# Patient Record
Sex: Female | Born: 1995 | Race: White | Hispanic: No | Marital: Married | Smoking: Never smoker
Health system: Southern US, Community
[De-identification: ages and names within clinical notes are randomized; demographics above are authoritative.]

## PROBLEM LIST (undated history)

## (undated) ENCOUNTER — Inpatient Hospital Stay (HOSPITAL_COMMUNITY): Payer: Self-pay

## (undated) DIAGNOSIS — D649 Anemia, unspecified: Secondary | ICD-10-CM

## (undated) DIAGNOSIS — G43909 Migraine, unspecified, not intractable, without status migrainosus: Secondary | ICD-10-CM

## (undated) DIAGNOSIS — Z789 Other specified health status: Secondary | ICD-10-CM

## (undated) DIAGNOSIS — K219 Gastro-esophageal reflux disease without esophagitis: Secondary | ICD-10-CM

## (undated) HISTORY — PX: NO PAST SURGERIES: SHX2092

---
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
## 2014-06-06 ENCOUNTER — Encounter (HOSPITAL_COMMUNITY): Payer: Self-pay | Admitting: Emergency Medicine

## 2014-06-06 ENCOUNTER — Emergency Department (INDEPENDENT_AMBULATORY_CARE_PROVIDER_SITE_OTHER)
Admission: EM | Admit: 2014-06-06 | Discharge: 2014-06-06 | Disposition: A | Discharge status: Home / Self Care | Payer: Self-pay | Source: Home / Self Care | Attending: Emergency Medicine | Admitting: Emergency Medicine

## 2014-06-06 DIAGNOSIS — J111 Influenza due to unidentified influenza virus with other respiratory manifestations: Secondary | ICD-10-CM

## 2014-06-06 MED ORDER — OSELTAMIVIR PHOSPHATE 75 MG PO CAPS: 75.0000 mg | ORAL_CAPSULE | Freq: Two times a day (BID) | ORAL | Status: DC

## 2014-06-06 NOTE — ED Notes (Signed)
C/o  Acute on set of flu symptoms.  C/o  Cough. Headache. Congestion.  Fever of 102.  And body aches.  Denies diarrhea.    Mild relief with tylenol.

## 2014-06-06 NOTE — Discharge Instructions (Signed)
You have the flu. Take Tamiflu twice a day for the next 5 days. Drink plenty of fluids and get plenty of rest. Alternate Tylenol or ibuprofen every 4 hours for fever and body aches. If you become short of breath, or unable to keep down fluids, or are just getting worse please go to the emergency room.

## 2014-06-06 NOTE — ED Provider Notes (Signed)
CSN: [id number]     Arrival date & time 06/06/14  1622 History   First MD Initiated Contact with Patient 06/06/14 1814     Chief Complaint  Patient presents with  . Influenza   (Consider location/radiation/quality/duration/timing/severity/associated sxs/prior Treatment) HPI  She is a 19 year old woman here for evaluation of flulike symptoms. Her symptoms started yesterday and include cough, nasal congestion, rhinorrhea, sore throat, headache, body aches. She has had fever up to 102.7 at home. She has had some nausea.  No vomiting or diarrhea.  History reviewed. No pertinent past medical history. History reviewed. No pertinent past surgical history. History reviewed. No pertinent family history. History  Substance Use Topics  . Smoking status: Never Smoker   . Smokeless tobacco: Not on file  . Alcohol Use: No   OB History    No data available     Review of Systems  Constitutional: Positive for fever and appetite change.  HENT: Positive for congestion, rhinorrhea and sore throat.   Respiratory: Positive for cough. Negative for shortness of breath.   Gastrointestinal: Positive for nausea. Negative for vomiting and diarrhea.  Musculoskeletal: Positive for myalgias.  Neurological: Positive for headaches.    Allergies  Penicillins  Home Medications   Prior to Admission medications   Medication Sig Start Date End Date Taking? Authorizing Provider  oseltamivir (TAMIFLU) 75 MG capsule Take 1 capsule (75 mg total) by mouth every 12 (twelve) hours. 06/06/14   Charm RingsErin J Honig, MD   BP 195/61 mmHg  Pulse 136  Temp(Src) 100.6 F (38.1 C) (Oral)  Resp 12  SpO2 97%  LMP 05/11/2014 Physical Exam  Constitutional: She is oriented to person, place, and time. She appears well-developed and well-nourished. She appears distressed (looks ill).  HENT:  Head: Normocephalic and atraumatic.  Right Ear: External ear normal.  Left Ear: External ear normal.  Nose: Nose normal.  Mouth/Throat:  Oropharynx is clear and moist. No oropharyngeal exudate.  Neck: Neck supple.  Cardiovascular: Regular rhythm and normal heart sounds.  Tachycardia present.   No murmur heard. Pulmonary/Chest: Effort normal and breath sounds normal. No respiratory distress. She has no wheezes. She has no rales.  Lymphadenopathy:    She has no cervical adenopathy.  Neurological: She is alert and oriented to person, place, and time.    ED Course  Procedures (including critical care time) Labs Review Labs Reviewed - No data to display  Imaging Review No results found.   MDM   1. Influenza    Symptoms are consistent with flu. Will treat with Tamiflu. Tylenol and ibuprofen for fever and body aches. Warning signs reviewed as in after visit summary.    Charm RingsErin J Honig, MD 06/06/14 [phone]

## 2017-04-10 NOTE — L&D Delivery Note (Signed)
Delivery Note Pt became complete at 0451; after intermittent laboring down/pushing, at 9:16 AM a viable female was delivered via Vaginal, Spontaneous (Presentation: ROA).  APGAR: 7, 9; weight: pending. Infant dried and placed on pt's abd; cord clamped and cut by FOB; hospital cord blood sample collected. Placenta status: spont , intact .  Cord: 3 vessel  Anesthesia:  Epidural Episiotomy: None Lacerations: Labial- bilateral Suture Repair: 3.0 vicryl, repaired with 2-3 interrupted sutures each side Est. Blood Loss (mL): 300  Mom to postpartum.  Baby to Couplet care / Skin to Skin.  Cam HaiSHAW, KIMBERLY CNM 10/08/2017, 10:07 AM  Please schedule this patient for Postpartum visit in: 4 weeks with the following provider: Any provider For C/S patients schedule nurse incision check in weeks 2 weeks: no Low risk pregnancy complicated by: none Delivery mode:  SVD Anticipated Birth Control:  POPs vs Depo PP Procedures needed: none  Schedule Integrated BH visit: no

## 2017-04-25 ENCOUNTER — Inpatient Hospital Stay (HOSPITAL_COMMUNITY)
Admission: AD | Admit: 2017-04-25 | Discharge: 2017-04-25 | Disposition: A | Discharge status: Home / Self Care | Payer: Medicaid Other | Source: Ambulatory Visit | Attending: Family Medicine | Admitting: Family Medicine

## 2017-04-25 ENCOUNTER — Encounter (HOSPITAL_COMMUNITY): Payer: Self-pay | Admitting: *Deleted

## 2017-04-25 DIAGNOSIS — O26892 Other specified pregnancy related conditions, second trimester: Secondary | ICD-10-CM | POA: Insufficient documentation

## 2017-04-25 DIAGNOSIS — N949 Unspecified condition associated with female genital organs and menstrual cycle: Secondary | ICD-10-CM

## 2017-04-25 DIAGNOSIS — R109 Unspecified abdominal pain: Secondary | ICD-10-CM | POA: Diagnosis not present

## 2017-04-25 DIAGNOSIS — R102 Pelvic and perineal pain: Secondary | ICD-10-CM | POA: Insufficient documentation

## 2017-04-25 DIAGNOSIS — Z3A17 17 weeks gestation of pregnancy: Secondary | ICD-10-CM | POA: Insufficient documentation

## 2017-04-25 DIAGNOSIS — Z880 Allergy status to penicillin: Secondary | ICD-10-CM | POA: Insufficient documentation

## 2017-04-25 HISTORY — DX: Other specified health status: Z78.9

## 2017-04-25 LAB — URINALYSIS, ROUTINE W REFLEX MICROSCOPIC
Bilirubin Urine: NEGATIVE
Glucose, UA: NEGATIVE mg/dL
Hgb urine dipstick: NEGATIVE
Ketones, ur: NEGATIVE mg/dL
Leukocytes, UA: NEGATIVE
Nitrite: NEGATIVE
Protein, ur: NEGATIVE mg/dL
Specific Gravity, Urine: 1.01 (ref 1.005–1.030)
pH: 7 (ref 5.0–8.0)

## 2017-04-25 NOTE — MAU Provider Note (Signed)
History     CSN: [id number]  Arrival date and time: 04/25/17 1344   First Provider Initiated Contact with Patient 04/25/17 1541      Chief Complaint  Patient presents with  . Abdominal Pain  . Back Pain   HPI   Ms.Helen White is a 22 y.o.female G1P0 @ [redacted]w[redacted]d here in MAU with complaints of lower abdominal pain. The pain started 4 days ago. The pain does not occur every day. The pain worsens when she is up moving around or changing positions in the bed.  No bleeding. The pain is located on both sides of her lower abdomen.  No bleeding or leaking of fluid.   OB History    Gravida Para Term Preterm AB Living   1             SAB TAB Ectopic Multiple Live Births                  Past Medical History:  Diagnosis Date  . Medical history non-contributory     Past Surgical History:  Procedure Laterality Date  . NO PAST SURGERIES      History reviewed. No pertinent family history.  Social History   Tobacco Use  . Smoking status: Never Smoker  . Smokeless tobacco: Never Used  Substance Use Topics  . Alcohol use: No  . Drug use: No    Allergies:  Allergies  Allergen Reactions  . Penicillins     Has patient had a PCN reaction causing immediate rash, facial/tongue/throat swelling, SOB or lightheadedness with hypotension: No Has patient had a PCN reaction causing severe rash involving mucus membranes or skin necrosis: No Has patient had a PCN reaction that required hospitalization: No Has patient had a PCN reaction occurring within the last 10 years: No If all of the above answers are "NO", then may proceed with Cephalosporin use.     Medications Prior to Admission  Medication Sig Dispense Refill Last Dose  . Docosahexaenoic Acid (DHA COMPLETE PO) Take 1 tablet by mouth daily.   04/24/2017 at Unknown time  . Prenatal Vit-Fe Fumarate-FA (PRENATAL MULTIVITAMIN) TABS tablet Take 1 tablet by mouth daily at 12 noon.   04/24/2017 at Unknown time  . acetaminophen  (TYLENOL) 325 MG tablet Take 650 mg by mouth every 6 (six) hours as needed for headache.    04/11/2017   Results for orders placed or performed during the hospital encounter of 04/25/17 (from the past 48 hour(s))  Urinalysis, Routine w reflex microscopic     Status: Abnormal   Collection Time: 04/25/17  2:04 PM  Result Value Ref Range   Color, Urine YELLOW YELLOW   APPearance HAZY (A) CLEAR   Specific Gravity, Urine 1.010 1.005 - 1.030   pH 7.0 5.0 - 8.0   Glucose, UA NEGATIVE NEGATIVE mg/dL   Hgb urine dipstick NEGATIVE NEGATIVE   Bilirubin Urine NEGATIVE NEGATIVE   Ketones, ur NEGATIVE NEGATIVE mg/dL   Protein, ur NEGATIVE NEGATIVE mg/dL   Nitrite NEGATIVE NEGATIVE   Leukocytes, UA NEGATIVE NEGATIVE   Review of Systems  Constitutional: Negative for fever.  Gastrointestinal: Positive for abdominal pain. Negative for constipation, diarrhea, nausea and vomiting.  Genitourinary: Negative for dysuria.   Physical Exam   Blood pressure (!) 109/55, pulse 89, temperature 98.7 F (37.1 C), temperature source Oral, resp. rate 16, height 5\' 3"  (1.6 m), weight 141 lb (64 kg), SpO2 100 %.  Physical Exam  Constitutional: She is oriented to person, place, and  time. She appears well-developed and well-nourished. No distress.  HENT:  Head: Normocephalic.  Eyes: Pupils are equal, round, and reactive to light.  GI: Soft. She exhibits no distension. There is no tenderness. There is no rebound.  Genitourinary:  Genitourinary Comments: Dilation: Closed Effacement (%): Thick Cervical Position: Posterior Exam by:: Cruzita Lipa, NP  Neurological: She is alert and oriented to person, place, and time.  Skin: Skin is warm. She is not diaphoretic.  Psychiatric: Her behavior is normal.   MAU Course  Procedures  None  MDM  + fetal heart tones via doppler Cervix closed   Assessment and Plan   A:  1. Round ligament pain   2. Abdominal pain in pregnancy, second trimester   3. [redacted] weeks gestation of  pregnancy     P:  Discharge home with strict return precautions Pregnancy support belt recommended. Return to MAU if symptoms worsen Follow up with OB as scheduled  Helen White, Harolyn RutherfordJennifer I, NP 04/25/2017 7:59 PM

## 2017-04-25 NOTE — Discharge Instructions (Signed)
Round Ligament Pain During Pregnancy   Round ligament pain is a sharp pain or jabbing feeling often felt in the lower belly or groin area on one or both sides. It is one of the most common complaints during pregnancy and is considered a normal part of pregnancy. It is most often felt during the second trimester.   Here is what you need to know about round ligament pain, including some tips to help you feel better.   Causes of Round Ligament Pain:    Several thick ligaments surround and support your womb (uterus) as it grows during pregnancy. One of them is called the round ligament.   The round ligament connects the front part of the womb to your groin, the area where your legs attach to your pelvis. The round ligament normally tightens and relaxes slowly.   As your baby and womb grow, the round ligament stretches. That makes it more likely to become strained.   Sudden movements can cause the ligament to tighten quickly, like a rubber band snapping. This causes a sudden and quick jabbing feeling.   Symptoms of Round Ligament Pain   Round ligament pain can be concerning and uncomfortable. But it is considered normal as your body changes during pregnancy.   The symptoms of round ligament pain include a sharp, sudden spasm in the belly. It usually affects the right side, but it may happen on both sides. The pain only lasts a few seconds.   Exercise may cause the pain, as will rapid movements such as:   sneezing  coughing  laughing  rolling over in bed  standing up too quickly   Treatment of Round Ligament Pain   Here are some tips that may help reduce your discomfort:   Pain relief. Take over-the-counter acetaminophen for pain, if necessary. Ask your doctor if this is OK.   Exercise. Get plenty of exercise to keep your stomach (core) muscles strong. Doing stretching exercises or prenatal yoga can be helpful. Ask your doctor which exercises are safe for you and your baby.   A  helpful exercise involves putting your hands and knees on the floor, lowering your head, and pushing your backside into the air.   Avoid sudden movements. Change positions slowly (such as standing up or sitting down) to avoid sudden movements that may cause stretching and pain.   Flex your hips. Bend and flex your hips before you cough, sneeze, or laugh to avoid pulling on the ligaments.   Apply warmth. A heating pad or warm bath may be helpful. Ask your doctor if this is OK. Extreme heat can be dangerous to the baby.   You should try to modify your daily activity level and avoid positions that may worsen the condition.   When to Call the Doctor/Midwife   Always tell your doctor or midwife about any type of pain you have during pregnancy. Round ligament pain is quick and doesn't last long.   Call your health care provider immediately if you have:   severe pain  fever  chills  pain on urination  difficulty walking   Belly pain during pregnancy can be due to many different causes. It is important for your doctor to rule out more serious conditions, including pregnancy complications such as placenta abruption or non-pregnancy illnesses such as:   inguinal hernia  appendicitis  stomach, liver, and kidney problems  Preterm labor pains may sometimes be mistaken for round ligament pain.  l °

## 2017-04-25 NOTE — MAU Note (Signed)
Pt reports lower abd pain x 4 days ago, pain is now radiating to her lower back.

## 2017-05-02 LAB — OB RESULTS CONSOLE ANTIBODY SCREEN: ANTIBODY SCREEN: NEGATIVE

## 2017-05-02 LAB — OB RESULTS CONSOLE ABO/RH: RH TYPE: POSITIVE

## 2017-05-02 LAB — CYSTIC FIBROSIS DIAGNOSTIC STUDY: INTERPRETATION-CFDNA: NEGATIVE

## 2017-05-03 LAB — OB RESULTS CONSOLE GC/CHLAMYDIA
Chlamydia: NEGATIVE
Gonorrhea: NEGATIVE

## 2017-05-09 LAB — OB RESULTS CONSOLE HIV ANTIBODY (ROUTINE TESTING): HIV: NONREACTIVE

## 2017-05-09 LAB — OB RESULTS CONSOLE RPR: RPR: NONREACTIVE

## 2017-05-09 LAB — OB RESULTS CONSOLE HGB/HCT, BLOOD
HEMATOCRIT: 39
HEMOGLOBIN: 12.6

## 2017-05-09 LAB — OB RESULTS CONSOLE HEPATITIS B SURFACE ANTIGEN: HEP B S AG: NEGATIVE

## 2017-05-09 LAB — OB RESULTS CONSOLE PLATELET COUNT: PLATELETS: 261

## 2017-05-09 LAB — OB RESULTS CONSOLE RUBELLA ANTIBODY, IGM: RUBELLA: IMMUNE

## 2017-05-09 LAB — OB RESULTS CONSOLE VARICELLA ZOSTER ANTIBODY, IGG: Varicella: IMMUNE

## 2017-05-11 ENCOUNTER — Encounter: Payer: Self-pay | Admitting: Obstetrics

## 2017-05-11 ENCOUNTER — Ambulatory Visit (INDEPENDENT_AMBULATORY_CARE_PROVIDER_SITE_OTHER): Payer: Self-pay | Admitting: Obstetrics

## 2017-05-11 VITALS — BP 94/69 | HR 116 | Wt 142.0 lb

## 2017-05-11 DIAGNOSIS — Z3400 Encounter for supervision of normal first pregnancy, unspecified trimester: Secondary | ICD-10-CM | POA: Insufficient documentation

## 2017-05-11 DIAGNOSIS — Z3402 Encounter for supervision of normal first pregnancy, second trimester: Secondary | ICD-10-CM

## 2017-05-11 NOTE — Progress Notes (Signed)
Pt [facility] Pt had Blood Drawn as well per pt she had everything including Genetic testing Pt states [facility] a [facility]02/17 [facility]  Declined Genetic Testing today..  Per pt and spouse they were ok with getting pregnant. Pt husband mom has :polycthemia vera

## 2017-05-11 NOTE — Progress Notes (Signed)
Subjective:    Helen White [facility] is being seen today for her first obstetrical visit.  This is a planned pregnancy. She is at [redacted]w[redacted]d gestation. Her obstetrical history is significant for none. Relationship with FOB: spouse, living together. Patient does intend to breast feed. Pregnancy history fully reviewed.  The information documented in the HPI was reviewed and verified.  Menstrual History: OB History    Gravida Para Term Preterm AB Living   1             SAB TAB Ectopic Multiple Live Births                   No LMP recorded. Patient is pregnant.    Past Medical History:  Diagnosis Date  . Medical history non-contributory     Past Surgical History:  Procedure Laterality Date  . NO PAST SURGERIES       (Not in a hospital admission) Allergies  Allergen Reactions  . Penicillins     Has patient had a PCN reaction causing immediate rash, facial/tongue/throat swelling, SOB or lightheadedness with hypotension: No Has patient had a PCN reaction causing severe rash involving mucus membranes or skin necrosis: No Has patient had a PCN reaction that required hospitalization: No Has patient had a PCN reaction occurring within the last 10 years: No If all of the above answers are "NO", then may proceed with Cephalosporin use.     Social History   Tobacco Use  . Smoking status: Never Smoker  . Smokeless tobacco: Never Used  Substance Use Topics  . Alcohol use: No    History reviewed. No pertinent family history.   Review of Systems Constitutional: negative for weight loss Gastrointestinal: negative for vomiting Genitourinary:negative for genital lesions and vaginal discharge and dysuria Musculoskeletal:negative for back pain Behavioral/Psych: negative for abusive relationship, depression, illegal drug usage and tobacco use    Objective:    BP 94/69   Pulse (!) 116   Wt 142 lb (64.4 kg)   BMI 25.15 kg/m  General Appearance:    Alert, cooperative, no distress, appears  stated age  Head:    Normocephalic, without obvious abnormality, atraumatic  Eyes:    PERRL, conjunctiva/corneas clear, EOM's intact, fundi    benign, both eyes  Ears:    Normal TM's and external ear canals, both ears  Nose:   Nares normal, septum midline, mucosa normal, no drainage    or sinus tenderness  Throat:   Lips, mucosa, and tongue normal; teeth and gums normal  Neck:   Supple, symmetrical, trachea midline, no adenopathy;    thyroid:  no enlargement/tenderness/nodules; no carotid   bruit or JVD  Back:     Symmetric, no curvature, ROM normal, no CVA tenderness  Lungs:     Clear to auscultation bilaterally, respirations unlabored  Chest Wall:    No tenderness or deformity   Heart:    Regular rate and rhythm, S1 and S2 normal, no murmur, rub   or gallop  Breast Exam:    No tenderness, masses, or nipple abnormality  Abdomen:     Soft, non-tender, bowel sounds active all four quadrants,    no masses, no organomegaly     Lab Review Urine pregnancy test Labs reviewed yes Radiologic studies reviewed yes  Assessment:    Pregnancy at [redacted]w[redacted]d weeks    Plan:     1. Supervision of normal first pregnancy, antepartum Rx: - [place] MFM OB COMP + 14 WK; Future  Prenatal vitamins.  Counseling provided regarding continued use of seat belts, cessation of alcohol consumption, smoking or use of illicit drugs; infection precautions i.e., influenza/TDAP immunizations, toxoplasmosis,CMV, parvovirus, listeria and varicella; workplace safety, exercise during pregnancy; routine dental care, safe medications, sexual activity, hot tubs, saunas, pools, travel, caffeine use, fish and methlymercury, potential toxins, hair treatments, varicose veins Weight gain recommendations per IOM guidelines reviewed: underweight/BMI< 18.5--> gain 28 - 40 lbs; normal weight/BMI 18.5 - 24.9--> gain 25 - 35 lbs; overweight/BMI 25 - 29.9--> gain 15 - 25 lbs; obese/BMI >30->gain  11 - 20 lbs Problem list reviewed and  updated. FIRST/CF mutation testing/NIPT/QUAD SCREEN/fragile X/Ashkenazi Jewish population testing/Spinal muscular atrophy discussed: requested. Role of ultrasound in pregnancy discussed; fetal survey: requested. Amniocentesis discussed: not indicated.  No orders of the defined types were placed in this encounter.  Orders Placed This Encounter  Procedures  . US MFM OB COMP + 14 WK    Standing Status:   Future    Standing Expiration Date:   07/10/2018    Order Specific Question:   Reason for Exam (SYMPTOM  OR DIAGNOSIS REQUIRED)    Answer:   Anatomy    Order Specific Question:   Preferred Imaging Location?    Answer:   [facility]    Follow up in 4 weeks. 50% of 20 min visit spent on counseling and coordination of care.    Brock BadHARLES A. HARPER MD

## 2017-05-23 ENCOUNTER — Encounter (HOSPITAL_COMMUNITY): Payer: Self-pay | Admitting: Obstetrics

## 2017-05-29 ENCOUNTER — Other Ambulatory Visit: Payer: Self-pay | Admitting: Obstetrics

## 2017-05-29 ENCOUNTER — Ambulatory Visit (HOSPITAL_COMMUNITY)
Admission: RE | Admit: 2017-05-29 | Discharge: 2017-05-29 | Disposition: A | Discharge status: Home / Self Care | Payer: Self-pay | Source: Ambulatory Visit | Attending: Obstetrics | Admitting: Obstetrics

## 2017-05-29 DIAGNOSIS — Z3689 Encounter for other specified antenatal screening: Secondary | ICD-10-CM

## 2017-05-29 DIAGNOSIS — Z3A22 22 weeks gestation of pregnancy: Secondary | ICD-10-CM

## 2017-05-29 DIAGNOSIS — Z3400 Encounter for supervision of normal first pregnancy, unspecified trimester: Secondary | ICD-10-CM

## 2017-06-11 ENCOUNTER — Ambulatory Visit (INDEPENDENT_AMBULATORY_CARE_PROVIDER_SITE_OTHER): Payer: Self-pay | Admitting: Obstetrics

## 2017-06-11 ENCOUNTER — Encounter: Payer: Self-pay | Admitting: Obstetrics

## 2017-06-11 VITALS — BP 112/74 | HR 118 | Wt 154.9 lb

## 2017-06-11 DIAGNOSIS — Z3402 Encounter for supervision of normal first pregnancy, second trimester: Secondary | ICD-10-CM

## 2017-06-11 DIAGNOSIS — Z3400 Encounter for supervision of normal first pregnancy, unspecified trimester: Secondary | ICD-10-CM

## 2017-06-11 DIAGNOSIS — R12 Heartburn: Secondary | ICD-10-CM

## 2017-06-11 DIAGNOSIS — O26892 Other specified pregnancy related conditions, second trimester: Secondary | ICD-10-CM

## 2017-06-11 DIAGNOSIS — O26899 Other specified pregnancy related conditions, unspecified trimester: Secondary | ICD-10-CM

## 2017-06-11 MED ORDER — RANITIDINE HCL 150 MG PO TABS: 150.0000 mg | ORAL_TABLET | Freq: Two times a day (BID) | ORAL | 5 refills | Status: DC

## 2017-06-11 NOTE — Progress Notes (Signed)
Subjective:  [facility] 22 y.[facility] G1P0 at 3173w0d being seen today for ongoing prenatal care.  She is currently monitored for the following issues for this low-risk pregnancy and has Supervision of normal first pregnancy, antepartum on their problem list.  Patient reports heartburn.  Contractions: Not present. Vag. Bleeding: None.  Movement: Present. Denies leaking of fluid.   The following portions of the patient's history were reviewed and updated as appropriate: allergies, current medications, past family history, past medical history, past social history, past surgical history and problem list. Problem list updated.  Objective:   Vitals:   06/11/17 0922  BP: 112/74  Pulse: (!) 118  Weight: 154 lb 14.4 oz (70.3 kg)    Fetal Status: Fetal Heart Rate (bpm): 140   Movement: Present     General:  Alert, oriented and cooperative. Patient is in no acute distress.  Skin: Skin is warm and dry. No rash noted.   Cardiovascular: Normal heart rate noted  Respiratory: Normal respiratory effort, no problems with respiration noted  Abdomen: Soft, gravid, appropriate for gestational age. Pain/Pressure: Absent     Pelvic:  Cervical exam deferred        Extremities: Normal range of motion.  Edema: None  Mental Status: Normal mood and affect. Normal behavior. Normal judgment and thought content.   Urinalysis:      Assessment and Plan:  Pregnancy: G1P0 at 5873w0d  1. Supervision of normal first pregnancy, antepartum  2. Heartburn during pregnancy, antepartum Rx: - ranitidine (ZANTAC) 150 MG tablet; Take 1 tablet (150 mg total) by mouth 2 (two) times daily.  Dispense: 60 tablet; Refill: 5  Preterm labor symptoms and general obstetric precautions including but not limited to vaginal bleeding, contractions, leaking of fluid and fetal movement were reviewed in detail with the patient. Please refer to After Visit Summary for other counseling recommendations.  Return in about 4 weeks (around  07/09/2017) for ROB, 2 hour OGTT.   Brock BadHarper, Dineen Conradt A, MD

## 2017-06-14 ENCOUNTER — Ambulatory Visit (HOSPITAL_COMMUNITY)
Admission: EM | Admit: 2017-06-14 | Discharge: 2017-06-14 | Disposition: A | Discharge status: Home / Self Care | Payer: Self-pay | Attending: Family Medicine | Admitting: Family Medicine

## 2017-06-14 ENCOUNTER — Encounter (HOSPITAL_COMMUNITY): Payer: Self-pay | Admitting: Family Medicine

## 2017-06-14 DIAGNOSIS — J029 Acute pharyngitis, unspecified: Secondary | ICD-10-CM | POA: Insufficient documentation

## 2017-06-14 DIAGNOSIS — Z880 Allergy status to penicillin: Secondary | ICD-10-CM | POA: Insufficient documentation

## 2017-06-14 LAB — POCT RAPID STREP A: Streptococcus, Group A Screen (Direct): NEGATIVE

## 2017-06-14 NOTE — Discharge Instructions (Signed)
You may use over the counter acetaminophen as needed.  For a sore throat, over the counter products such as [employer] Peroxyl Mouth Sore Rinse or Chloraseptic Sore Throat Spray may provide some temporary relief. Your rapid strep test was negative today. We have sent your throat swab for culture and will let you know of any positive results.

## 2017-06-14 NOTE — ED Provider Notes (Signed)
  [facility]   [id number] 06/14/17 Arrival Time: 1234  ASSESSMENT & PLAN:  1. Sore throat     Results for orders placed or performed during the hospital encounter of 06/14/17  POCT rapid strep A [facility])  Result Value Ref Range   Streptococcus, Group A Screen (Direct) NEGATIVE NEGATIVE   Labs Reviewed  CULTURE, GROUP A STREP [facility])  POCT RAPID STREP A   OTC analgesics and throat care as needed Will follow up with PCP or here if worsening or failing to improve as anticipated.  Reviewed expectations re: course of current medical issues. Questions answered. Outlined signs and symptoms indicating need for more acute intervention. Patient verbalized understanding. After Visit Summary given.   SUBJECTIVE:  Helen PlantCheyenne Lynn Fiebelkorn is a 22 y.o. female who reports a sore throat. Describes as discomfort with swallowing. Onset gradual beginning 3 days ago. No respiratory symptoms. Normal PO intake but reports discomfort with swallowing. Fever reported: yes. No associated n/v/abdominal symptoms. Sick contacts: none.   OTC treatment: Tylenol with mild help.  ROS: As per HPI.   OBJECTIVE:  Vitals:   06/14/17 1251  BP: 111/70  Pulse: 100  Resp: 18  Temp: 98.7 F (37.1 C)  SpO2: 98%     General appearance: alert; no distress HEENT: throat with mild erythema and cobblestoning; uvula midline Neck: supple with FROM; no cervical LAD Lungs: clear to auscultation bilaterally Skin: reveals no rash; warm and dry Psychological: alert and cooperative; normal mood and affect  Allergies  Allergen Reactions  . Penicillins     Has patient had a PCN reaction causing immediate rash, facial/tongue/throat swelling, SOB or lightheadedness with hypotension: No Has patient had a PCN reaction causing severe rash involving mucus membranes or skin necrosis: No Has patient had a PCN reaction that required hospitalization: No Has patient had a PCN reaction occurring within the last  10 years: No If all of the above answers are "NO", then may proceed with Cephalosporin use.     Past Medical History:  Diagnosis Date  . Medical history non-contributory    Social History   Socioeconomic History  . Marital status: Married    Spouse name: Not on file  . Number of children: Not on file  . Years of education: Not on file  . Highest education level: Not on file  Social Needs  . Financial resource strain: Not on file  . Food insecurity - worry: Not on file  . Food insecurity - inability: Not on file  . Transportation needs - medical: Not on file  . Transportation needs - non-medical: Not on file  Occupational History  . Not on file  Tobacco Use  . Smoking status: Never Smoker  . Smokeless tobacco: Never Used  Substance and Sexual Activity  . Alcohol use: No  . Drug use: No  . Sexual activity: Yes  Other Topics Concern  . Not on file  Social History Narrative  . Not on file           Mardella LaymanHagler, [place] Kosch, MD 06/14/17 1328

## 2017-06-14 NOTE — ED Triage Notes (Signed)
Pt here for sore throat x 3 days. sts fever this am.

## 2017-06-17 LAB — CULTURE, GROUP A STREP (THRC)

## 2017-06-18 ENCOUNTER — Encounter: Payer: Self-pay | Admitting: Obstetrics

## 2017-07-09 ENCOUNTER — Encounter: Payer: Self-pay | Admitting: Obstetrics

## 2017-07-09 ENCOUNTER — Ambulatory Visit (INDEPENDENT_AMBULATORY_CARE_PROVIDER_SITE_OTHER): Payer: Self-pay | Admitting: Obstetrics

## 2017-07-09 ENCOUNTER — Other Ambulatory Visit: Payer: Self-pay

## 2017-07-09 VITALS — BP 102/66 | HR 103 | Wt 162.9 lb

## 2017-07-09 DIAGNOSIS — Z3400 Encounter for supervision of normal first pregnancy, unspecified trimester: Secondary | ICD-10-CM

## 2017-07-09 DIAGNOSIS — Z72820 Sleep deprivation: Secondary | ICD-10-CM

## 2017-07-09 DIAGNOSIS — Z23 Encounter for immunization: Secondary | ICD-10-CM

## 2017-07-09 DIAGNOSIS — Z3403 Encounter for supervision of normal first pregnancy, third trimester: Secondary | ICD-10-CM

## 2017-07-09 MED ORDER — ZOLPIDEM TARTRATE 5 MG PO TABS: 5.0000 mg | ORAL_TABLET | Freq: Every evening | ORAL | 0 refills | Status: DC | PRN

## 2017-07-09 NOTE — Progress Notes (Signed)
Subjective:  [facility]en [facility] is a 22 y.o. G1P0 at 2955w0d being seen today for ongoing prenatal care.  She is currently monitored for the following issues for this low-risk pregnancy and has Supervision of normal first pregnancy, antepartum on their problem list.  Patient reports not sleeping well.  Contractions: Not present. Vag. Bleeding: None.  Movement: Present. Denies leaking of fluid.   The following portions of the patient's history were reviewed and updated as appropriate: allergies, current medications, past family history, past medical history, past social history, past surgical history and problem list. Problem list updated.  Objective:   Vitals:   07/09/17 0826  BP: 102/66  Pulse: (!) 103  Weight: 162 lb 14.4 oz (73.9 kg)    Fetal Status: Fetal Heart Rate (bpm): 140   Movement: Present     General:  Alert, oriented and cooperative. Patient is in no acute distress.  Skin: Skin is warm and dry. No rash noted.   Cardiovascular: Normal heart rate noted  Respiratory: Normal respiratory effort, no problems with respiration noted  Abdomen: Soft, gravid, appropriate for gestational age. Pain/Pressure: Absent     Pelvic:  Cervical exam deferred        Extremities: Normal range of motion.  Edema: None  Mental Status: Normal mood and affect. Normal behavior. Normal judgment and thought content.   Urinalysis:      Assessment and Plan:  Pregnancy: G1P0 at 7655w0d  1. Supervision of normal first pregnancy, antepartum Rx: - Glucose Tolerance, 2 Hours w/1 Hour - CBC - HIV antibody (with reflex) - RPR - Tdap vaccine greater than or equal to 7yo IM  2. Sleep deprivation Rx: - zolpidem (AMBIEN) 5 MG tablet; Take 1 tablet (5 mg total) by mouth at bedtime as needed for sleep.  Dispense: 15 tablet; Refill: 0    Preterm labor symptoms and general obstetric precautions including but not limited to vaginal bleeding, contractions, leaking of fluid and fetal movement were reviewed in  detail with the patient. Please refer to After Visit Summary for other counseling recommendations.  Return in about 2 weeks (around 07/23/2017) for ROB.   Brock BadHarper, Danny Yackley A, MD

## 2017-07-09 NOTE — Progress Notes (Signed)
C/o not being able to sleep, wants Rx.  TDAP given in left deltoid, tolerated well. Patient [facility]

## 2017-07-10 ENCOUNTER — Other Ambulatory Visit: Payer: Self-pay

## 2017-07-10 LAB — CBC
HEMOGLOBIN: 12 g/dL (ref 11.1–15.9)
Hematocrit: 36.3 % (ref 34.0–46.6)
MCH: 29.9 pg (ref 26.6–33.0)
MCHC: 33.1 g/dL (ref 31.5–35.7)
MCV: 91 fL (ref 79–97)
Platelets: 236 10*3/uL (ref 150–379)
RBC: 4.01 x10E6/uL (ref 3.77–5.28)
RDW: 14.2 % (ref 12.3–15.4)
WBC: 9.7 10*3/uL (ref 3.4–10.8)

## 2017-07-10 LAB — HIV ANTIBODY (ROUTINE TESTING W REFLEX): HIV Screen 4th Generation wRfx: NONREACTIVE

## 2017-07-10 LAB — GLUCOSE TOLERANCE, 2 HOURS W/ 1HR
Glucose, 1 hour: 106 mg/dL (ref 65–179)
Glucose, 2 hour: 86 mg/dL (ref 65–152)
Glucose, Fasting: 80 mg/dL (ref 65–91)

## 2017-07-10 LAB — RPR: RPR Ser Ql: NONREACTIVE

## 2017-07-23 ENCOUNTER — Encounter: Payer: Self-pay | Admitting: Obstetrics

## 2017-07-23 ENCOUNTER — Ambulatory Visit (INDEPENDENT_AMBULATORY_CARE_PROVIDER_SITE_OTHER): Payer: Self-pay | Admitting: Obstetrics

## 2017-07-23 VITALS — BP 109/63 | HR 91 | Wt 166.6 lb

## 2017-07-23 DIAGNOSIS — Z3400 Encounter for supervision of normal first pregnancy, unspecified trimester: Secondary | ICD-10-CM

## 2017-07-23 DIAGNOSIS — Z3403 Encounter for supervision of normal first pregnancy, third trimester: Secondary | ICD-10-CM

## 2017-07-23 DIAGNOSIS — J301 Allergic rhinitis due to pollen: Secondary | ICD-10-CM

## 2017-07-23 MED ORDER — LORATADINE 10 MG PO TABS: 10.0000 mg | ORAL_TABLET | Freq: Every day | ORAL | 11 refills | Status: DC

## 2017-07-23 NOTE — Progress Notes (Signed)
Subjective:  Helen White is a 22 y.o. G1P0 at 8062w0d being seen today for ongoing prenatal care.  She is currently monitored for the following issues for this low-risk pregnancy and has Supervision of normal first pregnancy, antepartum on their problem list.  Patient reports seasonal allergic rhinitis.  Contractions: Not present. Vag. Bleeding: None.  Movement: Present. Denies leaking of fluid.   The following portions of the patient's history were reviewed and updated as appropriate: allergies, current medications, past family history, past medical history, past social history, past surgical history and problem list. Problem list updated.  Objective:   Vitals:   07/23/17 0856  BP: 109/63  Pulse: 91  Weight: 166 lb 9.6 oz (75.6 kg)    Fetal Status:     Movement: Present     General:  Alert, oriented and cooperative. Patient is in no acute distress.  Skin: Skin is warm and dry. No rash noted.   Cardiovascular: Normal heart rate noted  Respiratory: Normal respiratory effort, no problems with respiration noted  Abdomen: Soft, gravid, appropriate for gestational age. Pain/Pressure: Absent     Pelvic:  Cervical exam deferred        Extremities: Normal range of motion.  Edema: None  Mental Status: Normal mood and affect. Normal behavior. Normal judgment and thought content.   Urinalysis:      Assessment and Plan:  Pregnancy: G1P0 at 3762w0d  1. Supervision of normal first pregnancy, antepartum   2. Seasonal allergic rhinitis due to pollen Rx: - loratadine (CLARITIN) 10 MG tablet; Take 1 tablet (10 mg total) by mouth daily.  Dispense: 30 tablet; Refill: 11  Preterm labor symptoms and general obstetric precautions including but not limited to vaginal bleeding, contractions, leaking of fluid and fetal movement were reviewed in detail with the patient. Please refer to After Visit Summary for other counseling recommendations.  Return in about 2 weeks (around 08/06/2017) for  ROB.   Brock BadHarper, Helen White A, MD

## 2017-08-06 ENCOUNTER — Ambulatory Visit (INDEPENDENT_AMBULATORY_CARE_PROVIDER_SITE_OTHER): Payer: Self-pay | Admitting: Obstetrics

## 2017-08-06 ENCOUNTER — Encounter: Payer: Self-pay | Admitting: Obstetrics

## 2017-08-06 DIAGNOSIS — Z3403 Encounter for supervision of normal first pregnancy, third trimester: Secondary | ICD-10-CM

## 2017-08-06 DIAGNOSIS — Z3400 Encounter for supervision of normal first pregnancy, unspecified trimester: Secondary | ICD-10-CM

## 2017-08-06 NOTE — Progress Notes (Signed)
Subjective:  Helen White is a 22 y.o. G1P0 at [redacted]w[redacted]d being seen today for ongoing prenatal care.  She is currently monitored for the following issues for this low-risk pregnancy and has Supervision of normal first pregnancy, antepartum on their problem list.  Patient reports no complaints.  Contractions: Irregular. Vag. Bleeding: None.  Movement: Present. Denies leaking of fluid.   The following portions of the patient's history were reviewed and updated as appropriate: allergies, current medications, past family history, past medical history, past social history, past surgical history and problem list. Problem list updated.  Objective:   Vitals:   08/06/17 0928  BP: 102/70  Pulse: 96  Weight: 173 lb 4.8 oz (78.6 kg)    Fetal Status: Fetal Heart Rate (bpm): 140   Movement: Present     General:  Alert, oriented and cooperative. Patient is in no acute distress.  Skin: Skin is warm and dry. No rash noted.   Cardiovascular: Normal heart rate noted  Respiratory: Normal respiratory effort, no problems with respiration noted  Abdomen: Soft, gravid, appropriate for gestational age. Pain/Pressure: Absent     Pelvic:  Cervical exam deferred        Extremities: Normal range of motion.  Edema: None  Mental Status: Normal mood and affect. Normal behavior. Normal judgment and thought content.   Urinalysis:      Assessment and Plan:  Pregnancy: G1P0 at [redacted]w[redacted]d  1. Supervision of normal first pregnancy, antepartum   Preterm labor symptoms and general obstetric precautions including but not limited to vaginal bleeding, contractions, leaking of fluid and fetal movement were reviewed in detail with the patient. Please refer to After Visit Summary for other counseling recommendations.  Return in about 2 weeks (around 08/20/2017) for ROB.   Brock Bad, MD

## 2017-08-06 NOTE — Progress Notes (Signed)
Patient reports good fetal movement, denies pain. 

## 2017-08-20 ENCOUNTER — Other Ambulatory Visit: Payer: Self-pay

## 2017-08-20 ENCOUNTER — Ambulatory Visit (INDEPENDENT_AMBULATORY_CARE_PROVIDER_SITE_OTHER): Payer: Self-pay | Admitting: Obstetrics

## 2017-08-20 ENCOUNTER — Encounter: Payer: Self-pay | Admitting: Obstetrics

## 2017-08-20 VITALS — BP 114/65 | HR 119 | Wt 180.0 lb

## 2017-08-20 DIAGNOSIS — Z0489 Encounter for examination and observation for other specified reasons: Secondary | ICD-10-CM

## 2017-08-20 DIAGNOSIS — Z3403 Encounter for supervision of normal first pregnancy, third trimester: Secondary | ICD-10-CM

## 2017-08-20 DIAGNOSIS — Z3400 Encounter for supervision of normal first pregnancy, unspecified trimester: Secondary | ICD-10-CM

## 2017-08-20 DIAGNOSIS — IMO0002 Reserved for concepts with insufficient information to code with codable children: Secondary | ICD-10-CM

## 2017-08-20 NOTE — Addendum Note (Signed)
Addended by: Coral Ceo A on: 08/20/2017 10:23 AM   Modules accepted: Orders

## 2017-08-20 NOTE — Progress Notes (Signed)
ROB, no problems today. Subjective:  Helen White [facility] is a 22 y.o. G1P0 at [redacted]w[redacted]d being seen today for ongoing prenatal care.  She is currently monitored for the following issues for this low-risk pregnancy and has Supervision of normal first pregnancy, antepartum on their problem list.  Patient reports no complaints.  Contractions: Not present. Vag. Bleeding: None.  Movement: Present. Denies leaking of fluid.   The following portions of the patient's history were reviewed and updated as appropriate: allergies, current medications, past family history, past medical history, past social history, past surgical history and problem list. Problem list updated.  Objective:   Vitals:   08/20/17 0942  BP: 114/65  Pulse: (!) 119  Weight: 180 lb (81.6 kg)    Fetal Status: Fetal Heart Rate (bpm): 140   Movement: Present     General:  Alert, oriented and cooperative. Patient is in no acute distress.  Skin: Skin is warm and dry. No rash noted.   Cardiovascular: Normal heart rate noted  Respiratory: Normal respiratory effort, no problems with respiration noted  Abdomen: Soft, gravid, appropriate for gestational age. Pain/Pressure: Present     Pelvic:  Cervical exam deferred        Extremities: Normal range of motion.  Edema: None  Mental Status: Normal mood and affect. Normal behavior. Normal judgment and thought content.   Urinalysis:      Assessment and Plan:  Pregnancy: G1P0 at [redacted]w[redacted]d  1. Supervision of normal first pregnancy, antepartum  2. Evaluate anatomy not seen on prior sonogram Rx: - [place] MFM OB COMP + 14 WK; Future  Preterm labor symptoms and general obstetric precautions including but not limited to vaginal bleeding, contractions, leaking of fluid and fetal movement were reviewed in detail with the patient. Please refer to After Visit Summary for other counseling recommendations.  Return in about 2 weeks (around 09/03/2017) for ROB.   Brock Bad, MD

## 2017-09-05 ENCOUNTER — Encounter: Payer: Self-pay | Admitting: Obstetrics

## 2017-09-06 ENCOUNTER — Encounter: Payer: Self-pay | Admitting: Obstetrics

## 2017-09-06 ENCOUNTER — Ambulatory Visit (INDEPENDENT_AMBULATORY_CARE_PROVIDER_SITE_OTHER): Payer: Medicaid Other | Admitting: Obstetrics

## 2017-09-06 ENCOUNTER — Other Ambulatory Visit (HOSPITAL_COMMUNITY)
Admission: RE | Admit: 2017-09-06 | Discharge: 2017-09-06 | Disposition: A | Discharge status: Home / Self Care | Payer: Medicaid Other | Source: Ambulatory Visit | Attending: Obstetrics | Admitting: Obstetrics

## 2017-09-06 ENCOUNTER — Ambulatory Visit (HOSPITAL_COMMUNITY)
Admission: RE | Admit: 2017-09-06 | Discharge: 2017-09-06 | Disposition: A | Discharge status: Home / Self Care | Payer: Medicaid Other | Source: Ambulatory Visit | Attending: Obstetrics | Admitting: Obstetrics

## 2017-09-06 ENCOUNTER — Other Ambulatory Visit: Payer: Self-pay | Admitting: Obstetrics

## 2017-09-06 VITALS — BP 109/71 | HR 108 | Wt 185.4 lb

## 2017-09-06 DIAGNOSIS — Z0489 Encounter for examination and observation for other specified reasons: Secondary | ICD-10-CM | POA: Insufficient documentation

## 2017-09-06 DIAGNOSIS — Z362 Encounter for other antenatal screening follow-up: Secondary | ICD-10-CM

## 2017-09-06 DIAGNOSIS — Z3403 Encounter for supervision of normal first pregnancy, third trimester: Secondary | ICD-10-CM

## 2017-09-06 DIAGNOSIS — IMO0002 Reserved for concepts with insufficient information to code with codable children: Secondary | ICD-10-CM

## 2017-09-06 DIAGNOSIS — Z3A36 36 weeks gestation of pregnancy: Secondary | ICD-10-CM

## 2017-09-06 DIAGNOSIS — Z3400 Encounter for supervision of normal first pregnancy, unspecified trimester: Secondary | ICD-10-CM | POA: Diagnosis present

## 2017-09-06 LAB — OB RESULTS CONSOLE GBS: GBS: NEGATIVE

## 2017-09-06 NOTE — Progress Notes (Signed)
Subjective:  Helen White [facility] is a 22 y.o. G1P0 at [redacted]w[redacted]d being seen today for ongoing prenatal care.  She is currently monitored for the following issues for this low-risk pregnancy and has Supervision of normal first pregnancy, antepartum on their problem list.  Patient reports no complaints.  Contractions: Irregular. Vag. Bleeding: None.  Movement: Present. Denies leaking of fluid.   The following portions of the patient's history were reviewed and updated as appropriate: allergies, current medications, past family history, past medical history, past social history, past surgical history and problem list. Problem list updated.  Objective:   Vitals:   09/06/17 0905  BP: 109/71  Pulse: (!) 108  Weight: 185 lb 6.4 oz (84.1 kg)    Fetal Status: Fetal Heart Rate (bpm): 140   Movement: Present     General:  Alert, oriented and cooperative. Patient is in no acute distress.  Skin: Skin is warm and dry. No rash noted.   Cardiovascular: Normal heart rate noted  Respiratory: Normal respiratory effort, no problems with respiration noted  Abdomen: Soft, gravid, appropriate for gestational age. Pain/Pressure: Present     Pelvic:  Cervical exam deferred        Extremities: Normal range of motion.  Edema: Trace  Mental Status: Normal mood and affect. Normal behavior. Normal judgment and thought content.   Urinalysis:      Assessment and Plan:  Pregnancy: G1P0 at [redacted]w[redacted]d  1. Supervision of normal first pregnancy, antepartum Rx: - Strep Gp B Culture+Rflx - Cervicovaginal ancillary only   Preterm labor symptoms and general obstetric precautions including but not limited to vaginal bleeding, contractions, leaking of fluid and fetal movement were reviewed in detail with the patient. Please refer to After Visit Summary for other counseling recommendations.  Return in about 2 weeks (around 09/20/2017) for ROB.   Brock Bad, MD

## 2017-09-06 NOTE — Progress Notes (Signed)
0

## 2017-09-07 ENCOUNTER — Inpatient Hospital Stay (HOSPITAL_COMMUNITY)
Admission: AD | Admit: 2017-09-07 | Discharge: 2017-09-07 | Disposition: A | Discharge status: Home / Self Care | Payer: Medicaid Other | Source: Ambulatory Visit | Attending: Obstetrics and Gynecology | Admitting: Obstetrics and Gynecology

## 2017-09-07 ENCOUNTER — Encounter (HOSPITAL_COMMUNITY): Payer: Self-pay | Admitting: *Deleted

## 2017-09-07 DIAGNOSIS — O479 False labor, unspecified: Secondary | ICD-10-CM

## 2017-09-07 DIAGNOSIS — O471 False labor at or after 37 completed weeks of gestation: Secondary | ICD-10-CM | POA: Diagnosis not present

## 2017-09-07 DIAGNOSIS — Z3A37 37 weeks gestation of pregnancy: Secondary | ICD-10-CM | POA: Insufficient documentation

## 2017-09-07 LAB — CERVICOVAGINAL ANCILLARY ONLY
Chlamydia: NEGATIVE
Neisseria Gonorrhea: NEGATIVE
Trichomonas: NEGATIVE

## 2017-09-07 LAB — URINALYSIS, ROUTINE W REFLEX MICROSCOPIC
Bilirubin Urine: NEGATIVE
Glucose, UA: NEGATIVE mg/dL
Hgb urine dipstick: NEGATIVE
Ketones, ur: NEGATIVE mg/dL
Leukocytes, UA: NEGATIVE
Nitrite: NEGATIVE
Protein, ur: NEGATIVE mg/dL
Specific Gravity, Urine: 1.009 (ref 1.005–1.030)
pH: 6 (ref 5.0–8.0)

## 2017-09-07 NOTE — MAU Note (Signed)
I have communicated with Lanice ShirtsV Rogers CNM and reviewed vital signs:  Vitals:   09/07/17 0054  BP: 126/66  Pulse: (!) 103  Resp: 18  Temp: 97.9 F (36.6 C)  SpO2: 99%    Vaginal exam:  Dilation: Closed Exam by:: D Callaway RN,   Also reviewed contraction pattern and that non-stress test is reactive.  It has been documented that patient is contracting every 2- 6 minutes with a closed cervixnot indicating active labor.  Patient denies any other complaints.  Based on this report provider has given order for discharge.  A discharge order and diagnosis entered by a provider.   Labor discharge instructions reviewed with patient.

## 2017-09-07 NOTE — MAU Note (Signed)
Pt reports lower abdominal pain and back pain since 2pm yesterday. Pt was seen yesterday and was not having that pain. Denies LOF or Bleeing. + FM

## 2017-09-07 NOTE — Discharge Instructions (Signed)
Braxton Hicks Contractions °Contractions of the uterus can occur throughout pregnancy, but they are not always a sign that you are in labor. You may have practice contractions called Braxton Hicks contractions. These false labor contractions are sometimes confused with true labor. °What are Braxton Hicks contractions? °Braxton Hicks contractions are tightening movements that occur in the muscles of the uterus before labor. Unlike true labor contractions, these contractions do not result in opening (dilation) and thinning of the cervix. Toward the end of pregnancy (32-34 weeks), Braxton Hicks contractions can happen more often and may become stronger. These contractions are sometimes difficult to tell apart from true labor because they can be very uncomfortable. You should not feel embarrassed if you go to the hospital with false labor. °Sometimes, the only way to tell if you are in true labor is for your health care provider to look for changes in the cervix. The health care provider will do a physical exam and may monitor your contractions. If you are not in true labor, the exam should show that your cervix is not dilating and your water has not broken. °If there are other health problems associated with your pregnancy, it is completely safe for you to be sent home with false labor. You may continue to have Braxton Hicks contractions until you go into true labor. °How to tell the difference between true labor and false labor °True labor °· Contractions last 30-70 seconds. °· Contractions become very regular. °· Discomfort is usually felt in the top of the uterus, and it spreads to the lower abdomen and low back. °· Contractions do not go away with walking. °· Contractions usually become more intense and increase in frequency. °· The cervix dilates and gets thinner. °False labor °· Contractions are usually shorter and not as strong as true labor contractions. °· Contractions are usually irregular. °· Contractions  are often felt in the front of the lower abdomen and in the groin. °· Contractions may go away when you walk around or change positions while lying down. °· Contractions get weaker and are shorter-lasting as time goes on. °· The cervix usually does not dilate or become thin. °Follow these instructions at home: °· Take over-the-counter and prescription medicines only as told by your health care provider. °· Keep up with your usual exercises and follow other instructions from your health care provider. °· Eat and drink lightly if you think you are going into labor. °· If Braxton Hicks contractions are making you uncomfortable: °? Change your position from lying down or resting to walking, or change from walking to resting. °? Sit and rest in a tub of warm water. °? Drink enough fluid to keep your urine pale yellow. Dehydration may cause these contractions. °? Do slow and deep breathing several times an hour. °· Keep all follow-up prenatal visits as told by your health care provider. This is important. °Contact a health care provider if: °· You have a fever. °· You have continuous pain in your abdomen. °Get help right away if: °· Your contractions become stronger, more regular, and closer together. °· You have fluid leaking or gushing from your vagina. °· You pass blood-tinged mucus (bloody show). °· You have bleeding from your vagina. °· You have low back pain that you never had before. °· You feel your baby’s head pushing down and causing pelvic pressure. °· Your baby is not moving inside you as much as it used to. °Summary °· Contractions that occur before labor are called Braxton   Hicks contractions, false labor, or practice contractions. °· Braxton Hicks contractions are usually shorter, weaker, farther apart, and less regular than true labor contractions. True labor contractions usually become progressively stronger and regular and they become more frequent. °· Manage discomfort from Braxton Hicks contractions by  changing position, resting in a warm bath, drinking plenty of water, or practicing deep breathing. °This information is not intended to replace advice given to you by your health care provider. Make sure you discuss any questions you have with your health care provider. °Document Released: 08/10/2016 Document Revised: 08/10/2016 Document Reviewed: 08/10/2016 °Elsevier Interactive Patient Education © 2018 [employer] ° °

## 2017-09-07 NOTE — MAU Note (Signed)
Pt reports a lot of pelvic pain when she walks. Pt denies contractions. Denies LOF or vaginal bleeding. Reports good fetal movement. Cervix has not been examined

## 2017-09-10 LAB — STREP GP B CULTURE+RFLX: Strep Gp B Culture+Rflx: NEGATIVE

## 2017-09-13 ENCOUNTER — Ambulatory Visit (INDEPENDENT_AMBULATORY_CARE_PROVIDER_SITE_OTHER): Payer: Medicaid Other | Admitting: Obstetrics

## 2017-09-13 ENCOUNTER — Encounter: Payer: Self-pay | Admitting: Obstetrics

## 2017-09-13 DIAGNOSIS — Z3400 Encounter for supervision of normal first pregnancy, unspecified trimester: Secondary | ICD-10-CM

## 2017-09-13 DIAGNOSIS — Z3403 Encounter for supervision of normal first pregnancy, third trimester: Secondary | ICD-10-CM

## 2017-09-13 NOTE — Progress Notes (Signed)
ROB w/ no complaints  GBS NEGATIVE

## 2017-09-13 NOTE — Progress Notes (Signed)
Subjective:  Helen White is a 22 y.o. G1P0 at 6441w3d being seen today for ongoing prenatal care.  She is currently monitored for the following issues for this low-risk pregnancy and has Supervision of normal first pregnancy, antepartum on their problem list.  Patient reports occasional contractions.  Contractions: Not present. Vag. Bleeding: None.  Movement: Present. Denies leaking of fluid.   The following portions of the patient's history were reviewed and updated as appropriate: allergies, current medications, past family history, past medical history, past social history, past surgical history and problem list. Problem list updated.  Objective:   Vitals:   09/13/17 0856  BP: 100/66  Pulse: (!) 102  Weight: 187 lb 4.8 oz (85 kg)    Fetal Status:     Movement: Present     General:  Alert, oriented and cooperative. Patient is in no acute distress.  Skin: Skin is warm and dry. No rash noted.   Cardiovascular: Normal heart rate noted  Respiratory: Normal respiratory effort, no problems with respiration noted  Abdomen: Soft, gravid, appropriate for gestational age. Pain/Pressure: Absent     Pelvic:  Cervical exam deferred        Extremities: Normal range of motion.  Edema: Trace  Mental Status: Normal mood and affect. Normal behavior. Normal judgment and thought content.   Urinalysis:      Assessment and Plan:  Pregnancy: G1P0 at 4941w3d  1. Supervision of normal first pregnancy, antepartum   Term labor symptoms and general obstetric precautions including but not limited to vaginal bleeding, contractions, leaking of fluid and fetal movement were reviewed in detail with the patient. Please refer to After Visit Summary for other counseling recommendations.  Return in about 1 week (around 09/20/2017) for ROB.   Brock BadHarper, Froilan Mclean A, MD

## 2017-09-16 ENCOUNTER — Inpatient Hospital Stay (HOSPITAL_COMMUNITY)
Admission: AD | Admit: 2017-09-16 | Discharge: 2017-09-16 | Disposition: A | Discharge status: Home / Self Care | Payer: Medicaid Other | Source: Ambulatory Visit | Attending: Obstetrics & Gynecology | Admitting: Obstetrics & Gynecology

## 2017-09-16 ENCOUNTER — Encounter (HOSPITAL_COMMUNITY): Payer: Self-pay

## 2017-09-16 DIAGNOSIS — Z3A38 38 weeks gestation of pregnancy: Secondary | ICD-10-CM | POA: Insufficient documentation

## 2017-09-16 DIAGNOSIS — O479 False labor, unspecified: Secondary | ICD-10-CM

## 2017-09-16 DIAGNOSIS — O471 False labor at or after 37 completed weeks of gestation: Secondary | ICD-10-CM | POA: Insufficient documentation

## 2017-09-16 NOTE — MAU Note (Signed)
CTX for the past 4 hours-now 2-3 minutes apart.  No LOF/VB.  +FM.  No complications with the pregnancy.  GBS negative.  Closed on last VE.

## 2017-09-16 NOTE — MAU Note (Signed)
I have communicated with Raelyn Moraolitta Dawson, CNM and reviewed vital signs:  Vitals:   09/16/17 2023  BP: 110/62  Pulse: (!) 108  Resp: 19  SpO2: 96%    Vaginal exam:  Dilation: Closed Effacement (%): Thick Cervical Position: Posterior Exam by:: Latricia HeftAnna Albirda Shiel, RN,   Also reviewed contraction pattern and that non-stress test is reactive.  It has been documented that patient is not contracting, not indicating active labor.  Patient denies any other complaints.  Based on this report provider has given order for discharge.  A discharge order and diagnosis entered by a provider.   Labor discharge instructions reviewed with patient.

## 2017-09-20 ENCOUNTER — Ambulatory Visit (INDEPENDENT_AMBULATORY_CARE_PROVIDER_SITE_OTHER): Payer: Medicaid Other | Admitting: Obstetrics

## 2017-09-20 ENCOUNTER — Encounter: Payer: Self-pay | Admitting: Obstetrics

## 2017-09-20 VITALS — BP 112/72 | HR 112 | Wt 190.1 lb

## 2017-09-20 DIAGNOSIS — Z3400 Encounter for supervision of normal first pregnancy, unspecified trimester: Secondary | ICD-10-CM

## 2017-09-20 DIAGNOSIS — Z3403 Encounter for supervision of normal first pregnancy, third trimester: Secondary | ICD-10-CM

## 2017-09-20 NOTE — Progress Notes (Signed)
Subjective:  Helen White is a 22 y.o. G1P0 at 5951w3d being seen today for ongoing prenatal care.  She is currently monitored for the following issues for this low-risk pregnancy and has Supervision of normal first pregnancy, antepartum on their problem list.  Patient reports occasional contractions.  Contractions: Irregular. Vag. Bleeding: None.  Movement: Present. Denies leaking of fluid.   The following portions of the patient's history were reviewed and updated as appropriate: allergies, current medications, past family history, past medical history, past social history, past surgical history and problem list. Problem list updated.  Objective:   Vitals:   09/20/17 0959  BP: 112/72  Pulse: (!) 112  Weight: 190 lb 1.6 oz (86.2 kg)    Fetal Status: Fetal Heart Rate (bpm): 140   Movement: Present     General:  Alert, oriented and cooperative. Patient is in no acute distress.  Skin: Skin is warm and dry. No rash noted.   Cardiovascular: Normal heart rate noted  Respiratory: Normal respiratory effort, no problems with respiration noted  Abdomen: Soft, gravid, appropriate for gestational age. Pain/Pressure: Present     Pelvic:  Cervical exam deferred        Extremities: Normal range of motion.  Edema: Trace  Mental Status: Normal mood and affect. Normal behavior. Normal judgment and thought content.   Urinalysis:      Assessment and Plan:  Pregnancy: G1P0 at 6251w3d  1. Supervision of normal first pregnancy, antepartum   Term labor symptoms and general obstetric precautions including but not limited to vaginal bleeding, contractions, leaking of fluid and fetal movement were reviewed in detail with the patient. Please refer to After Visit Summary for other counseling recommendations.  Return in about 1 week (around 09/27/2017) for ROB.   Brock BadHarper, Charles A, MD

## 2017-09-20 NOTE — Progress Notes (Signed)
GBS NEGATIVE on 09/06/17 MAU visit on 09/16/17 for contractions. Pt want to discuss induction.

## 2017-09-27 ENCOUNTER — Encounter: Payer: Self-pay | Admitting: Obstetrics & Gynecology

## 2017-09-27 ENCOUNTER — Ambulatory Visit (INDEPENDENT_AMBULATORY_CARE_PROVIDER_SITE_OTHER): Payer: Medicaid Other | Admitting: Obstetrics & Gynecology

## 2017-09-27 ENCOUNTER — Telehealth (HOSPITAL_COMMUNITY): Payer: Self-pay | Admitting: *Deleted

## 2017-09-27 DIAGNOSIS — Z3400 Encounter for supervision of normal first pregnancy, unspecified trimester: Secondary | ICD-10-CM

## 2017-09-27 DIAGNOSIS — Z3403 Encounter for supervision of normal first pregnancy, third trimester: Secondary | ICD-10-CM

## 2017-09-27 NOTE — Progress Notes (Signed)
   PRENATAL VISIT NOTE  Subjective:  Helen White is a 22 y.o. G1P0 at 7263w3d being seen today for ongoing prenatal care.  She is currently monitored for the following issues for this low-risk pregnancy and has Supervision of normal first pregnancy, antepartum on their problem list.  Patient reports occasional contractions.  Contractions: Irritability. Vag. Bleeding: None.  Movement: Present. Denies leaking of fluid.   The following portions of the patient's history were reviewed and updated as appropriate: allergies, current medications, past family history, past medical history, past social history, past surgical history and problem list. Problem list updated.  Objective:   Vitals:   09/27/17 1107  BP: 113/74  Pulse: (!) 111  Weight: 193 lb 8 oz (87.8 kg)    Fetal Status: Fetal Heart Rate (bpm): 145   Movement: Present     General:  Alert, oriented and cooperative. Patient is in no acute distress.  Skin: Skin is warm and dry. No rash noted.   Cardiovascular: Normal heart rate noted  Respiratory: Normal respiratory effort, no problems with respiration noted  Abdomen: Soft, gravid, appropriate for gestational age.  Pain/Pressure: Present     Pelvic: Cervix examined  Extremities: Normal range of motion.  Edema: Trace  Mental Status: Normal mood and affect. Normal behavior. Normal judgment and thought content.   Assessment and Plan:  Pregnancy: G1P0 at 2063w3d  1. Supervision of normal first pregnancy, antepartum IOL 41 weeks  Term labor symptoms and general obstetric precautions including but not limited to vaginal bleeding, contractions, leaking of fluid and fetal movement were reviewed in detail with the patient. Please refer to After Visit Summary for other counseling recommendations.  Return in about 1 week (around 10/04/2017).  No future appointments.  Scheryl DarterJames Johnattan Strassman, MD

## 2017-09-27 NOTE — Telephone Encounter (Signed)
Preadmission screen  

## 2017-09-27 NOTE — Patient Instructions (Signed)
Labor Induction Labor induction is when steps are taken to cause a pregnant woman to begin the labor process. Most women go into labor on their own between 37 weeks and 42 weeks of the pregnancy. When this does not happen or when there is a medical need, methods may be used to induce labor. Labor induction causes a pregnant woman's uterus to contract. It also causes the cervix to soften (ripen), open (dilate), and thin out (efface). Usually, labor is not induced before 39 weeks of the pregnancy unless there is a problem with the baby or mother. Before inducing labor, your health care provider will consider a number of factors, including the following:  The medical condition of you and the baby.  How many weeks along you are.  The status of the baby's lung maturity.  The condition of the cervix.  The position of the baby. What are the reasons for labor induction? Labor may be induced for the following reasons:  The health of the baby or mother is at risk.  The pregnancy is overdue by 1 week or more.  The water breaks but labor does not start on its own.  The mother has a health condition or serious illness, such as high blood pressure, infection, placental abruption, or diabetes.  The amniotic fluid amounts are low around the baby.  The baby is distressed. Convenience or wanting the baby to be born on a certain date is not a reason for inducing labor. What methods are used for labor induction? Several methods of labor induction may be used, such as:  Prostaglandin medicine. This medicine causes the cervix to dilate and ripen. The medicine will also start contractions. It can be taken by mouth or by inserting a suppository into the vagina.  Inserting a thin tube (catheter) with a balloon on the end into the vagina to dilate the cervix. Once inserted, the balloon is expanded with water, which causes the cervix to open.  Stripping the membranes. Your health care provider separates  amniotic sac tissue from the cervix, causing the cervix to be stretched and causing the release of a hormone called progesterone. This may cause the uterus to contract. It is often done during an office visit. You will be sent home to wait for the contractions to begin. You will then come in for an induction.  Breaking the water. Your health care provider makes a hole in the amniotic sac using a small instrument. Once the amniotic sac breaks, contractions should begin. This may still take hours to see an effect.  Medicine to trigger or strengthen contractions. This medicine is given through an IV access tube inserted into a vein in your arm. All of the methods of induction, besides stripping the membranes, will be done in the hospital. Induction is done in the hospital so that you and the baby can be carefully monitored. How long does it take for labor to be induced? Some inductions can take up to 2-3 days. Depending on the cervix, it usually takes less time. It takes longer when you are induced early in the pregnancy or if this is your first pregnancy. If a mother is still pregnant and the induction has been going on for 2-3 days, either the mother will be sent home or a cesarean delivery will be needed. What are the risks associated with labor induction? Some of the risks of induction include:  Changes in fetal heart rate, such as too high, too low, or erratic.  Fetal distress.    Chance of infection for the mother and baby.  Increased chance of having a cesarean delivery.  Breaking off (abruption) of the placenta from the uterus (rare).  Uterine rupture (very rare). When induction is needed for medical reasons, the benefits of induction may outweigh the risks. What are some reasons for not inducing labor? Labor induction should not be done if:  It is shown that your baby does not tolerate labor.  You have had previous surgeries on your uterus, such as a myomectomy or the removal of  fibroids.  Your placenta lies very low in the uterus and blocks the opening of the cervix (placenta previa).  Your baby is not in a head-down position.  The umbilical cord drops down into the birth canal in front of the baby. This could cut off the baby's blood and oxygen supply.  You have had a previous cesarean delivery.  There are unusual circumstances, such as the baby being extremely premature. This information is not intended to replace advice given to you by your health care provider. Make sure you discuss any questions you have with your health care provider. Document Released: 08/16/2006 Document Revised: 09/02/2015 Document Reviewed: 10/24/2012 Elsevier Interactive Patient Education  2017 Elsevier Inc.  

## 2017-10-04 ENCOUNTER — Ambulatory Visit (INDEPENDENT_AMBULATORY_CARE_PROVIDER_SITE_OTHER): Payer: Medicaid Other | Admitting: Certified Nurse Midwife

## 2017-10-04 VITALS — BP 123/78 | HR 121 | Wt 193.8 lb

## 2017-10-04 DIAGNOSIS — Z3403 Encounter for supervision of normal first pregnancy, third trimester: Secondary | ICD-10-CM

## 2017-10-04 DIAGNOSIS — Z3400 Encounter for supervision of normal first pregnancy, unspecified trimester: Secondary | ICD-10-CM

## 2017-10-04 NOTE — Progress Notes (Signed)
   PRENATAL VISIT NOTE  Subjective:  Helen White is a 22 y.o. G1P0 at 6869w3d being seen today for ongoing prenatal care.  She is currently monitored for the following issues for this low-risk pregnancy and has Supervision of normal first pregnancy, antepartum on their problem list.  Patient reports no complaints.  Contractions: Irritability. Vag. Bleeding: None.  Movement: Present. Denies leaking of fluid.   The following portions of the patient's history were reviewed and updated as appropriate: allergies, current medications, past family history, past medical history, past social history, past surgical history and problem list. Problem list updated.  Objective:   Vitals:   10/04/17 1115  BP: 123/78  Pulse: (!) 121  Weight: 193 lb 12.8 oz (87.9 kg)    Fetal Status: Fetal Heart Rate (bpm): NST; reactive Fundal Height: 39 cm Movement: Present  Presentation: Vertex  General:  Alert, oriented and cooperative. Patient is in no acute distress.  Skin: Skin is warm and dry. No rash noted.   Cardiovascular: Normal heart rate noted  Respiratory: Normal respiratory effort, no problems with respiration noted  Abdomen: Soft, gravid, appropriate for gestational age.  Pain/Pressure: Absent     Pelvic: Cervical exam performed Dilation: 1 Effacement (%): 40 Station: -3  Extremities: Normal range of motion.  Edema: Trace  Mental Status: Normal mood and affect. Normal behavior. Normal judgment and thought content.  NST: + accels, no decels, moderate variability, Cat. 1 tracing. No contractions on toco.   Assessment and Plan:  Pregnancy: G1P0 at 5169w3d  1. Supervision of normal first pregnancy, antepartum    Doing well.  Foley bulb IOL discussed.  Is scheduled for IOL on Monday 10/08/17.    Term labor symptoms and general obstetric precautions including but not limited to vaginal bleeding, contractions, leaking of fluid and fetal movement were reviewed in detail with the patient. Please refer  to After Visit Summary for other counseling recommendations.   Return in about 1 month (around 11/01/2017) for Postpartum.  Future Appointments  Date Time Provider Department Center  10/08/2017  7:30 AM [facility] None    Roe Coombsachelle A Denney, CNM

## 2017-10-07 ENCOUNTER — Inpatient Hospital Stay (HOSPITAL_COMMUNITY): Payer: Medicaid Other | Admitting: Anesthesiology

## 2017-10-07 ENCOUNTER — Inpatient Hospital Stay (HOSPITAL_COMMUNITY)
Admission: AD | Admit: 2017-10-07 | Discharge: 2017-10-10 | DRG: 807 | Disposition: A | Discharge status: Home / Self Care | Payer: Medicaid Other | Attending: Obstetrics & Gynecology | Admitting: Obstetrics & Gynecology

## 2017-10-07 ENCOUNTER — Encounter (HOSPITAL_COMMUNITY): Payer: Self-pay | Admitting: *Deleted

## 2017-10-07 ENCOUNTER — Other Ambulatory Visit: Payer: Self-pay

## 2017-10-07 DIAGNOSIS — Z880 Allergy status to penicillin: Secondary | ICD-10-CM | POA: Diagnosis not present

## 2017-10-07 DIAGNOSIS — Z3A40 40 weeks gestation of pregnancy: Secondary | ICD-10-CM

## 2017-10-07 DIAGNOSIS — O480 Post-term pregnancy: Principal | ICD-10-CM | POA: Diagnosis present

## 2017-10-07 DIAGNOSIS — O4292 Full-term premature rupture of membranes, unspecified as to length of time between rupture and onset of labor: Secondary | ICD-10-CM | POA: Diagnosis present

## 2017-10-07 DIAGNOSIS — Z0371 Encounter for suspected problem with amniotic cavity and membrane ruled out: Secondary | ICD-10-CM

## 2017-10-07 LAB — CBC
HCT: 36.8 % (ref 36.0–46.0)
HEMOGLOBIN: 12.5 g/dL (ref 12.0–15.0)
MCH: 30.3 pg (ref 26.0–34.0)
MCHC: 34 g/dL (ref 30.0–36.0)
MCV: 89.3 fL (ref 78.0–100.0)
Platelets: 243 10*3/uL (ref 150–400)
RBC: 4.12 MIL/uL (ref 3.87–5.11)
RDW: 14.3 % (ref 11.5–15.5)
WBC: 10.8 10*3/uL — ABNORMAL HIGH (ref 4.0–10.5)

## 2017-10-07 LAB — TYPE AND SCREEN
ABO/RH(D): O POS
Antibody Screen: NEGATIVE

## 2017-10-07 LAB — ABO/RH: ABO/RH(D): O POS

## 2017-10-07 LAB — POCT FERN TEST: POCT FERN TEST: NEGATIVE

## 2017-10-07 MED ORDER — SOD CITRATE-CITRIC ACID 500-334 MG/5ML PO SOLN: 30.0000 mL | ORAL | Status: DC | PRN

## 2017-10-07 MED ORDER — LIDOCAINE HCL (PF) 1 % IJ SOLN
30.0000 mL | INTRAMUSCULAR | Status: AC | PRN
  Administered 2017-10-08: 30 mL via SUBCUTANEOUS
  Filled 2017-10-07: qty 30

## 2017-10-07 MED ORDER — LACTATED RINGERS IV SOLN
INTRAVENOUS | Status: DC
  Administered 2017-10-07 – 2017-10-08 (×5): via INTRAVENOUS

## 2017-10-07 MED ORDER — PHENYLEPHRINE 40 MCG/ML (10ML) SYRINGE FOR IV PUSH (FOR BLOOD PRESSURE SUPPORT): 80.0000 ug | PREFILLED_SYRINGE | INTRAVENOUS | Status: DC | PRN

## 2017-10-07 MED ORDER — LACTATED RINGERS IV SOLN: 500.0000 mL | Freq: Once | INTRAVENOUS | Status: DC

## 2017-10-07 MED ORDER — EPHEDRINE 5 MG/ML INJ
10.0000 mg | INTRAVENOUS | Status: DC | PRN
  Filled 2017-10-07: qty 2

## 2017-10-07 MED ORDER — FENTANYL CITRATE (PF) 100 MCG/2ML IJ SOLN
100.0000 ug | INTRAMUSCULAR | Status: DC | PRN
  Administered 2017-10-07 (×5): 100 ug via INTRAVENOUS
  Filled 2017-10-07 (×5): qty 2

## 2017-10-07 MED ORDER — OXYTOCIN 40 UNITS IN LACTATED RINGERS INFUSION - SIMPLE MED
1.0000 m[IU]/min | INTRAVENOUS | Status: DC
  Administered 2017-10-07: 2 m[IU]/min via INTRAVENOUS
  Filled 2017-10-07: qty 1000

## 2017-10-07 MED ORDER — OXYTOCIN 40 UNITS IN LACTATED RINGERS INFUSION - SIMPLE MED: 2.5000 [IU]/h | INTRAVENOUS | Status: DC

## 2017-10-07 MED ORDER — DIPHENHYDRAMINE HCL 50 MG/ML IJ SOLN
12.5000 mg | INTRAMUSCULAR | Status: AC | PRN
  Administered 2017-10-07 – 2017-10-08 (×3): 12.5 mg via INTRAVENOUS
  Filled 2017-10-07: qty 1

## 2017-10-07 MED ORDER — ONDANSETRON HCL 4 MG/2ML IJ SOLN: 4.0000 mg | Freq: Four times a day (QID) | INTRAMUSCULAR | Status: DC | PRN

## 2017-10-07 MED ORDER — OXYTOCIN BOLUS FROM INFUSION
500.0000 mL | Freq: Once | INTRAVENOUS | Status: AC
  Administered 2017-10-08: 500 mL via INTRAVENOUS

## 2017-10-07 MED ORDER — LACTATED RINGERS IV SOLN
500.0000 mL | INTRAVENOUS | Status: DC | PRN
  Administered 2017-10-07 – 2017-10-08 (×2): 500 mL via INTRAVENOUS

## 2017-10-07 MED ORDER — EPHEDRINE 5 MG/ML INJ: 10.0000 mg | INTRAVENOUS | Status: DC | PRN

## 2017-10-07 MED ORDER — PHENYLEPHRINE 40 MCG/ML (10ML) SYRINGE FOR IV PUSH (FOR BLOOD PRESSURE SUPPORT)
80.0000 ug | PREFILLED_SYRINGE | INTRAVENOUS | Status: DC | PRN
  Filled 2017-10-07: qty 5

## 2017-10-07 MED ORDER — FENTANYL 2.5 MCG/ML BUPIVACAINE 1/10 % EPIDURAL INFUSION (WH - ANES)
14.0000 mL/h | INTRAMUSCULAR | Status: DC | PRN
  Administered 2017-10-07 – 2017-10-08 (×2): 14 mL/h via EPIDURAL
  Filled 2017-10-07 (×2): qty 100

## 2017-10-07 MED ORDER — ACETAMINOPHEN 325 MG PO TABS: 650.0000 mg | ORAL_TABLET | ORAL | Status: DC | PRN

## 2017-10-07 MED ORDER — TERBUTALINE SULFATE 1 MG/ML IJ SOLN
0.2500 mg | Freq: Once | INTRAMUSCULAR | Status: DC | PRN
  Filled 2017-10-07: qty 1

## 2017-10-07 MED ORDER — LIDOCAINE HCL (PF) 1 % IJ SOLN
INTRAMUSCULAR | Status: DC | PRN
  Administered 2017-10-07: 5 mL via EPIDURAL

## 2017-10-07 MED ORDER — OXYCODONE-ACETAMINOPHEN 5-325 MG PO TABS: 1.0000 | ORAL_TABLET | ORAL | Status: DC | PRN

## 2017-10-07 MED ORDER — OXYCODONE-ACETAMINOPHEN 5-325 MG PO TABS: 2.0000 | ORAL_TABLET | ORAL | Status: DC | PRN

## 2017-10-07 MED ORDER — PHENYLEPHRINE 40 MCG/ML (10ML) SYRINGE FOR IV PUSH (FOR BLOOD PRESSURE SUPPORT)
80.0000 ug | PREFILLED_SYRINGE | INTRAVENOUS | Status: DC | PRN
  Filled 2017-10-07: qty 5
  Filled 2017-10-07: qty 10

## 2017-10-07 MED ORDER — MISOPROSTOL 50MCG HALF TABLET
50.0000 ug | ORAL_TABLET | ORAL | Status: DC | PRN
  Filled 2017-10-07: qty 1

## 2017-10-07 NOTE — Progress Notes (Signed)
Verda Larita FifeLynn Ikner is a 22 y.o. G1P0 at 4033w6d by ultrasound admitted for rupture of membranes  Subjective:   Objective: BP (!) 107/54   Pulse (!) 113   Temp 98.7 F (37.1 C)   Resp 16   Ht 5\' 4"  (1.626 m)   Wt 195 lb (88.5 kg)   LMP 12/25/2016 (Exact Date)   SpO2 98%   BMI 33.47 kg/m  No intake/output data recorded. No intake/output data recorded.  FHT:  FHR: 130's bpm, variability: moderate,  accelerations:  Present,  decelerations:  Absent UC:   regular, every 2 minutes SVE:   Dilation: 6 Effacement (%): 70 Station: -2 Exam by:: J.Follmer,RNC  Labs: Lab Results  Component Value Date   WBC 10.8 (H) 10/07/2017   HGB 12.5 10/07/2017   HCT 36.8 10/07/2017   MCV 89.3 10/07/2017   PLT 243 10/07/2017    Assessment / Plan: Induction of labor due to PROM,  progressing well on pitocin  Labor: Progressing normally Preeclampsia:  no signs or symptoms of toxicity Fetal Wellbeing:  Category I Pain Control:  Epidural I/D:  n/a Anticipated MOD:  NSVD  Wyvonnia DuskyMarie Lawson 10/07/2017, 6:17 PM

## 2017-10-07 NOTE — Progress Notes (Signed)
   Helen White is a 22 y.o. G1P0 at 2066w6d  admitted for SROM after placement of FB in MAU. FB came out at 1:30  Subjective: Doing well; states that her BP is normal at 81/65.   Objective: Vitals:   10/07/17 2110 10/07/17 2115 10/07/17 2120 10/07/17 2130  BP:    113/72  Pulse:    (!) 138  Resp:    18  Temp: 98.6 F (37 C)     TempSrc: Oral     SpO2: 96% 98% 97%   Weight:      Height:       Total I/O In: -  Out: 750 [Urine:750]  FHT:  FHR: 135 bpm, variability: moderate,  accelerations:  Present,  decelerations:  Present between 2230-2300 patient had 3 late decels out of 7 contractions. Between 2200 and 2230 patient had 9 decelerations with 13 contractions.  UC:   irregular, every 2-3 minutes SVE:   Dilation: 7 Effacement (%): 70 Station: -1 Exam by:: Alanah Sakuma, CNM   Labs: Lab Results  Component Value Date   WBC 10.8 (H) 10/07/2017   HGB 12.5 10/07/2017   HCT 36.8 10/07/2017   MCV 89.3 10/07/2017   PLT 243 10/07/2017   MVU at 2200-2230 MVU were between 175 and 210.   Assessment / Plan:  Pitocin dc at 2230; FSE placed and patient repositioned. Bolus was given at 2202. Will consider Amnioinfusion as next step. Dr. Alysia PennaErvin reviewed strip and is aware of plan of care.    Labor: Patient in active labor but now with concerning fetal heart rate. See interventions noted above.  Fetal Wellbeing:  Category II Pain Control:  Epidural Anticipated MOD:  NSVD  Helen White 10/07/2017, 10:43 PM

## 2017-10-07 NOTE — Anesthesia Procedure Notes (Signed)
Epidural Patient location during procedure: OB Start time: 10/07/2017 6:51 PM End time: 10/07/2017 7:09 PM  Staffing Anesthesiologist: Trevor IhaHouser, Jakira Mcfadden A, MD Performed: anesthesiologist   Preanesthetic Checklist Completed: patient identified, site marked, surgical consent, pre-op evaluation, timeout performed, IV checked, risks and benefits discussed and monitors and equipment checked  Epidural Patient position: sitting Prep: site prepped and draped and DuraPrep Patient monitoring: continuous pulse ox and blood pressure Approach: midline Location: L3-L4 Injection technique: LOR air  Needle:  Needle type: Tuohy  Needle gauge: 17 G Needle length: 9 cm and 9 Needle insertion depth: 7 cm Catheter type: closed end flexible Catheter size: 19 Gauge Catheter at skin depth: 12 cm Test dose: negative  Assessment Events: blood not aspirated, injection not painful, no injection resistance, negative IV test and no paresthesia

## 2017-10-07 NOTE — H&P (Signed)
HPI: Helen White is a 22 y.o. year old G1P0 female at [redacted]w[redacted]d weeks gestation who presents to MAU reporting leaking of fluid since ~2000. Fern and pool were negative. Pt was scheduled for IOL for Post-dates tomorrow so OP foley was placed at 0800 and pt started leaking after placement. Fern positive.   Clinic  [facility] Prenatal Labs  Dating LMP/21  Blood type: O/Positive/-- (01/23 0000)O+   Genetic Screen  Quad: Neg Antibody:Negative (01/23 0000)Neg  Anatomic US 05-29-17 Rubella: Immune (01/30 0000)  GTT Early:               Third trimester: Nml RPR: Non Reactive (04/01 1020)   Flu vaccine Re[facility]/02/17 [facility]  HBsAg: Negative (01/30 0000)   TDaP vaccine  07/09/17                            Rhogam: NA HIV: Non Reactive (04/01 1020)  Baby Food     Breast                                           GBS:  Neg   Contraception   Depo?? Or BCP'S Pap: 05/02/17 NEGATIVEO  Circumcision N/a female   Pediatrician  Dr. Donnie Coffinubin CF: Neg  Support Person Husband  Helen White  SMA  Prenatal Classes Scheduled [facility]/01/2017 [facility]  Hgb electrophoresis:    OB History    Gravida  1   Para      Term      Preterm      AB      Living        SAB      TAB      Ectopic      Multiple      Live Births             Past Medical History:  Diagnosis Date  . Medical history non-contributory    Past Surgical History:  Procedure Laterality Date  . NO PAST SURGERIES     Family History: family history is not on file. Social History:  reports that she has never smoked. She has never used smokeless tobacco. She reports that she does not drink alcohol or use drugs.     Maternal Diabetes: No Genetic Screening: Normal Maternal Ultrasounds/Referrals: Normal Fetal Ultrasounds or other Referrals:  None Maternal Substance Abuse:  No Significant Maternal Medications:  None Significant Maternal Lab Results:  Lab values include: Group B Strep negative Other Comments:  None  Review of  Systems  Constitutional: Negative for chills and fever.  Eyes: Negative for blurred vision.  Gastrointestinal: Positive for abdominal pain (Contractions only). Negative for nausea and vomiting.  Genitourinary:       Small amount of blood fluid after foley bulb placement  Neurological: Negative for headaches.   Maternal Medical History:  Reason for admission: Nausea.    Dilation: 1 Effacement (%): 50 Station: -3 Exam by:: [place] Asyah Candler, CNM Blood pressure 124/67, pulse (!) 104, temperature 98.2 F (36.8 C), temperature source Oral, resp. rate 16, last menstrual period 12/25/2016, SpO2 99 %. Maternal Exam:  Uterine Assessment: Contraction strength is mild.  Contraction frequency is regular.   Abdomen: Patient reports no abdominal tenderness. Estimated fetal weight is 7-12.   Fetal presentation: vertex  Introitus: Normal vulva. Vagina is positive for vaginal discharge (  yeast).  Ferning test: positive.   Pelvis: adequate for delivery.   Cervix: Cervix evaluated by sterile speculum exam and digital exam.     Fetal Exam Fetal Monitor Review: Mode: ultrasound.   Baseline rate: 125.  Variability: moderate (6-25 bpm).   Pattern: accelerations present and no decelerations.    Fetal State Assessment: Category I - tracings are normal.     Physical Exam  Nursing note and vitals reviewed. Constitutional: She is oriented to person, place, and time. She appears well-developed and well-nourished. She appears distressed (mildly).  HENT:  Head: Normocephalic and atraumatic.  Eyes: No scleral icterus.  Cardiovascular: Normal rate.  Respiratory: Effort normal. No respiratory distress.  GI: Soft. There is no tenderness.  Genitourinary: Vaginal discharge (yeast) found.  Musculoskeletal: She exhibits no edema.  Neurological: She is alert and oriented to person, place, and time.  Skin: Skin is warm and dry.  Psychiatric: She has a normal mood and affect.    Prenatal labs: ABO, Rh:  O/Positive/-- (01/23 0000) Antibody: Negative (01/23 0000) Rubella: Immune (01/30 0000) RPR: Non Reactive (04/01 1020)  HBsAg: Negative (01/30 0000)  HIV: Non Reactive (04/01 1020)  GBS:   Neg  Assessment: 1. Labor: Early, ROM 2. Fetal Wellbeing: Category 1  3. Pain Control: None 4. GBS: Neg 5. 40.6 week IUP  Plan:  1. Admit to BS per consult with MD 2. Routine L&D orders 3. Analgesia/anesthesia PRN  4. Foley bulb already in place. May add cytotec PRN for ripening.   Helen White 10/07/2017, 8:28 AM

## 2017-10-07 NOTE — Anesthesia Pain Management Evaluation Note (Signed)
  CR[facility] Pain Management Visit Note  [facility] 22 y.[facility]., female  "Hello I am a member of the anesthesia team at [facility]. We have an anesthesia team available at all times to provide care throughout the hospital, including epidural management and anesthesia for C-section. I don't know your plan for the delivery whether it a natural birth, water birth, IV sedation, nitrous supplementation, doula or epidural, but we want to meet your pain goals."   1.Was your pain managed to your expectations on prior hospitalizations?   No prior hospitalizations  2.What is your expectation for pain management during this hospitalization?     Epidural  3.How can we help you reach that goal? epidural  Record the patient's initial score and the patient's pain goal.   Pain: 3  Pain Goal: 3 The [facility] wants you to be able to say your pain was always managed very well.  [facility] CenterMARSHALL,Harriet Sutphen 10/07/2017

## 2017-10-07 NOTE — Anesthesia Preprocedure Evaluation (Addendum)
Anesthesia Evaluation  Patient identified by MRN, date of birth, ID band Patient awake    Reviewed: Allergy & Precautions, NPO status , Patient's Chart, lab work & pertinent test results  Airway Mallampati: II  TM Distance: >3 FB Neck ROM: Full    Dental no notable dental hx. (+) Teeth Intact   Pulmonary neg pulmonary ROS,    Pulmonary exam normal breath sounds clear to auscultation       Cardiovascular negative cardio ROS Normal cardiovascular exam Rhythm:Regular Rate:Normal     Neuro/Psych negative neurological ROS  negative psych ROS   GI/Hepatic negative GI ROS,   Endo/Other    Renal/GU      Musculoskeletal   Abdominal   Peds  Hematology   Anesthesia Other Findings   Reproductive/Obstetrics (+) Pregnancy                             Lab Results  Component Value Date   WBC 10.8 (H) 10/07/2017   HGB 12.5 10/07/2017   HCT 36.8 10/07/2017   MCV 89.3 10/07/2017   PLT 243 10/07/2017    Anesthesia Physical Anesthesia Plan  ASA: II  Anesthesia Plan: Epidural   Post-op Pain Management:    Induction:   PONV Risk Score and Plan:   Airway Management Planned:   Additional Equipment:   Intra-op Plan:   Post-operative Plan:   Informed Consent: I have reviewed the patients History and Physical, chart, labs and discussed the procedure including the risks, benefits and alternatives for the proposed anesthesia with the patient or authorized representative who has indicated his/her understanding and acceptance.     Plan Discussed with:   Anesthesia Plan Comments:         Anesthesia Quick Evaluation

## 2017-10-07 NOTE — MAU Note (Signed)
Pt reports lof @ 0430. Some mild contractions. + FM

## 2017-10-07 NOTE — Progress Notes (Signed)
Helen White is a 22 y.o. G1P0 at 340w6d by ultrasound admitted for rupture of membranes  Subjective:   Objective: BP (!) 110/56   Pulse (!) 101   Temp 98.3 F (36.8 C) (Oral)   Resp 16   Ht 5\' 4"  (1.626 m)   Wt 195 lb (88.5 kg)   LMP 12/25/2016 (Exact Date)   SpO2 99%   BMI 33.47 kg/m  No intake/output data recorded. No intake/output data recorded.  FHT:  FHR: 130's bpm, variability: moderate,  accelerations:  Present,  decelerations:  Absent UC:   regular, every 2-3 minutes SVE:   Dilation: 1 Effacement (%): 50 Station: -3 Exam by:: Helen White, CNM  Labs: Lab Results  Component Value Date   WBC 10.8 (H) 10/07/2017   HGB 12.5 10/07/2017   HCT 36.8 10/07/2017   MCV 89.3 10/07/2017   PLT 243 10/07/2017    Assessment / Plan: Induction of labor due to PROM,  progressing well on pitocin  Labor: Progressing normally Preeclampsia:  no signs or symptoms of toxicity Fetal Wellbeing:  Category I Pain Control:  Labor support without medications I/D:  n/a Anticipated MOD:  NSVD  Helen White 10/07/2017, 1:12 PM

## 2017-10-08 ENCOUNTER — Inpatient Hospital Stay (HOSPITAL_COMMUNITY): Admission: RE | Admit: 2017-10-08 | Payer: Medicaid Other | Source: Ambulatory Visit

## 2017-10-08 ENCOUNTER — Encounter (HOSPITAL_COMMUNITY): Payer: Self-pay | Admitting: *Deleted

## 2017-10-08 ENCOUNTER — Inpatient Hospital Stay (HOSPITAL_COMMUNITY): Payer: Medicaid Other

## 2017-10-08 DIAGNOSIS — Z3A40 40 weeks gestation of pregnancy: Secondary | ICD-10-CM

## 2017-10-08 LAB — RPR: RPR: NONREACTIVE

## 2017-10-08 MED ORDER — OXYCODONE HCL 5 MG PO TABS: 5.0000 mg | ORAL_TABLET | ORAL | Status: DC | PRN

## 2017-10-08 MED ORDER — IBUPROFEN 600 MG PO TABS
600.0000 mg | ORAL_TABLET | Freq: Four times a day (QID) | ORAL | Status: DC
  Administered 2017-10-08 – 2017-10-10 (×8): 600 mg via ORAL
  Filled 2017-10-08 (×8): qty 1

## 2017-10-08 MED ORDER — PRENATAL MULTIVITAMIN CH
1.0000 | ORAL_TABLET | Freq: Every day | ORAL | Status: DC
  Administered 2017-10-08 – 2017-10-09 (×2): 1 via ORAL
  Filled 2017-10-08 (×2): qty 1

## 2017-10-08 MED ORDER — TETANUS-DIPHTH-ACELL PERTUSSIS 5-2.5-18.5 LF-MCG/0.5 IM SUSP: 0.5000 mL | Freq: Once | INTRAMUSCULAR | Status: DC

## 2017-10-08 MED ORDER — SENNOSIDES-DOCUSATE SODIUM 8.6-50 MG PO TABS
2.0000 | ORAL_TABLET | ORAL | Status: DC
  Administered 2017-10-09 (×2): 2 via ORAL
  Filled 2017-10-08 (×2): qty 2

## 2017-10-08 MED ORDER — SIMETHICONE 80 MG PO CHEW: 80.0000 mg | CHEWABLE_TABLET | ORAL | Status: DC | PRN

## 2017-10-08 MED ORDER — WITCH HAZEL-GLYCERIN EX PADS
1.0000 "application " | MEDICATED_PAD | CUTANEOUS | Status: DC | PRN
  Administered 2017-10-09: 1 via TOPICAL

## 2017-10-08 MED ORDER — DIPHENHYDRAMINE HCL 25 MG PO CAPS: 25.0000 mg | ORAL_CAPSULE | Freq: Four times a day (QID) | ORAL | Status: DC | PRN

## 2017-10-08 MED ORDER — ONDANSETRON HCL 4 MG/2ML IJ SOLN: 4.0000 mg | INTRAMUSCULAR | Status: DC | PRN

## 2017-10-08 MED ORDER — ONDANSETRON HCL 4 MG PO TABS: 4.0000 mg | ORAL_TABLET | ORAL | Status: DC | PRN

## 2017-10-08 MED ORDER — DIBUCAINE 1 % RE OINT: 1.0000 "application " | TOPICAL_OINTMENT | RECTAL | Status: DC | PRN

## 2017-10-08 MED ORDER — ZOLPIDEM TARTRATE 5 MG PO TABS: 5.0000 mg | ORAL_TABLET | Freq: Every evening | ORAL | Status: DC | PRN

## 2017-10-08 MED ORDER — COCONUT OIL OIL: 1.0000 "application " | TOPICAL_OIL | Status: DC | PRN

## 2017-10-08 MED ORDER — BENZOCAINE-MENTHOL 20-0.5 % EX AERO
1.0000 "application " | INHALATION_SPRAY | CUTANEOUS | Status: DC | PRN
  Administered 2017-10-08: 1 via TOPICAL
  Filled 2017-10-08: qty 56

## 2017-10-08 MED ORDER — ACETAMINOPHEN 325 MG PO TABS
650.0000 mg | ORAL_TABLET | ORAL | Status: DC | PRN
  Administered 2017-10-09: 650 mg via ORAL
  Filled 2017-10-08: qty 2

## 2017-10-08 NOTE — Progress Notes (Signed)
   Helen White is a 22 y.o. G1P0 at 2536w0d  admitted for SROM.   Subjective: Patient doing well; feeling the urge to push and doing some practice pushes now with RN At the bedside.   Objective: Vitals:   10/08/17 0530 10/08/17 0545 10/08/17 0600 10/08/17 0630  BP: 118/67 126/74 123/68 130/62  Pulse: (!) 108 (!) 115 (!) 116 (!) 112  Resp: 18 18 18 18   Temp:      TempSrc:      SpO2:      Weight:      Height:       Total I/O In: -  Out: 750 [Urine:750]  FHT:  FHR: 150 bpm, variability: moderate,  accelerations:  Present,  decelerations:  Absent UC:   regular, every 2 minutes SVE:   Dilation: 10 Effacement (%): 70 Station: 0 Exam by:: Helen CornKia Wheatley, RN Pitocin @ 15 mu/min  Labs: Lab Results  Component Value Date   WBC 10.8 (H) 10/07/2017   HGB 12.5 10/07/2017   HCT 36.8 10/07/2017   MCV 89.3 10/07/2017   PLT 243 10/07/2017    Assessment / Plan: now doing some practice pushes since 6:20; RN will assess and let patient rest if no progress.   Labor: Fully dilated; 2nd stage Fetal Wellbeing:  Category I Pain Control:  Epidural Anticipated MOD:  NSVD  Helen White 10/08/2017, 6:39 AM

## 2017-10-08 NOTE — Lactation Note (Signed)
This note was copied from a baby's chart. Lactation Consultation Note  Patient Name: Helen White ZOXWR'UToday's Date: 10/08/2017 Reason for consult: Follow-up assessment  P1 mother whose infant is now 6411 hours old.  I followed up with mother; infant still getting hearing screen completed but will be referred in one ear for tomorrow.  Mother willing to attempt a feeding.  Infant continues to be spitty and congested.  Attempted to latch in the football hold on the right breast.  Infant was able to open mouth and latch but did not suck.  Small emesis noted during attempt.  Reassured ;mother that this is normal and infant is still very young.  Suggested she do STS and continue watching for feeding cues.    Mother's breasts are soft and non tender with short shafted nipples.  Provided hand pump and breast shells with instructions for use.  Mother will start using breast shells and call for assistance as needed with next feeding.  RN in room and aware of baby's condition.  Family present and supportive.  Mom made aware of O/P services, breastfeeding support groups, community resources, and our phone # for post-discharge questions. Mom made aware of O/P services, breastfeeding support groups, community resources, and our phone # for post-discharge questions.    Maternal Data Formula Feeding for Exclusion: No Has patient been taught Hand Expression?: Yes Does the patient have breastfeeding experience prior to this delivery?: No  Feeding Feeding Type: Breast Fed Length of feed: 0 min  LATCH Score Latch: Too sleepy or reluctant, no latch achieved, no sucking elicited.  Audible Swallowing: None  Type of Nipple: Everted at rest and after stimulation(short shafted)  Comfort (Breast/Nipple): Soft / non-tender  Hold (Positioning): Assistance needed to correctly position infant at breast and maintain latch.  LATCH Score: 5  Interventions    Lactation Tools Discussed/Used Tools:  Shells Shell Type: Inverted   Consult Status Consult Status: Follow-up Date: 10/09/17 Follow-up type: In-patient    Dora SimsBeth R Lekeith Wulf 10/08/2017, 9:17 PM

## 2017-10-08 NOTE — Lactation Note (Signed)
This note was copied from a baby's chart. Lactation Consultation Note  Patient Name: Helen Anselmo RodCheyenne White ZOXWR'UToday's Date: 10/08/2017 Reason for consult: Initial assessment;Primapara;1st time breastfeeding;Term  P1 mother whose infant is now 4710 hours old.  Baby awake and getting hearing screen completed and mother ready to eat dinner.  Mother stated that baby has not latched since birth and I offered to assist when the hearing screen and her dinner are complete.  Mother accepted.  Will check back soon.     Maternal Data Formula Feeding for Exclusion: No Has patient been taught Hand Expression?: Yes Does the patient have breastfeeding experience prior to this delivery?: No  Feeding    LATCH Score                   Interventions    Lactation Tools Discussed/Used     Consult Status Consult Status: Follow-up Date: 10/09/17 Follow-up type: In-patient    Helen White 10/08/2017, 8:25 PM

## 2017-10-08 NOTE — Progress Notes (Signed)
   Helen White is a 22 y.o. G1P0 at 474w0d  admitted for rupture of membranes.  Subjective: Doing well; resting in bed. Comfortable with epidural.   Objective: Vitals:   10/08/17 0000 10/08/17 0030 10/08/17 0100 10/08/17 0130  BP: 115/60 (!) 112/49 (!) 119/54 122/60  Pulse: (!) 113 (!) 110 (!) 104 (!) 101  Resp: 18 18 18 18   Temp: 99.1 F (37.3 C)     TempSrc: Oral     SpO2:      Weight:      Height:       Total I/O In: -  Out: 750 [Urine:750]  FHT:  FHR: 125 bpm, variability: moderate,  accelerations:  Present,  decelerations:  Absent UC:   irregular, every 2-3 minutes.  SVE:   Dilation: 8 Effacement (%): 70 Station: 0 Exam by:: White, CNM Pitocin @ 7 mu/min MVU-170 but patient is making change.  FSE in  Labs: Lab Results  Component Value Date   WBC 10.8 (H) 10/07/2017   HGB 12.5 10/07/2017   HCT 36.8 10/07/2017   MCV 89.3 10/07/2017   PLT 243 10/07/2017    Assessment / Plan: Induction of labor due to postterm and SROM,  progressing well on pitocin MVU 170 but patient is progressing; noticeable fetal descent.  FHR is now Cat 1 on 7 mu of pitocin; will continue to increase pitocin slowly and keep patient supine as FHR drops whenever patient is side-lying.  Labor: Progressing normally Fetal Wellbeing:  Category I Pain Control:  Epidural Anticipated MOD:  NSVD  Helen White 10/08/2017, 2:00 AM

## 2017-10-08 NOTE — Anesthesia Postprocedure Evaluation (Signed)
Anesthesia Post Note  Patient: Helen SloughCheyenne Lynn White  Procedure(s) Performed: AN AD HOC LABOR EPIDURAL     Patient location during evaluation: Mother Baby Anesthesia Type: Epidural Level of consciousness: awake Pain management: satisfactory to patient Vital Signs Assessment: post-procedure vital signs reviewed and stable Respiratory status: spontaneous breathing Cardiovascular status: stable Anesthetic complications: no    Last Vitals:  Vitals:   10/08/17 1147 10/08/17 1300  BP: 113/64 102/69  Pulse: (!) 122 (!) 111  Resp: 20 20  Temp: 36.7 C 36.7 C  SpO2:      Last Pain:  Vitals:   10/08/17 1430  TempSrc:   PainSc: 0-No pain   Pain Goal:                 KeyCorpBURGER,Carolie Mcilrath

## 2017-10-09 NOTE — Lactation Note (Addendum)
This note was copied from a baby's chart. Lactation Consultation Note: Infant is 2129 hours old. Infant is slightly jaundice. Staff nurse reports that infant has been spitting mucus and colostrum when last spoon fed.  Staff nurse sat up a DEBP and assist mother with pumping. Staff nurse reports that mother pumped and infant was given 7 ml ebm with a curved tip syringe.   Mother breast tissue compressible with semi flat nipple . Mother taught to hand express and to firm nipple piror to latch infant.   Infant was placed at mothers breast and hand express ebm . Infant nuzzled and licked several times but no interest in feeding.  Infant was given 8 ml of formula with a curved tip syringe and LC's gloved finger. Parents taught to use curved tip syringe for supplementing infant.  Infant placed back skin to skin with mother. Infant began cuing .   Infant latched on the Rt breast in cross cradle hold. Infant sustained latch for 15-20 mins while I observed. FOB at bedside. Parents taught to observed for strong tugging and swallows.  Mother reports that this is the first good latch with tugging and no nipple pain. Mother advised to continue to cue base feeding and feed at least 8-12 times in 24 hours. Discussed cluster feeding .   Advised mother to hand express and then post pump. Encouraged mother to offer infant additional calories after breastfeeding. Parents report comfort and understanding of using the curved tip syringe for supplementing infant.     Lots of basic teaching with parents.   Patient Name: Helen Anselmo RodCheyenne White ZOXWR'UToday's Date: 10/09/2017 Reason for consult: Follow-up assessment   Maternal Data    Feeding Feeding Type: Breast Fed Length of feed: 15 min(infant remained latched when I left the room)  LATCH Score Latch: Grasps breast easily, tongue down, lips flanged, rhythmical sucking.  Audible Swallowing: A few with stimulation  Type of Nipple: Everted at rest and after  stimulation  Comfort (Breast/Nipple): Soft / non-tender  Hold (Positioning): Assistance needed to correctly position infant at breast and maintain latch.  LATCH Score: 8  Interventions Interventions: Assisted with latch;Skin to skin;Breast massage;Hand express;Breast compression;Adjust position;Support pillows;Position options;Expressed milk;DEBP  Lactation Tools Discussed/Used WIC Program: Yes Pump Review: Setup, frequency, and cleaning;Milk Storage Initiated by:: Raeford Razoreven Efird,RN Date initiated:: 10/09/17   Consult Status Consult Status: Follow-up Date: 10/10/17 Follow-up type: In-patient    Stevan BornKendrick, Aailyah Dunbar [facility] 10/09/2017, 3:19 PM

## 2017-10-09 NOTE — Plan of Care (Signed)
Progressing appropriately. Encouraged to call for assistance as needed, and for LATCH assessment.  

## 2017-10-09 NOTE — Progress Notes (Addendum)
Patient ID: Helen White, female   DOB: [birth year], 22 y.o.   MRN: [id number] POSTPARTUM VAGINAL PROGRESS NOTE  Post Partum Day #1  Subjective:  Helen White is a 22 y.o. G1P1001 s/p SVD at 4539w0d.  She reports she is doing well. No acute events overnight. She denies any problems with ambulating, voiding or po intake. Denies nausea or vomiting.  Pain is well controlled.  Lochia is light. Ice pack still in place.   Female "Algis GreenhouseRyleigh Kate"  breastfeeding / Circumcision: N/A   Objective: Blood pressure 109/69, pulse 94, temperature 97.8 F (36.6 C), temperature source Oral, resp. rate 18, height 5\' 4"  (1.626 m), weight 195 lb (88.5 kg), last menstrual period 12/25/2016, SpO2 98 %, currently breastfeeding.  Physical Exam:  General: alert, cooperative and no distress Chest: no respiratory distress Heart:regular rate, distal pulses intact Abdomen: soft, nontender,  Uterine Fundus: firm, appropriately tender, U-1 DVT Evaluation: No calf swelling or tenderness Extremities: Mild edema Skin: warm, dry  Recent Labs    10/07/17 0841  HGB 12.5  HCT 36.8    Assessment/Plan: Helen White is a 22 y.o. G1P1001 s/p SVD at 5439w0d   PPD#1 - Doing well  Routine postpartum care  Contraception: depo vs POPs Feeding: breast Dispo: Plan for discharge tomorrow.   LOS: 2 days   Raelyn Moraolitta Capone Schwinn, CNM  10/09/2017, 10:45 AM

## 2017-10-10 MED ORDER — IBUPROFEN 600 MG PO TABS: 600.0000 mg | ORAL_TABLET | Freq: Four times a day (QID) | ORAL | 0 refills | Status: DC

## 2017-10-10 NOTE — Discharge Summary (Addendum)
OB Discharge Summary     [facility] DOB: 07/31/1995 MRN: [id number]  Date of admission: 10/07/2017 Delivering MD: Cam HaiSHAW, Florita Nitsch D   Date of discharge: 10/10/2017  Admitting diagnosis: 40.6wks, possible water leaking Intrauterine pregnancy: 4059w0d     Secondary diagnosis:  Active Problems:   Post-dates pregnancy  Additional problems: none     Discharge diagnosis: Term Pregnancy Delivered                                                                                                Post partum procedures:none  Augmentation: Pitocin  Complications: None  Hospital course:  Onset of Labor With Vaginal Delivery     22 y.o. yo G1P1001 at 6259w0d was admitted in Latent Labor on 10/07/2017. Patient had an uncomplicated labor course as follows:  Membrane Rupture Time/Date: 8:30 AM ,10/07/2017   Intrapartum Procedures: Episiotomy: None [1]                                         Lacerations:  Labial [10]  Patient had a delivery of a Viable infant. 10/08/2017  Information for the patient's newborn:  Harrison MonsSwink, [facility] [[id number]][[id number]]  Delivery Method: Vaginal, Spontaneous(Filed from Delivery Summary)    Pateint had an uncomplicated postpartum course.  She is ambulating, tolerating a regular diet, passing flatus, and urinating well. Patient is discharged home in stable condition on 10/10/17.   Physical exam  Vitals:   10/09/17 0549 10/09/17 1405 10/09/17 2236 10/10/17 0532  BP: 109/69 105/61 (!) 104/53 99/61  Pulse: 94 98 93 90  Resp: 18 20 (!) 22 18  Temp: 97.8 F (36.6 C) 98 F (36.7 C) 97.8 F (36.6 C) 97.6 F (36.4 C)  TempSrc: Oral Oral Oral Oral  SpO2:  98%  100%  Weight:      Height:       General: alert, cooperative and no distress Lochia: appropriate Uterine Fundus: firm Incision: N/A DVT Evaluation: No evidence of DVT seen on physical exam. Labs: Lab Results  Component Value Date   WBC 10.8 (H) 10/07/2017   HGB 12.5 10/07/2017   HCT 36.8  10/07/2017   MCV 89.3 10/07/2017   PLT 243 10/07/2017   No flowsheet data found.  Discharge instruction: per After Visit Summary and "Baby and Me Booklet".  After visit meds:  Allergies as of 10/10/2017      Reactions   Penicillins    Has patient had a PCN reaction causing immediate rash, facial/tongue/throat swelling, SOB or lightheadedness with hypotension: No Has patient had a PCN reaction causing severe rash involving mucus membranes or skin necrosis: No Has patient had a PCN reaction that required hospitalization: No Has patient had a PCN reaction occurring within the last 10 years: No If all of the above answers are "NO", then may proceed with Cephalosporin use.      Medication List    TAKE these medications   acetaminophen 325 MG tablet Commonly known as:  TYLENOL Take 650 mg  by mouth every 6 (six) hours as needed for headache.   DHA COMPLETE PO Take 1 tablet by mouth at bedtime.   ibuprofen 600 MG tablet Commonly known as:  ADVIL,MOTRIN Take 1 tablet (600 mg total) by mouth every 6 (six) hours.   loratadine 10 MG tablet Commonly known as:  CLARITIN Take 1 tablet (10 mg total) by mouth daily.   prenatal multivitamin Tabs tablet Take 1 tablet by mouth at bedtime.   ranitidine 150 MG tablet Commonly known as:  ZANTAC Take 1 tablet (150 mg total) by mouth 2 (two) times daily.   zolpidem 5 MG tablet Commonly known as:  AMBIEN Take 1 tablet (5 mg total) by mouth at bedtime as needed for sleep.       Diet: routine diet  Activity: Advance as tolerated. Pelvic rest for 6 weeks.   Outpatient follow up:4 weeks Follow up Appt: Future Appointments  Date Time Provider Department Center  11/05/2017  1:15 PM Constant, Gigi Gin, MD [facility] None   Follow up Visit:No follow-ups on file.  Postpartum contraception: Depo Provera  Newborn Data: Live born female  Birth Weight: 7 lb 14.1 oz (3575 g) APGAR: 7, 9  Newborn Delivery   Birth date/time:  10/08/2017  09:16:00 Delivery type:  Vaginal, Spontaneous     Baby Feeding: Breast Disposition:home with mother   10/10/2017 Sandre Kitty, MD   I spoke with and examined patient and agree with resident/PA/SNM's note and plan of care.  Cheral Marker, CNM, [facility] 10/15/2017 11:24 AM

## 2017-10-10 NOTE — Discharge Instructions (Signed)
Vaginal Delivery, Care After °Refer to this sheet in the next few weeks. These instructions provide you with information about caring for yourself after vaginal delivery. Your health care provider may also give you more specific instructions. Your treatment has been planned according to current medical practices, but problems sometimes occur. Call your health care provider if you have any problems or questions. °What can I expect after the procedure? °After vaginal delivery, it is common to have: °· Some bleeding from your vagina. °· Soreness in your abdomen, your vagina, and the area of skin between your vaginal opening and your anus (perineum). °· Pelvic cramps. °· Fatigue. ° °Follow these instructions at home: °Medicines °· Take over-the-counter and prescription medicines only as told by your health care provider. °· If you were prescribed an antibiotic medicine, take it as told by your health care provider. Do not stop taking the antibiotic until it is finished. °Driving ° °· Do not drive or operate heavy machinery while taking prescription pain medicine. °· Do not drive for 24 hours if you received a sedative. °Lifestyle °· Do not drink alcohol. This is especially important if you are breastfeeding or taking medicine to relieve pain. °· Do not use tobacco products, including cigarettes, chewing tobacco, or e-cigarettes. If you need help quitting, ask your health care provider. °Eating and drinking °· Drink at least 8 eight-ounce glasses of water every day unless you are told not to by your health care provider. If you choose to breastfeed your baby, you may need to drink more water than this. °· Eat high-fiber foods every day. These foods may help prevent or relieve constipation. High-fiber foods include: °? Whole grain cereals and breads. °? Brown rice. °? Beans. °? Fresh fruits and vegetables. °Activity °· Return to your normal activities as told by your health care provider. Ask your health care provider  what activities are safe for you. °· Rest as much as possible. Try to rest or take a nap when your baby is sleeping. °· Do not lift anything that is heavier than your baby or 10 lb (4.5 kg) until your health care provider says that it is safe. °· Talk with your health care provider about when you can engage in sexual activity. This may depend on your: °? Risk of infection. °? Rate of healing. °? Comfort and desire to engage in sexual activity. °Vaginal Care °· If you have an episiotomy or a vaginal tear, check the area every day for signs of infection. Check for: °? More redness, swelling, or pain. °? More fluid or blood. °? Warmth. °? Pus or a bad smell. °· Do not use tampons or douches until your health care provider says this is safe. °· Watch for any blood clots that may pass from your vagina. These may look like clumps of dark red, brown, or black discharge. °General instructions °· Keep your perineum clean and dry as told by your health care provider. °· Wear loose, comfortable clothing. °· Wipe from front to back when you use the toilet. °· Ask your health care provider if you can shower or take a bath. If you had an episiotomy or a perineal tear during labor and delivery, your health care provider may tell you not to take baths for a certain length of time. °· Wear a bra that supports your breasts and fits you well. °· If possible, have someone help you with household activities and help care for your baby for at least a few days after   you leave the hospital. °· Keep all follow-up visits for you and your baby as told by your health care provider. This is important. °Contact a health care provider if: °· You have: °? Vaginal discharge that has a bad smell. °? Difficulty urinating. °? Pain when urinating. °? A sudden increase or decrease in the frequency of your bowel movements. °? More redness, swelling, or pain around your episiotomy or vaginal tear. °? More fluid or blood coming from your episiotomy or  vaginal tear. °? Pus or a bad smell coming from your episiotomy or vaginal tear. °? A fever. °? A rash. °? Little or no interest in activities you used to enjoy. °? Questions about caring for yourself or your baby. °· Your episiotomy or vaginal tear feels warm to the touch. °· Your episiotomy or vaginal tear is separating or does not appear to be healing. °· Your breasts are painful, hard, or turn red. °· You feel unusually sad or worried. °· You feel nauseous or you vomit. °· You pass large blood clots from your vagina. If you pass a blood clot from your vagina, save it to show to your health care provider. Do not flush blood clots down the toilet without having your health care provider look at them. °· You urinate more than usual. °· You are dizzy or light-headed. °· You have not breastfed at all and you have not had a menstrual period for 12 weeks after delivery. °· You have stopped breastfeeding and you have not had a menstrual period for 12 weeks after you stopped breastfeeding. °Get help right away if: °· You have: °? Pain that does not go away or does not get better with medicine. °? Chest pain. °? Difficulty breathing. °? Blurred vision or spots in your vision. °? Thoughts about hurting yourself or your baby. °· You develop pain in your abdomen or in one of your legs. °· You develop a severe headache. °· You faint. °· You bleed from your vagina so much that you fill two sanitary pads in one hour. °This information is not intended to replace advice given to you by your health care provider. Make sure you discuss any questions you have with your health care provider. °Document Released: 03/24/2000 Document Revised: 09/08/2015 Document Reviewed: 04/11/2015 °Elsevier Interactive Patient Education © 2018 [employer] ° °

## 2017-10-10 NOTE — Lactation Note (Signed)
This note was copied from a baby's chart. Lactation Consultation Note: Father request assistance for mother to latch infant. Mother doing skin to skin when I arrived in the room.  Mother reports that she has not been unable to latch infant without staff  assistance since birth. Mother was given lots of teaching on getting infant latched, positioning and pillow support. Father at mothers side. Father is supportive and willing to assist mother in any way.   Advised mother in proper positioning and firming her nipple before latching infant. Infant is sleepy at present. Father holding infant to rouse infant for feeding.   Assist mother with hand expression. Mothers breast are filling. She is able to express large drops of colostrum. Mothers nipple tissue is semi-flat but soft compressible.   Reviewed tea-cup hold again with mother. [facility]. Father taught to do hands on and assist mother.   Mother was fit with a #24 nipple shield due to flat tissue. Advised in proper application and NS being a barrier if infant not latched correctly. advised mother to use only if unable to get infant latched to bare breast.   Infant latched on for good deep suckles. Nipple shield removed and infant sustained latch on the base breast for 15-20 mins. Observed rhythmic suckling and swallows. Reviewed breast compression again.   Mother has a Medela pump at home. She was advised to post pump after feeding. Suggested that mother pump after each feeding and use her ebm to supplement with.   Encouraged mother to keep accurate account of all wets and dirties. Mother advised to continue to cue base feed and to feed 8-12 times in 24 hours. Discussed cluster feeding. [facility]  Patient Name: Helen White's Date: 10/10/2017 Reason for consult: Follow-up assessment   Maternal Data    Feeding Feeding Type: Breast Fed Length of  feed: 10 min  LATCH Score Latch: Grasps breast easily, tongue down, lips flanged, rhythmical sucking.  Audible Swallowing: A few with stimulation  Type of Nipple: Everted at rest and after stimulation  Comfort (Breast/Nipple): Soft / non-tender  Hold (Positioning): Assistance needed to correctly position infant at breast and maintain latch.  LATCH Score: 8  Interventions Interventions: Assisted with latch;Skin to skin;Hand express;Breast compression;Adjust position;Support pillows;Position options;Expressed milk;Hand pump;DEBP  Lactation Tools Discussed/Used     Consult Status      Helen White, Helen White 10/10/2017, 11:19 AM

## 2017-10-10 NOTE — Lactation Note (Signed)
This note was copied from a baby's chart. Lactation Consultation Note  Patient Name: Helen White ZOXWR'UToday's Date: 10/10/2017 Reason for consult: Follow-up assessment;Term  P1 mother whose infant is now 7241 hours old.  RN Request for Latch Assistance:  Father holding baby STS as I arrived.  Mother not interested in latching right now and baby was not showing any feeding cues.  Mother very tired stating she has not slept since Saturday.  I offered to swaddle baby so parents could rest.  Infant swaddled and placed on back in bassinet.  Baby fell back to sleep.  Encouraged mother to call for assistance as needed.  RN updated.   Maternal Data Formula Feeding for Exclusion: No Has patient been taught Hand Expression?: Yes Does the patient have breastfeeding experience prior to this delivery?: No  Feeding    LATCH Score                   Interventions    Lactation Tools Discussed/Used     Consult Status Consult Status: Follow-up Date: 10/11/17 Follow-up type: In-patient    Militza Devery R Eric Morganti 10/10/2017, 2:20 AM

## 2017-11-05 ENCOUNTER — Ambulatory Visit (INDEPENDENT_AMBULATORY_CARE_PROVIDER_SITE_OTHER): Payer: Medicaid Other | Admitting: Obstetrics and Gynecology

## 2017-11-05 ENCOUNTER — Encounter: Payer: Self-pay | Admitting: Obstetrics and Gynecology

## 2017-11-05 DIAGNOSIS — Z3042 Encounter for surveillance of injectable contraceptive: Secondary | ICD-10-CM | POA: Diagnosis not present

## 2017-11-05 DIAGNOSIS — Z1389 Encounter for screening for other disorder: Secondary | ICD-10-CM

## 2017-11-05 MED ORDER — MEDROXYPROGESTERONE ACETATE 150 MG/ML IM SUSP
150.0000 mg | Freq: Once | INTRAMUSCULAR | Status: AC
  Administered 2017-11-05: 150 mg via INTRAMUSCULAR

## 2017-11-05 MED ORDER — MEDROXYPROGESTERONE ACETATE 150 MG/ML IM SUSP: 150.0000 mg | INTRAMUSCULAR | 0 refills | Status: DC

## 2017-11-05 NOTE — Progress Notes (Signed)
Post Partum Exam  Helen SloughCheyenne Lynn Harrison White is a [age] y.o. 791P1001 female who presents for a postpartum visit. She is 1 week postpartum following a spontaneous vaginal delivery. I have fully reviewed the prenatal and intrapartum course. The delivery was at [redacted] wks gestational weeks.  Anesthesia: epidural. Postpartum course has been unremarkable. Baby's course has been unremarkable. Baby is feeding by bottle - Similac ProSensitive. . Bleeding no bleeding. Bowel function is normal. Bladder function is normal. Patient is not sexually active. Contraception method is abstinence. Postpartum depression screening:neg EPDS=2    Last pap smear done 04/2017 and was Normal  Review of Systems Pertinent items are noted in HPI.    Objective:  Blood pressure 106/70, pulse 83, height 5\' 4"  (1.626 m), weight 176 lb 4.8 oz (80 kg), unknown if currently breastfeeding.  General:  alert, cooperative and no distress   Breasts:  inspection negative, no nipple discharge or bleeding, no masses or nodularity palpable  Lungs: clear to auscultation bilaterally  Heart:  regular rate and rhythm  Abdomen: soft, non-tender; bowel sounds normal; no masses,  no organomegaly   Vulva:  normal  Vagina: normal vagina, no discharge, exudate, lesion, or erythema  Cervix:  multiparous appearance  Corpus: normal size, contour, position, consistency, mobility, non-tender  Adnexa:  normal adnexa and no mass, fullness, tenderness  Rectal Exam: Not performed.        Assessment:    Normal postpartum exam. Pap smear not done at today's visit.   Plan:   1. Contraception: Depo-Provera injections 2. Patient is medically cleared to resume all activities of daily living 3. Follow up in: 6 months for annual exam or as needed.

## 2018-01-28 ENCOUNTER — Ambulatory Visit: Payer: Medicaid Other

## 2018-02-04 ENCOUNTER — Ambulatory Visit (INDEPENDENT_AMBULATORY_CARE_PROVIDER_SITE_OTHER): Payer: Medicaid Other

## 2018-02-04 VITALS — Wt 179.7 lb

## 2018-02-04 DIAGNOSIS — Z3042 Encounter for surveillance of injectable contraceptive: Secondary | ICD-10-CM | POA: Diagnosis not present

## 2018-02-04 MED ORDER — MEDROXYPROGESTERONE ACETATE 150 MG/ML IM SUSP
150.0000 mg | Freq: Once | INTRAMUSCULAR | Status: AC
  Administered 2018-02-04: 150 mg via INTRAMUSCULAR

## 2018-02-04 NOTE — Progress Notes (Signed)
Pt is here for depo injection. Injection is on time. Depo inj given in L arm, pt tolerated well. Pt instructed to come back in 3 months for next injection, verbalized understanding.

## 2018-03-13 ENCOUNTER — Ambulatory Visit (HOSPITAL_COMMUNITY)
Admission: EM | Admit: 2018-03-13 | Discharge: 2018-03-13 | Disposition: A | Discharge status: Home / Self Care | Payer: Medicaid Other | Attending: Family Medicine | Admitting: Family Medicine

## 2018-03-13 ENCOUNTER — Encounter (HOSPITAL_COMMUNITY): Payer: Self-pay

## 2018-03-13 DIAGNOSIS — M7918 Myalgia, other site: Secondary | ICD-10-CM

## 2018-03-13 DIAGNOSIS — R6889 Other general symptoms and signs: Secondary | ICD-10-CM

## 2018-03-13 DIAGNOSIS — R05 Cough: Secondary | ICD-10-CM

## 2018-03-13 DIAGNOSIS — J029 Acute pharyngitis, unspecified: Secondary | ICD-10-CM

## 2018-03-13 DIAGNOSIS — R5383 Other fatigue: Secondary | ICD-10-CM

## 2018-03-13 MED ORDER — BENZONATATE 100 MG PO CAPS: 100.0000 mg | ORAL_CAPSULE | Freq: Three times a day (TID) | ORAL | 0 refills | Status: DC

## 2018-03-13 NOTE — Discharge Instructions (Signed)
Get plenty of rest and push fluids °Tessalon Perles prescribed for cough °Use OTC medications like ibuprofen or tylenol as needed fever or pain °Follow up with PCP if symptoms persist °Return or go to ER if you have any new or worsening symptoms fever, chills, nausea, vomiting, chest pain, cough, shortness of breath, wheezing, abdominal pain, changes in bowel or bladder habits, etc... °

## 2018-03-13 NOTE — ED Triage Notes (Signed)
Pt presents with fever, chills, persistent cough, sore throat, congestion, and generalized body aches.

## 2018-03-13 NOTE — ED Provider Notes (Signed)
[facility]   [id number] 03/13/18 Arrival Time: 1950   CC: URI symptoms   SUBJECTIVE: History from: patient.  Reuel BoomCheyenne Larita FifeLynn Sciarra is a 22 y.o. female who presents with abrupt onset of fever, with tmax of 101, chills, fatigue, body aches, cough, sore throat, and nasal congestion that began abruptly 4 hours ago.  Admits to positive sick exposure to husband with similar symptoms.  Has tried OTC medications without relief.  Denies aggravating factors. Reports previous symptoms in the past and diagnosed with the flu.   Denies rhinorrhea, SOB, wheezing, chest pain, nausea, changes in bowel or bladder habits.    Received flu shot this year: no.  ROS: As per HPI.  Past Medical History:  Diagnosis Date  . Medical history non-contributory    Past Surgical History:  Procedure Laterality Date  . NO PAST SURGERIES     Allergies  Allergen Reactions  . Penicillins     Has patient had a PCN reaction causing immediate rash, facial/tongue/throat swelling, SOB or lightheadedness with hypotension: No Has patient had a PCN reaction causing severe rash involving mucus membranes or skin necrosis: No Has patient had a PCN reaction that required hospitalization: No Has patient had a PCN reaction occurring within the last 10 years: No If all of the above answers are "NO", then may proceed with Cephalosporin use.    No current facility-administered medications on file prior to encounter.    Current Outpatient Medications on File Prior to Encounter  Medication Sig Dispense Refill  . acetaminophen (TYLENOL) 325 MG tablet Take 650 mg by mouth every 6 (six) hours as needed for headache.     . Docosahexaenoic Acid (DHA COMPLETE PO) Take 1 tablet by mouth at bedtime.     . medroxyPROGESTERone (DEPO-PROVERA) 150 MG/ML injection Inject 1 mL (150 mg total) into the muscle every 3 (three) months. 1 mL 0  . Prenatal Vit-Fe Fumarate-FA (PRENATAL MULTIVITAMIN) TABS tablet Take 1 tablet by mouth at  bedtime.      Social History   Socioeconomic History  . Marital status: Married    Spouse name: Not on file  . Number of children: Not on file  . Years of education: Not on file  . Highest education level: Not on file  Occupational History  . Not on file  Social Needs  . Financial resource strain: Not on file  . Food insecurity:    Worry: Not on file    Inability: Not on file  . Transportation needs:    Medical: Not on file    Non-medical: Not on file  Tobacco Use  . Smoking status: Never Smoker  . Smokeless tobacco: Never Used  Substance and Sexual Activity  . Alcohol use: No  . Drug use: No  . Sexual activity: Not Currently  Lifestyle  . Physical activity:    Days per week: Not on file    Minutes per session: Not on file  . Stress: Not on file  Relationships  . Social connections:    Talks on phone: Not on file    Gets together: Not on file    Attends religious service: Not on file    Active member of club or organization: Not on file    Attends meetings of clubs or organizations: Not on file    Relationship status: Not on file  . Intimate partner violence:    Fear of current or ex partner: Not on file    Emotionally abused: Not on file  Physically abused: Not on file    Forced sexual activity: Not on file  Other Topics Concern  . Not on file  Social History Narrative  . Not on file   History reviewed. No pertinent family history.  OBJECTIVE:  Vitals:   03/13/18 2013  BP: 126/65  Pulse: (!) 136  Resp: 20  Temp: 99.5 F (37.5 C)  TempSrc: Oral  SpO2: 99%     General appearance: alert; appears fatigued, but nontoxic; speaking in full sentences and tolerating own secretions HEENT: NCAT; Ears: EACs clear, TMs pearly gray; Eyes: PERRL.  EOM grossly intact. Nose: nares patent erythematous with mild rhinorrhea, Throat: oropharynx clear, tonsils non erythematous or enlarged, uvula midline  Neck: supple without LAD Lungs: unlabored respirations,  symmetrical air entry; cough: mild; no respiratory distress; CTAB Heart: tachycardic.  Radial pulses 2+ symmetrical bilaterally; 120 bpm Skin: warm and dry Psychological: alert and cooperative; normal mood and affect  ASSESSMENT & PLAN:  1. Flu-like symptoms     Meds ordered this encounter  Medications  . benzonatate (TESSALON) 100 MG capsule    Sig: Take 1 capsule (100 mg total) by mouth every 8 (eight) hours.    Dispense:  21 capsule    Refill:  0    Order Specific Question:   Supervising Provider    Answer:   Eustace Moore [[id number]]   Get plenty of rest and push fluids Tessalon Perles prescribed for cough Use OTC medications like ibuprofen or tylenol as needed fever or pain Follow up with PCP if symptoms persist Return or go to ER if you have any new or worsening symptoms fever, chills, nausea, vomiting, chest pain, cough, shortness of breath, wheezing, abdominal pain, changes in bowel or bladder habits, etc...  Reviewed expectations re: course of current medical issues. Questions answered. Outlined signs and symptoms indicating need for more acute intervention. Patient verbalized understanding. After Visit Summary given.         Rennis Harding, PA-C 03/13/18 2101

## 2018-03-17 ENCOUNTER — Encounter (HOSPITAL_COMMUNITY): Payer: Self-pay | Admitting: Emergency Medicine

## 2018-03-17 ENCOUNTER — Ambulatory Visit (HOSPITAL_COMMUNITY)
Admission: EM | Admit: 2018-03-17 | Discharge: 2018-03-17 | Disposition: A | Discharge status: Home / Self Care | Payer: Medicaid Other | Attending: Physician Assistant | Admitting: Physician Assistant

## 2018-03-17 ENCOUNTER — Other Ambulatory Visit: Payer: Self-pay | Admitting: Obstetrics and Gynecology

## 2018-03-17 DIAGNOSIS — J069 Acute upper respiratory infection, unspecified: Secondary | ICD-10-CM

## 2018-03-17 DIAGNOSIS — B9789 Other viral agents as the cause of diseases classified elsewhere: Secondary | ICD-10-CM

## 2018-03-17 DIAGNOSIS — R05 Cough: Secondary | ICD-10-CM

## 2018-03-17 MED ORDER — FLUTICASONE PROPIONATE 50 MCG/ACT NA SUSP: 2.0000 | Freq: Every day | NASAL | 0 refills | Status: DC

## 2018-03-17 MED ORDER — IPRATROPIUM BROMIDE 0.06 % NA SOLN: 2.0000 | Freq: Four times a day (QID) | NASAL | 0 refills | Status: DC

## 2018-03-17 MED ORDER — PREDNISONE 50 MG PO TABS: 50.0000 mg | ORAL_TABLET | Freq: Every day | ORAL | 0 refills | Status: DC

## 2018-03-17 NOTE — ED Triage Notes (Signed)
Pt c/o flu symptoms, was here 12/4, states she still feels sick.

## 2018-03-17 NOTE — Discharge Instructions (Signed)
You can continue tessalon for cough. Prednisone as directed. Start flonase, atrovent nasal spray for nasal congestion/drainage. You can use over the counter nasal saline rinse such as neti pot for nasal congestion. Keep hydrated, your urine should be clear to pale yellow in color. Tylenol/motrin for fever and pain. Monitor for any worsening of symptoms, chest pain, shortness of breath, one sided leg swelling, weakness, dizziness, go to the emergency department for further evaluation needed.   For sore throat/cough try using a honey-based tea. Use 3 teaspoons of honey with juice squeezed from half lemon. Place shaved pieces of ginger into 1/2-1 cup of water and warm over stove top. Then mix the ingredients and repeat every 4 hours as needed.

## 2018-03-17 NOTE — ED Provider Notes (Signed)
[facility]    CSN: [id number] Arrival date & time: 03/17/18  1005     History   Chief Complaint Chief Complaint  Patient presents with  . Cough    HPI Helen SloughCheyenne Lynn Harrison White is a 22 y.o. female.   22 year old female comes in for continued URI symptoms.  She was seen 4 days ago for flulike symptoms, at that time, declined Tamiflu due to financials, and was given Tessalon for cough.  She has had continued cough, nasal congestion, rhinorrhea.  Now with some chest soreness from coughing.  Denies shortness of breath or wheezing.  Has had resolved fever.  Able to eat and drink without difficulty.  Never smoker.  Depoprovera IM for birth control, no long travels, no history of blood clots. Denies one sided leg swelling.      Past Medical History:  Diagnosis Date  . Medical history non-contributory     Patient Active Problem List   Diagnosis Date Noted  . Post-dates pregnancy 10/07/2017  . Supervision of normal first pregnancy, antepartum 05/11/2017    Past Surgical History:  Procedure Laterality Date  . NO PAST SURGERIES      OB History    Gravida  1   Para  1   Term  1   Preterm      AB      Living  1     SAB      TAB      Ectopic      Multiple  0   Live Births  1            Home Medications    Prior to Admission medications   Medication Sig Start Date End Date Taking? Authorizing Provider  acetaminophen (TYLENOL) 325 MG tablet Take 650 mg by mouth every 6 (six) hours as needed for headache.     [provider]  benzonatate (TESSALON) 100 MG capsule Take 1 capsule (100 mg total) by mouth every 8 (eight) hours. 03/13/18   Wurst, [place], PA-C  Docosahexaenoic Acid (DHA COMPLETE PO) Take 1 tablet by mouth at bedtime.     [provider]  fluticasone (FLONASE) 50 MCG/ACT nasal spray Place 2 sprays into both nostrils daily. 03/17/18   Cathie HoopsYu, Meshelle Holness V, PA-C  ipratropium (ATROVENT) 0.06 % nasal spray Place 2 sprays into both  nostrils 4 (four) times daily. 03/17/18   Cathie HoopsYu, Redina Zeller V, PA-C  medroxyPROGESTERone (DEPO-PROVERA) 150 MG/ML injection Inject 1 mL (150 mg total) into the muscle every 3 (three) months. 11/05/17   Constant, Peggy, MD  predniSONE (DELTASONE) 50 MG tablet Take 1 tablet (50 mg total) by mouth daily. 03/17/18   Belinda FisherYu, Gail Creekmore V, PA-C  Prenatal Vit-Fe Fumarate-FA (PRENATAL MULTIVITAMIN) TABS tablet Take 1 tablet by mouth at bedtime.     [provider]    Family History History reviewed. No pertinent family history.  Social History Social History   Tobacco Use  . Smoking status: Never Smoker  . Smokeless tobacco: Never Used  Substance Use Topics  . Alcohol use: No  . Drug use: No     Allergies   Penicillins   Review of Systems Review of Systems  Reason unable to perform ROS: See HPI as above.     Physical Exam Triage Vital Signs ED Triage Vitals [03/17/18 1019]  Enc Vitals Group     BP 112/77     Pulse Rate (!) 114     Resp 16     Temp 97.8 F (  36.6 C)     Temp src      SpO2 98 %     Weight      Height      Head Circumference      Peak Flow      Pain Score 4     Pain Loc      Pain Edu?      Excl. in GC?    No data found.  Updated Vital Signs BP 112/77   Pulse (!) 114   Temp 97.8 F (36.6 C)   Resp 16   SpO2 98%    Physical Exam  Constitutional: She is oriented to person, place, and time. She appears well-developed and well-nourished. No distress.  HENT:  Head: Normocephalic and atraumatic.  Right Ear: Tympanic membrane, external ear and ear canal normal. Tympanic membrane is not erythematous and not bulging.  Left Ear: Tympanic membrane, external ear and ear canal normal. Tympanic membrane is not erythematous and not bulging.  Nose: Nose normal. Right sinus exhibits no maxillary sinus tenderness and no frontal sinus tenderness. Left sinus exhibits no maxillary sinus tenderness and no frontal sinus tenderness.  Mouth/Throat: Uvula is midline, oropharynx is  clear and moist and mucous membranes are normal.  Eyes: Pupils are equal, round, and reactive to light. Conjunctivae are normal.  Neck: Normal range of motion. Neck supple.  Cardiovascular: Regular rhythm and normal heart sounds. Tachycardia present. Exam reveals no gallop and no friction rub.  No murmur heard. Pulmonary/Chest: Effort normal and breath sounds normal. No stridor. No respiratory distress. She has no decreased breath sounds. She has no wheezes. She has no rhonchi. She has no rales.  Coughing throughout exam  Lymphadenopathy:    She has no cervical adenopathy.  Neurological: She is alert and oriented to person, place, and time.  Skin: Skin is warm and dry. She is not diaphoretic.  Psychiatric: She has a normal mood and affect. Her behavior is normal. Judgment normal.     UC Treatments / Results  Labs (all labs ordered are listed, but only abnormal results are displayed) Labs Reviewed - No data to display  EKG None  Radiology No results found.  Procedures Procedures (including critical care time)  Medications Ordered in UC Medications - No data to display  Initial Impression / Assessment and Plan / UC Course  I have reviewed the triage vital signs and the nursing notes.  Pertinent labs & imaging results that were available during my care of the patient were reviewed by me and considered in my medical decision making (see chart for details).    Discussed with patient history and exam most consistent with viral URI. Symptomatic treatment as needed. Push fluids. Return precautions given.   Final Clinical Impressions(s) / UC Diagnoses   Final diagnoses:  Viral URI with cough    ED Prescriptions    Medication Sig Dispense Auth. Provider   predniSONE (DELTASONE) 50 MG tablet Take 1 tablet (50 mg total) by mouth daily. 5 tablet Lacole Komorowski V, PA-C   fluticasone (FLONASE) 50 MCG/ACT nasal spray Place 2 sprays into both nostrils daily. 1 g Latori Beggs V, PA-C    ipratropium (ATROVENT) 0.06 % nasal spray Place 2 sprays into both nostrils 4 (four) times daily. 15 mL Threasa Alpha, [place] 03/17/18 1052

## 2018-04-29 ENCOUNTER — Ambulatory Visit (INDEPENDENT_AMBULATORY_CARE_PROVIDER_SITE_OTHER): Payer: Medicaid Other

## 2018-04-29 ENCOUNTER — Ambulatory Visit: Payer: Medicaid Other

## 2018-04-29 DIAGNOSIS — Z3042 Encounter for surveillance of injectable contraceptive: Secondary | ICD-10-CM

## 2018-04-29 MED ORDER — MEDROXYPROGESTERONE ACETATE 150 MG/ML IM SUSP
150.0000 mg | Freq: Once | INTRAMUSCULAR | Status: AC
  Administered 2018-04-29: 150 mg via INTRAMUSCULAR

## 2018-04-29 NOTE — Progress Notes (Signed)
Nurse visit for pt supply Depo. Pt is on time for inj. She requested injection in L Del; however, the arm was swollen, warm to touch, and bruised. Pt noticed intermittent pain while lifting but didn't think anything about it. Pt denies trauma to the area. Pt informed to apply ice, take OTC IB as directed for pain, and seek medical attention if sx's worsens.  Depo given R Del w/o difficulty. Next Depo due 4/7-4/21, pt agrees.

## 2018-06-17 ENCOUNTER — Ambulatory Visit
Admission: EM | Admit: 2018-06-17 | Discharge: 2018-06-17 | Disposition: A | Discharge status: Home / Self Care | Payer: Medicaid Other | Attending: Family Medicine | Admitting: Family Medicine

## 2018-06-17 ENCOUNTER — Encounter: Payer: Self-pay | Admitting: Emergency Medicine

## 2018-06-17 DIAGNOSIS — J22 Unspecified acute lower respiratory infection: Secondary | ICD-10-CM

## 2018-06-17 MED ORDER — BENZONATATE 200 MG PO CAPS: 200.0000 mg | ORAL_CAPSULE | Freq: Two times a day (BID) | ORAL | 0 refills | Status: DC | PRN

## 2018-06-17 MED ORDER — FLUTICASONE PROPIONATE 50 MCG/ACT NA SUSP: 2.0000 | Freq: Every day | NASAL | 0 refills | Status: DC

## 2018-06-17 MED ORDER — CEPHALEXIN 500 MG PO CAPS: 500.0000 mg | ORAL_CAPSULE | Freq: Two times a day (BID) | ORAL | 0 refills | Status: DC

## 2018-06-17 NOTE — ED Provider Notes (Signed)
[facility]    CSN: [id number] Arrival date & time: 06/17/18  1313     History   Chief Complaint Chief Complaint  Patient presents with  . URI  . Headache    HPI Helen White is a 23 y.o. female.   HPI  Patient states that she has had cough cold congestion for a week.  She states that she has both sinus pressure and pain.  She is developed a "migraine" across her forehead and behind her eyes for the last 2 days.  She has not been able to relieve the pain even with Tylenol or ibuprofen.  She has some nausea.  She has thick sinus drainage.  She has coughing and chest congestion.  The coughing is keeping her awake at night. She does have pre-existing migraines and states this feels typical for her migraine. She has not been exposed to any specific illness.  Past Medical History:  Diagnosis Date  . Medical history non-contributory     Patient Active Problem List   Diagnosis Date Noted  . Post-dates pregnancy 10/07/2017  . Supervision of normal first pregnancy, antepartum 05/11/2017    Past Surgical History:  Procedure Laterality Date  . NO PAST SURGERIES      OB History    Gravida  1   Para  1   Term  1   Preterm      AB      Living  1     SAB      TAB      Ectopic      Multiple  0   Live Births  1            Home Medications    Prior to Admission medications   Medication Sig Start Date End Date Taking? Authorizing Provider  ipratropium (ATROVENT) 0.06 % nasal spray Place 2 sprays into both nostrils 4 (four) times daily. 03/17/18  Yes Yu, Amy V, PA-C  acetaminophen (TYLENOL) 325 MG tablet Take 650 mg by mouth every 6 (six) hours as needed for headache.     [provider]  benzonatate (TESSALON) 200 MG capsule Take 1 capsule (200 mg total) by mouth 2 (two) times daily as needed for cough. 06/17/18   Eustace Moore, MD  cephALEXin (KEFLEX) 500 MG capsule Take 1 capsule (500 mg total) by mouth 2 (two) times daily.  06/17/18   Eustace Moore, MD  Docosahexaenoic Acid (DHA COMPLETE PO) Take 1 tablet by mouth at bedtime.     [provider]  fluticasone (FLONASE) 50 MCG/ACT nasal spray Place 2 sprays into both nostrils daily. 06/17/18   Eustace Moore, MD  medroxyPROGESTERone (DEPO-PROVERA) 150 MG/ML injection INJECT 1 ML INTO THE MUSCLE EVERY 3 MONTHS 03/20/18   Constant, Peggy, MD  Prenatal Vit-Fe Fumarate-FA (PRENATAL MULTIVITAMIN) TABS tablet Take 1 tablet by mouth at bedtime.     [provider]    Family History History reviewed. No pertinent family history.  Social History Social History   Tobacco Use  . Smoking status: Never Smoker  . Smokeless tobacco: Never Used  Substance Use Topics  . Alcohol use: No  . Drug use: No     Allergies   Penicillins   Review of Systems Review of Systems  Constitutional: Positive for fatigue. Negative for chills and fever.  HENT: Positive for congestion, postnasal drip, rhinorrhea, sinus pressure and sinus pain. Negative for ear pain and sore throat.   Eyes: Negative for pain  and visual disturbance.  Respiratory: Positive for cough. Negative for shortness of breath.   Cardiovascular: Negative for chest pain and palpitations.  Gastrointestinal: Positive for nausea. Negative for abdominal pain and vomiting.  Genitourinary: Negative for dysuria and hematuria.  Musculoskeletal: Negative for arthralgias and back pain.  Skin: Negative for color change and rash.  Neurological: Positive for headaches. Negative for seizures and syncope.  All other systems reviewed and are negative.    Physical Exam Triage Vital Signs ED Triage Vitals  Enc Vitals Group     BP 06/17/18 1326 108/68     Pulse Rate 06/17/18 1326 100     Resp 06/17/18 1326 18     Temp 06/17/18 1326 98 F (36.7 C)     Temp Source 06/17/18 1326 Oral     SpO2 06/17/18 1326 98 %     Weight --      Height --      Head Circumference --      Peak Flow --      Pain  Score 06/17/18 1327 7     Pain Loc --      Pain Edu? --      Excl. in GC? --    No data found.  Updated Vital Signs BP 108/68 (BP Location: Left Arm)   Pulse 100   Temp 98 F (36.7 C) (Oral)   Resp 18   SpO2 98%   Visual Acuity Right Eye Distance:   Left Eye Distance:   Bilateral Distance:    Right Eye Near:   Left Eye Near:    Bilateral Near:     Physical Exam Constitutional:      General: She is not in acute distress.    Appearance: She is well-developed. She is ill-appearing.  HENT:     Head: Normocephalic and atraumatic.     Mouth/Throat:     Mouth: Mucous membranes are moist.     Comments: No sinus tenderness.  Posterior pharynx is injected.  Nasal membranes are swollen Eyes:     Extraocular Movements: Extraocular movements intact.     Conjunctiva/sclera: Conjunctivae normal.     Pupils: Pupils are equal, round, and reactive to light.  Neck:     Musculoskeletal: Normal range of motion and neck supple.  Cardiovascular:     Rate and Rhythm: Normal rate and regular rhythm.     Heart sounds: Normal heart sounds.  Pulmonary:     Effort: Pulmonary effort is normal. No respiratory distress.     Breath sounds: Normal breath sounds.  Abdominal:     General: There is no distension.     Palpations: Abdomen is soft.  Musculoskeletal: Normal range of motion.  Lymphadenopathy:     Cervical: Cervical adenopathy present.  Skin:    General: Skin is warm and dry.  Neurological:     Mental Status: She is alert.     Deep Tendon Reflexes: Reflexes normal.  Psychiatric:        Mood and Affect: Mood normal.        Behavior: Behavior normal.      UC Treatments / Results  Labs (all labs ordered are listed, but only abnormal results are displayed) Labs Reviewed - No data to display  EKG None  Radiology No results found.  Procedures Procedures (including critical care time)  Medications Ordered in UC Medications - No data to display  Initial Impression /  Assessment and Plan / UC Course  I have reviewed the triage vital signs  and the nursing notes.  Pertinent labs & imaging results that were available during my care of the patient were reviewed by me and considered in my medical decision making (see chart for details).     Final Clinical Impressions(s) / UC Diagnoses   Final diagnoses:  LRTI (lower respiratory tract infection)     Discharge Instructions     Drink plenty of fluids Take antibiotic 2 times a day for 1 week Use Tessalon for cough Continue to use your Flonase for the nasal and sinus congestion Expect improvement over the next few days   ED Prescriptions    Medication Sig Dispense Auth. Provider   fluticasone (FLONASE) 50 MCG/ACT nasal spray Place 2 sprays into both nostrils daily. 1 g Eustace Moore, MD   benzonatate (TESSALON) 200 MG capsule Take 1 capsule (200 mg total) by mouth 2 (two) times daily as needed for cough. 20 capsule Eustace Moore, MD   cephALEXin (KEFLEX) 500 MG capsule Take 1 capsule (500 mg total) by mouth 2 (two) times daily. 14 capsule Eustace Moore, MD     Controlled Substance Prescriptions [place] Controlled Substance Registry consulted? Not Applicable   Eustace Moore, MD 06/17/18 1349

## 2018-06-17 NOTE — ED Notes (Signed)
Patient able to ambulate independently  

## 2018-06-17 NOTE — ED Triage Notes (Signed)
Pt presents to [facility] for assessment of cough, congestion x 1 week.  States starting two days ago pt began having intense frontal headache.  States it has not gone away with Tylenol or Ibuprofen,.

## 2018-06-17 NOTE — Discharge Instructions (Signed)
Drink plenty of fluids Take antibiotic 2 times a day for 1 week Use Tessalon for cough Continue to use your Flonase for the nasal and sinus congestion Expect improvement over the next few days

## 2018-06-24 ENCOUNTER — Other Ambulatory Visit (HOSPITAL_COMMUNITY)
Admission: RE | Admit: 2018-06-24 | Discharge: 2018-06-24 | Disposition: A | Discharge status: Home / Self Care | Payer: Medicaid Other | Source: Ambulatory Visit | Attending: Obstetrics and Gynecology | Admitting: Obstetrics and Gynecology

## 2018-06-24 ENCOUNTER — Encounter: Payer: Self-pay | Admitting: Obstetrics and Gynecology

## 2018-06-24 ENCOUNTER — Ambulatory Visit (INDEPENDENT_AMBULATORY_CARE_PROVIDER_SITE_OTHER): Payer: Medicaid Other | Admitting: Obstetrics and Gynecology

## 2018-06-24 ENCOUNTER — Other Ambulatory Visit: Payer: Self-pay

## 2018-06-24 VITALS — BP 112/70 | HR 102 | Ht 64.0 in | Wt 190.0 lb

## 2018-06-24 DIAGNOSIS — Z0000 Encounter for general adult medical examination without abnormal findings: Secondary | ICD-10-CM | POA: Diagnosis not present

## 2018-06-24 DIAGNOSIS — Z01419 Encounter for gynecological examination (general) (routine) without abnormal findings: Secondary | ICD-10-CM | POA: Insufficient documentation

## 2018-06-24 MED ORDER — VITAFOL GUMMIES 3.33-0.333-34.8 MG PO CHEW: 2.0000 | CHEWABLE_TABLET | Freq: Every day | ORAL | 12 refills | Status: AC

## 2018-06-24 NOTE — Progress Notes (Signed)
Subjective:     Helen White is a 23 y.o. female P1 with BMI 32 who is here for a comprehensive physical exam. The patient reports no problems. Patient is sexually active without contraception as she desires pregnancy. She denies any pelvic pain or abnormal discharge  Past Medical History:  Diagnosis Date  . Medical history non-contributory    Past Surgical History:  Procedure Laterality Date  . NO PAST SURGERIES     No family history on file.  Social History   Socioeconomic History  . Marital status: Married    Spouse name: Not on file  . Number of children: Not on file  . Years of education: Not on file  . Highest education level: Not on file  Occupational History  . Not on file  Social Needs  . Financial resource strain: Not on file  . Food insecurity:    Worry: Not on file    Inability: Not on file  . Transportation needs:    Medical: Not on file    Non-medical: Not on file  Tobacco Use  . Smoking status: Never Smoker  . Smokeless tobacco: Never Used  Substance and Sexual Activity  . Alcohol use: No  . Drug use: No  . Sexual activity: Yes    Birth control/protection: Injection  Lifestyle  . Physical activity:    Days per week: Not on file    Minutes per session: Not on file  . Stress: Not on file  Relationships  . Social connections:    Talks on phone: Not on file    Gets together: Not on file    Attends religious service: Not on file    Active member of club or organization: Not on file    Attends meetings of clubs or organizations: Not on file    Relationship status: Not on file  . Intimate partner violence:    Fear of current or ex partner: Not on file    Emotionally abused: Not on file    Physically abused: Not on file    Forced sexual activity: Not on file  Other Topics Concern  . Not on file  Social History Narrative  . Not on file   Health Maintenance  Topic Date Due  . PAP-Cervical Cytology Screening  02/21/2016  . PAP  SMEAR-Modifier  02/21/2016  . INFLUENZA VACCINE  11/08/2017  . TETANUS/TDAP  07/10/2027  . HIV Screening  Completed       Review of Systems Pertinent items are noted in HPI.   Objective:  Blood pressure 112/70, pulse (!) 102, height 5\' 4"  (1.626 m), weight 190 lb (86.2 kg), unknown if currently breastfeeding.     GENERAL: Well-developed, well-nourished female in no acute distress.  HEENT: Normocephalic, atraumatic. Sclerae anicteric.  NECK: Supple. Normal thyroid.  LUNGS: Clear to auscultation bilaterally.  HEART: Regular rate and rhythm. BREASTS: Symmetric in size. No palpable masses or lymphadenopathy, skin changes, or nipple drainage. ABDOMEN: Soft, nontender, nondistended. No organomegaly. PELVIC: Normal external female genitalia. Vagina is pink and rugated.  Normal discharge. Normal appearing cervix. Uterus is normal in size. No adnexal mass or tenderness. EXTREMITIES: No cyanosis, clubbing, or edema, 2+ distal pulses.    Assessment:    Healthy female exam.      Plan:    pap smear collected Patient declined STI screening Patient desires conception and was advised to start taking prenatal vitamins Patient will be contacted with abnormal results See After Visit Summary for Counseling Recommendations

## 2018-06-24 NOTE — Progress Notes (Signed)
Presents for AEX/PAP. She wants to stop DEPO and try to get pregnant. Last DEPO was 04/29/2018.  Patient refused GC/Chlamydia Testing.

## 2018-07-01 LAB — CYTOLOGY - PAP
Diagnosis: UNDETERMINED — AB
HPV: NOT DETECTED

## 2018-07-02 ENCOUNTER — Other Ambulatory Visit: Payer: Self-pay

## 2018-07-02 ENCOUNTER — Encounter (HOSPITAL_COMMUNITY): Payer: Self-pay | Admitting: Emergency Medicine

## 2018-07-02 ENCOUNTER — Emergency Department (HOSPITAL_COMMUNITY)
Admission: EM | Admit: 2018-07-02 | Discharge: 2018-07-02 | Disposition: A | Discharge status: Home / Self Care | Payer: Self-pay | Attending: Emergency Medicine | Admitting: Emergency Medicine

## 2018-07-02 ENCOUNTER — Emergency Department (HOSPITAL_COMMUNITY): Payer: Self-pay

## 2018-07-02 DIAGNOSIS — Z79899 Other long term (current) drug therapy: Secondary | ICD-10-CM | POA: Insufficient documentation

## 2018-07-02 DIAGNOSIS — R69 Illness, unspecified: Secondary | ICD-10-CM

## 2018-07-02 DIAGNOSIS — J111 Influenza due to unidentified influenza virus with other respiratory manifestations: Secondary | ICD-10-CM | POA: Insufficient documentation

## 2018-07-02 DIAGNOSIS — R059 Cough, unspecified: Secondary | ICD-10-CM

## 2018-07-02 DIAGNOSIS — R05 Cough: Secondary | ICD-10-CM

## 2018-07-02 MED ORDER — AEROCHAMBER PLUS FLO-VU LARGE MISC
1.0000 | Freq: Once | Status: AC
  Administered 2018-07-02: 1
  Filled 2018-07-02: qty 1

## 2018-07-02 MED ORDER — ALBUTEROL SULFATE HFA 108 (90 BASE) MCG/ACT IN AERS
2.0000 | INHALATION_SPRAY | Freq: Once | RESPIRATORY_TRACT | Status: AC
  Administered 2018-07-02: 2 via RESPIRATORY_TRACT
  Filled 2018-07-02: qty 6.7

## 2018-07-02 MED ORDER — AEROCHAMBER PLUS FLO-VU LARGE MISC
Status: AC
  Administered 2018-07-02: 1
  Filled 2018-07-02: qty 1

## 2018-07-02 NOTE — ED Notes (Signed)
Patient verbalizes understanding of discharge instructions. Opportunity for questioning and answers were provided. Armband removed by staff, pt discharged from ED ambulatory.   

## 2018-07-02 NOTE — ED Triage Notes (Signed)
Pt reports sore throat since Friday, a cough since 2 weeks prior and had a fever earlier today. Pt is currently afebrile. Pt had diarrhea Sunday x2. Pt denies N/V. Pt reports CP when she coughs a lot. Pt reports that If she was too walk up a hill she would be SOB. Pt denies difficulty urinating or with bowel movements.

## 2018-07-02 NOTE — ED Provider Notes (Signed)
[facility] EMERGENCY DEPARTMENT Provider Note   CSN: [id number] Arrival date & time: 07/02/18  1738    History   Chief Complaint Chief Complaint  Patient presents with  . Influenza    HPI Helen White is a 23 y.o. female with no significant past medical history who presents today for evaluation of 2 weeks of cough.  She reports that she had a temperature earlier today of 100.2 at home.  She denies nausea or vomiting.  She reports that when she coughs frequently she gets some slight chest tightness and shortness of breath however that goes away when she is not coughing.  She denies any abdominal pain.  She does not have any known contact with anyone who has tested positive for corona virus.    She reports that she has seasonal allergies.  She has been taking Claritin for many years.  She reports postnasal drainage.         HPI  Past Medical History:  Diagnosis Date  . Medical history non-contributory     Patient Active Problem List   Diagnosis Date Noted  . Post-dates pregnancy 10/07/2017  . Supervision of normal first pregnancy, antepartum 05/11/2017    Past Surgical History:  Procedure Laterality Date  . NO PAST SURGERIES       OB History    Gravida  1   Para  1   Term  1   Preterm      AB      Living  1     SAB      TAB      Ectopic      Multiple  0   Live Births  1            Home Medications    Prior to Admission medications   Medication Sig Start Date End Date Taking? Authorizing Provider  acetaminophen (TYLENOL) 325 MG tablet Take 650 mg by mouth every 6 (six) hours as needed for headache.     [provider]  benzonatate (TESSALON) 200 MG capsule Take 1 capsule (200 mg total) by mouth 2 (two) times daily as needed for cough. 06/17/18   Eustace Moore, MD  cephALEXin (KEFLEX) 500 MG capsule Take 1 capsule (500 mg total) by mouth 2 (two) times daily. 06/17/18   Eustace Moore, MD  Docosahexaenoic  Acid (DHA COMPLETE PO) Take 1 tablet by mouth at bedtime.     [provider]  fluticasone (FLONASE) 50 MCG/ACT nasal spray Place 2 sprays into both nostrils daily. 06/17/18   Eustace Moore, MD  ipratropium (ATROVENT) 0.06 % nasal spray Place 2 sprays into both nostrils 4 (four) times daily. 03/17/18   Cathie Hoops, Amy V, PA-C  medroxyPROGESTERone (DEPO-PROVERA) 150 MG/ML injection INJECT 1 ML INTO THE MUSCLE EVERY 3 MONTHS 03/20/18   Constant, Peggy, MD  Prenatal Vit-Fe Fumarate-FA (PRENATAL MULTIVITAMIN) TABS tablet Take 1 tablet by mouth at bedtime.     [provider]  Prenatal Vit-Fe Phos-FA-Omega (VITAFOL GUMMIES) 3.33-0.333-34.8 MG CHEW Chew 2 tablets by mouth daily for 30 days. 06/24/18 07/24/18  Constant, Peggy, MD    Family History History reviewed. No pertinent family history.  Social History Social History   Tobacco Use  . Smoking status: Never Smoker  . Smokeless tobacco: Never Used  Substance Use Topics  . Alcohol use: No  . Drug use: No     Allergies   Penicillins   Review of Systems Review of Systems  Constitutional: Positive for chills and fever.  HENT: Positive for congestion, postnasal drip and rhinorrhea. Negative for sinus pressure, sinus pain, sore throat and trouble swallowing.   Respiratory: Positive for cough, chest tightness and shortness of breath.   Cardiovascular: Negative for palpitations and leg swelling.  Gastrointestinal: Negative for abdominal pain, diarrhea, nausea, rectal pain and vomiting.  Neurological: Negative for weakness and headaches.  All other systems reviewed and are negative.    Physical Exam Updated Vital Signs BP 106/70 (BP Location: Right Arm)   Pulse 98   Temp 98.4 F (36.9 C) (Oral)   Resp 16   Ht 5\' 4"  (1.626 m)   Wt 86.2 kg   LMP  (LMP Unknown)   SpO2 99%   BMI 32.61 kg/m   Physical Exam Constitutional:      General: She is not in acute distress.    Appearance: She is well-developed. She is not  diaphoretic.  HENT:     Head: Normocephalic and atraumatic.     Right Ear: Tympanic membrane, ear canal and external ear normal.     Left Ear: Tympanic membrane, ear canal and external ear normal.     Nose: Mucosal edema and rhinorrhea present.     Mouth/Throat:     Mouth: Mucous membranes are moist.     Pharynx: Uvula midline. No oropharyngeal exudate.     Tonsils: No tonsillar exudate.  Eyes:     General: No scleral icterus.    Conjunctiva/sclera: Conjunctivae normal.  Neck:     Musculoskeletal: Normal range of motion and neck supple.  Cardiovascular:     Rate and Rhythm: Normal rate and regular rhythm.     Pulses: Normal pulses.     Heart sounds: Normal heart sounds.  Pulmonary:     Effort: Pulmonary effort is normal. No respiratory distress.     Breath sounds: Normal breath sounds. No stridor. No wheezing or rhonchi.  Abdominal:     General: Abdomen is flat.     Tenderness: There is no abdominal tenderness.  Lymphadenopathy:     Cervical: No cervical adenopathy.  Skin:    General: Skin is warm and dry.  Neurological:     General: No focal deficit present.     Mental Status: She is alert.  Psychiatric:        Behavior: Behavior normal.      ED Treatments / Results  Labs (all labs ordered are listed, but only abnormal results are displayed) Labs Reviewed - No data to display  EKG None  Radiology Dg Chest [facility] 1 View  Result Date: 07/02/2018 CLINICAL DATA:  Cough and chest pain. Shortness of breath. EXAM: PORTABLE CHEST 1 VIEW COMPARISON:  None. FINDINGS: The cardiomediastinal contours are normal. The lungs are clear. Pulmonary vasculature is normal. No consolidation, pleural effusion, or pneumothorax. No acute osseous abnormalities are seen. IMPRESSION: Negative AP view of the chest. Electronically Signed   By: Narda Rutherford M.D.   On: 07/02/2018 20:01    Procedures Procedures (including critical care time)  Medications Ordered in ED Medications   albuterol (PROVENTIL HFA;VENTOLIN HFA) 108 (90 Base) MCG/ACT inhaler 2 puff (2 puffs Inhalation Given 07/02/18 1950)  AeroChamber Plus Flo-Vu Large MISC 1 each (1 each Other Given 07/02/18 1950)     Initial Impression / Assessment and Plan / ED Course  I have reviewed the triage vital signs and the nursing notes.  Pertinent labs & imaging results that were available during my care of the patient were reviewed  by me and considered in my medical decision making (see chart for details).       Patient presents today for evaluation of cough times approximately 2 weeks.  She was getting better until today when she started coughing more and developed a temperature of 100.2.  She has a history of seasonal allergies and has postnasal drainage which I suspect is contributing to her cough.  Given a worsening after having a cough concern for pneumonia.  CXR with out consolidation or other acute abnormalities.  She is given an albuterol inhaler while in the department.    Given that she has developed fever at home, with clear chest x-ray concern for influenza like illness or viral process.  She does not have any contact with anyone known to be positive for coronavirus, however she works in [employer] and has contact with many people.  Recommended that she quarantine herself at home.  She is not tachycardic or tachypneic, does not appear to need hospitalization at this time.    She does have injection birth control.  We discussed additional testing including labs, EKG, and PE testing.  She made the informed decision to decline at this time.    Return precautions were discussed with patient who states their understanding.  At the time of discharge patient denied any unaddressed complaints or concerns.  Patient is agreeable for discharge home.  She states her understanding of the instructions to self quarantine at home.   Final Clinical Impressions(s) / ED Diagnoses   Final diagnoses:  Cough   Influenza-like illness    ED Discharge Orders    None       Norman ClayHammond, Valeria Krisko W, PA-C 07/02/18 2242    Gwyneth SproutPlunkett, Whitney, MD 07/03/18 2059

## 2018-07-02 NOTE — Discharge Instructions (Addendum)
To use your inhaler please do 2 puffs every 4 hours as needed.  Your chest x-ray did not show evidence of pneumonia.  Please quarantine yourself at home.  I have given you the information that we are giving to patient's who have concern for coronavirus.  You need to remained quarantined for a minimum of 7 days.  You need to stay quarantined until you have not had any symptoms for 3 days.     Person Under Monitoring Name: Helen White [facility]  Location: [address] [place] [address]   Infection Prevention Recommendations for Individuals Confirmed to have, or Being Evaluated for, 2019 Novel Coronavirus (COVID-19) Infection Who Receive Care at Home  Individuals who are confirmed to have, or are being evaluated for, COVID-19 should follow the prevention steps below until a healthcare provider or local or state health department says they can return to normal activities.  Stay home except to get medical care You should restrict activities outside your home, except for getting medical care. Do not go to work, school, or public areas, and do not use public transportation or taxis.  Call ahead before visiting your doctor Before your medical appointment, call the healthcare provider and tell them that you have, or are being evaluated for, COVID-19 infection. This will help the healthcare providers office take steps to keep other people from getting infected. Ask your healthcare provider to call the local or state health department.  Monitor your symptoms Seek prompt medical attention if your illness is worsening (e.g., difficulty breathing). Before going to your medical appointment, call the healthcare provider and tell them that you have, or are being evaluated for, COVID-19 infection. Ask your healthcare provider to call the local or state health department.  Wear a facemask You should wear a facemask that covers your nose and mouth when you are in the same room with other  people and when you visit a healthcare provider. People who live with or visit you should also wear a facemask while they are in the same room with you.  Separate yourself from other people in your home As much as possible, you should stay in a different room from other people in your home. Also, you should use a separate bathroom, if available.  Avoid sharing household items You should not share dishes, drinking glasses, cups, eating utensils, towels, bedding, or other items with other people in your home. After using these items, you should wash them thoroughly with soap and water.  Cover your coughs and sneezes Cover your mouth and nose with a tissue when you cough or sneeze, or you can cough or sneeze into your sleeve. Throw used tissues in a lined trash can, and immediately wash your hands with soap and water for at least 20 seconds or use an alcohol-based hand rub.  Wash your [employer] your hands often and thoroughly with soap and water for at least 20 seconds. You can use an alcohol-based hand sanitizer if soap and water are not available and if your hands are not visibly dirty. Avoid touching your eyes, nose, and mouth with unwashed hands.   Prevention Steps for Caregivers and Household Members of Individuals Confirmed to have, or Being Evaluated for, COVID-19 Infection Being Cared for in the Home  If you live with, or provide care at home for, a person confirmed to have, or being evaluated for, COVID-19 infection please follow these guidelines to prevent infection:  Follow healthcare providers instructions Make sure that you understand and  can help the patient follow any healthcare provider instructions for all care.  Provide for the patients basic needs You should help the patient with basic needs in the home and provide support for getting groceries, prescriptions, and other personal needs.  Monitor the patients symptoms If they are getting sicker, call his or her  medical provider and tell them that the patient has, or is being evaluated for, COVID-19 infection. This will help the healthcare providers office take steps to keep other people from getting infected. Ask the healthcare provider to call the local or state health department.  Limit the number of people who have contact with the patient If possible, have only one caregiver for the patient. Other household members should stay in another home or place of residence. If this is not possible, they should stay in another room, or be separated from the patient as much as possible. Use a separate bathroom, if available. Restrict visitors who do not have an essential need to be in the home.  Keep older adults, very young children, and other sick people away from the patient Keep older adults, very young children, and those who have compromised immune systems or chronic health conditions away from the patient. This includes people with chronic heart, lung, or kidney conditions, diabetes, and cancer.  Ensure good ventilation Make sure that shared spaces in the home have good air flow, such as from an air conditioner or an opened window, weather permitting.  Wash your hands often Wash your hands often and thoroughly with soap and water for at least 20 seconds. You can use an alcohol based hand sanitizer if soap and water are not available and if your hands are not visibly dirty. Avoid touching your eyes, nose, and mouth with unwashed hands. Use disposable paper towels to dry your hands. If not available, use dedicated cloth towels and replace them when they become wet.  Wear a facemask and gloves Wear a disposable facemask at all times in the room and gloves when you touch or have contact with the patients blood, body fluids, and/or secretions or excretions, such as sweat, saliva, sputum, nasal mucus, vomit, urine, or feces.  Ensure the mask fits over your nose and mouth tightly, and do not touch it  during use. Throw out disposable facemasks and gloves after using them. Do not reuse. Wash your hands immediately after removing your facemask and gloves. If your personal clothing becomes contaminated, carefully remove clothing and launder. Wash your hands after handling contaminated clothing. Place all used disposable facemasks, gloves, and other waste in a lined container before disposing them with other household waste. Remove gloves and wash your hands immediately after handling these items.  Do not share dishes, glasses, or other household items with the patient Avoid sharing household items. You should not share dishes, drinking glasses, cups, eating utensils, towels, bedding, or other items with a patient who is confirmed to have, or being evaluated for, COVID-19 infection. After the person uses these items, you should wash them thoroughly with soap and water.  Wash laundry thoroughly Immediately remove and wash clothes or bedding that have blood, body fluids, and/or secretions or excretions, such as sweat, saliva, sputum, nasal mucus, vomit, urine, or feces, on them. Wear gloves when handling laundry from the patient. Read and follow directions on labels of laundry or clothing items and detergent. In general, wash and dry with the warmest temperatures recommended on the label.  Clean all areas the individual has used often Clean all  touchable surfaces, such as counters, tabletops, doorknobs, bathroom fixtures, toilets, phones, keyboards, tablets, and bedside tables, every day. Also, clean any surfaces that may have blood, body fluids, and/or secretions or excretions on them. Wear gloves when cleaning surfaces the patient has come in contact with. Use a diluted bleach solution (e.g., dilute bleach with 1 part bleach and 10 parts water) or a household disinfectant with a label that says EPA-registered for coronaviruses. To make a bleach solution at home, add 1 tablespoon of bleach to 1  quart (4 cups) of water. For a larger supply, add  cup of bleach to 1 gallon (16 cups) of water. Read labels of cleaning products and follow recommendations provided on product labels. Labels contain instructions for safe and effective use of the cleaning product including precautions you should take when applying the product, such as wearing gloves or eye protection and making sure you have good ventilation during use of the product. Remove gloves and wash hands immediately after cleaning.  Monitor yourself for signs and symptoms of illness Caregivers and household members are considered close contacts, should monitor their health, and will be asked to limit movement outside of the home to the extent possible. Follow the monitoring steps for close contacts listed on the symptom monitoring form.   ? If you have additional questions, contact your local health department or call the epidemiologist on call at [phone] (available 24/7). ? This guidance is subject to change. For the most up-to-date guidance from [facility], please refer to their website: [email or web address][email or web address]

## 2018-09-27 ENCOUNTER — Ambulatory Visit
Admission: EM | Admit: 2018-09-27 | Discharge: 2018-09-27 | Disposition: A | Discharge status: Home / Self Care | Payer: Medicaid Other | Attending: Family Medicine | Admitting: Family Medicine

## 2018-09-27 ENCOUNTER — Other Ambulatory Visit: Payer: Self-pay

## 2018-09-27 DIAGNOSIS — G43809 Other migraine, not intractable, without status migrainosus: Secondary | ICD-10-CM

## 2018-09-27 MED ORDER — KETOROLAC TROMETHAMINE 30 MG/ML IJ SOLN
30.0000 mg | Freq: Once | INTRAMUSCULAR | Status: AC
  Administered 2018-09-27: 30 mg via INTRAMUSCULAR

## 2018-09-27 MED ORDER — DEXAMETHASONE SODIUM PHOSPHATE 10 MG/ML IJ SOLN
10.0000 mg | Freq: Once | INTRAMUSCULAR | Status: AC
  Administered 2018-09-27: 10 mg via INTRAMUSCULAR

## 2018-09-27 NOTE — ED Triage Notes (Signed)
Pt c/o center chest pain radiating to both shoulders at different times with a headache and dizziness since 8am while standing at work

## 2018-09-27 NOTE — ED Provider Notes (Signed)
[place]    CSN: [id number] Arrival date & time: 09/27/18  1320     History   Chief Complaint Chief Complaint  Patient presents with  . Chest Pain    HPI Helen White is a 23 y.o. female.   Pt is a 23 year old female that presents with headache. This started this morning while she was at work. It has some what improved. She has not taken anything for the symptoms. She felt slightly dizzy. She also had some chest pain, central radiating into both shoulders. This has resolved. No SOB. Hx of migraines. No blurred vision, slurred speech. No N,V or photophobia. Some sinus congestion without cough, chest congestion or fever.   ROS per HPI       Past Medical History:  Diagnosis Date  . Medical history non-contributory     Patient Active Problem List   Diagnosis Date Noted  . Post-dates pregnancy 10/07/2017  . Supervision of normal first pregnancy, antepartum 05/11/2017    Past Surgical History:  Procedure Laterality Date  . NO PAST SURGERIES      OB History    Gravida  1   Para  1   Term  1   Preterm      AB      Living  1     SAB      TAB      Ectopic      Multiple  0   Live Births  1            Home Medications    Prior to Admission medications   Medication Sig Start Date End Date Taking? Authorizing Provider  acetaminophen (TYLENOL) 325 MG tablet Take 650 mg by mouth every 6 (six) hours as needed for headache.     [provider]  Docosahexaenoic Acid (DHA COMPLETE PO) Take 1 tablet by mouth at bedtime.     [provider]  fluticasone (FLONASE) 50 MCG/ACT nasal spray Place 2 sprays into both nostrils daily. 06/17/18   Raylene Everts, MD  ipratropium (ATROVENT) 0.06 % nasal spray Place 2 sprays into both nostrils 4 (four) times daily. 03/17/18   Ok Edwards, PA-C  Prenatal Vit-Fe Fumarate-FA (PRENATAL MULTIVITAMIN) TABS tablet Take 1 tablet by mouth at bedtime.     [provider]    Family  History History reviewed. No pertinent family history.  Social History Social History   Tobacco Use  . Smoking status: Never Smoker  . Smokeless tobacco: Never Used  Substance Use Topics  . Alcohol use: No  . Drug use: No     Allergies   Penicillins   Review of Systems Review of Systems   Physical Exam Triage Vital Signs ED Triage Vitals  Enc Vitals Group     BP 09/27/18 1328 111/74     Pulse Rate 09/27/18 1328 91     Resp 09/27/18 1328 20     Temp 09/27/18 1328 98.5 F (36.9 C)     Temp Source 09/27/18 1328 Oral     SpO2 09/27/18 1328 99 %     Weight --      Height --      Head Circumference --      Peak Flow --      Pain Score 09/27/18 1329 5     Pain Loc --      Pain Edu? --      Excl. in [place]? --    No data  found.  Updated Vital Signs BP 111/74 (BP Location: Left Arm)   Pulse 91   Temp 98.5 F (36.9 C) (Oral)   Resp 20   SpO2 99%   Visual Acuity Right Eye Distance:   Left Eye Distance:   Bilateral Distance:    Right Eye Near:   Left Eye Near:    Bilateral Near:     Physical Exam Vitals signs and nursing note reviewed.  Constitutional:      General: She is not in acute distress.    Appearance: She is well-developed. She is not ill-appearing, toxic-appearing or diaphoretic.  HENT:     Head: Normocephalic and atraumatic.  Eyes:     Extraocular Movements: Extraocular movements intact.     Pupils: Pupils are equal, round, and reactive to light.  Neck:     Musculoskeletal: Normal range of motion.  Cardiovascular:     Rate and Rhythm: Normal rate and regular rhythm.     Heart sounds: Normal heart sounds.  Pulmonary:     Effort: Pulmonary effort is normal.     Breath sounds: Normal breath sounds.  Chest:     Chest wall: No tenderness.  Musculoskeletal: Normal range of motion.     Right lower leg: She exhibits no tenderness. No edema.     Left lower leg: She exhibits no tenderness. No edema.  Skin:    General: Skin is warm and dry.      Findings: No rash.  Neurological:     General: No focal deficit present.     Mental Status: She is alert.     Cranial Nerves: No cranial nerve deficit.     Motor: No weakness.  Psychiatric:        Mood and Affect: Mood normal.      UC Treatments / Results  Labs (all labs ordered are listed, but only abnormal results are displayed) Labs Reviewed - No data to display  EKG None  Radiology No results found.  Procedures Procedures (including critical care time)  Medications Ordered in UC Medications  dexamethasone (DECADRON) injection 10 mg (10 mg Intramuscular Given 09/27/18 1410)  ketorolac (TORADOL) 30 MG/ML injection 30 mg (30 mg Intramuscular Given 09/27/18 1411)    Initial Impression / Assessment and Plan / UC Course  I have reviewed the triage vital signs and the nursing notes.  Pertinent labs & imaging results that were available during my care of the patient were reviewed by me and considered in my medical decision making (see chart for details).     Nothing concerning on exam.  VSS and she is non toxic or ill appearing.  Most of her symptoms have improved.  Denies concern for pregnancy.  No focal neuro deficits.  Treating for migraine vs sinus headache.  Toradol and decadron given here Recommended rest and hydration Follow up as needed for continued or worsening symptoms  Final Clinical Impressions(s) / UC Diagnoses   Final diagnoses:  Other migraine without status migrainosus, not intractable     Discharge Instructions     Treating you for migraine vs sinus headache.  Recommend taking zyrtec and Flonase daily if this is allergy related.  Go home and rest and stay hydrated.  Follow up with neurology as needed.      ED Prescriptions    None     Controlled Substance Prescriptions [place] Controlled Substance Registry consulted? Not Applicable   Janace ArisBast, Jennessy Sandridge A, NP 09/30/18 [phone]

## 2018-09-27 NOTE — Discharge Instructions (Signed)
Treating you for migraine vs sinus headache.  Recommend taking zyrtec and Flonase daily if this is allergy related.  Go home and rest and stay hydrated.  Follow up with neurology as needed.

## 2018-11-18 ENCOUNTER — Ambulatory Visit (HOSPITAL_COMMUNITY)
Admission: EM | Admit: 2018-11-18 | Discharge: 2018-11-18 | Disposition: A | Discharge status: Home / Self Care | Payer: Self-pay | Attending: Family Medicine | Admitting: Family Medicine

## 2018-11-18 ENCOUNTER — Other Ambulatory Visit: Payer: Self-pay

## 2018-11-18 ENCOUNTER — Encounter (HOSPITAL_COMMUNITY): Payer: Self-pay

## 2018-11-18 ENCOUNTER — Ambulatory Visit (INDEPENDENT_AMBULATORY_CARE_PROVIDER_SITE_OTHER): Payer: Self-pay

## 2018-11-18 DIAGNOSIS — S63502A Unspecified sprain of left wrist, initial encounter: Secondary | ICD-10-CM

## 2018-11-18 MED ORDER — NAPROXEN 500 MG PO TABS: 500.0000 mg | ORAL_TABLET | Freq: Two times a day (BID) | ORAL | 0 refills | Status: DC

## 2018-11-18 NOTE — ED Triage Notes (Signed)
Pt presents with left wrist pain after having a fall this weekend and trying to catch her fall with her left hand.

## 2018-11-18 NOTE — ED Provider Notes (Signed)
[facility]    CSN: [id number] Arrival date & time: 11/18/18  [id number]      History   Chief Complaint Chief Complaint  Patient presents with  . Wrist Pain    HPI Helen White is a 23 y.o. female.   Patient is a 23 year old female presents today with left wrist pain, swelling.  This started after a fall with outstretched hand this past weekend.  Symptoms have been constant.  She has been taking ibuprofen with some relief.  She was at work today trying to lift heavy items and was having issues due to the wrist pain.  They sent her here to have her evaluated.  Denies any numbness, tingling to the hand or wrist.   ROS per HPI      Past Medical History:  Diagnosis Date  . Medical history non-contributory     Patient Active Problem List   Diagnosis Date Noted  . Post-dates pregnancy 10/07/2017  . Supervision of normal first pregnancy, antepartum 05/11/2017    Past Surgical History:  Procedure Laterality Date  . NO PAST SURGERIES      OB History    Gravida  1   Para  1   Term  1   Preterm      AB      Living  1     SAB      TAB      Ectopic      Multiple  0   Live Births  1            Home Medications    Prior to Admission medications   Medication Sig Start Date End Date Taking? Authorizing Provider  acetaminophen (TYLENOL) 325 MG tablet Take 650 mg by mouth every 6 (six) hours as needed for headache.     [provider]  Docosahexaenoic Acid (DHA COMPLETE PO) Take 1 tablet by mouth at bedtime.     [provider]  fluticasone (FLONASE) 50 MCG/ACT nasal spray Place 2 sprays into both nostrils daily. 06/17/18   Eustace MooreNelson, Yvonne Sue, MD  ipratropium (ATROVENT) 0.06 % nasal spray Place 2 sprays into both nostrils 4 (four) times daily. 03/17/18   Cathie HoopsYu, Amy V, PA-C  naproxen (NAPROSYN) 500 MG tablet Take 1 tablet (500 mg total) by mouth 2 (two) times daily. 11/18/18   Janace ArisBast, Hermen Mario A, NP  Prenatal Vit-Fe Fumarate-FA (PRENATAL  MULTIVITAMIN) TABS tablet Take 1 tablet by mouth at bedtime.     [provider]    Family History History reviewed. No pertinent family history.  Social History Social History   Tobacco Use  . Smoking status: Never Smoker  . Smokeless tobacco: Never Used  Substance Use Topics  . Alcohol use: No  . Drug use: No     Allergies   Penicillins   Review of Systems Review of Systems   Physical Exam Triage Vital Signs ED Triage Vitals  Enc Vitals Group     BP 11/18/18 0907 107/71     Pulse Rate 11/18/18 0907 79     Resp 11/18/18 0907 18     Temp 11/18/18 0907 98.1 F (36.7 C)     Temp Source 11/18/18 0907 Oral     SpO2 11/18/18 0907 98 %     Weight --      Height --      Head Circumference --      Peak Flow --      Pain Score 11/18/18 0908 9  Pain Loc --      Pain Edu? --      Excl. in [place]? --    No data found.  Updated Vital Signs BP 107/71 (BP Location: Right Arm)   Pulse 79   Temp 98.1 F (36.7 C) (Oral)   Resp 18   LMP 11/03/2018   SpO2 98%   Visual Acuity Right Eye Distance:   Left Eye Distance:   Bilateral Distance:    Right Eye Near:   Left Eye Near:    Bilateral Near:     Physical Exam Vitals signs and nursing note reviewed.  Constitutional:      Appearance: Normal appearance.  HENT:     Head: Normocephalic and atraumatic.     Nose: Nose normal.  Eyes:     Conjunctiva/sclera: Conjunctivae normal.  Neck:     Musculoskeletal: Normal range of motion.  Pulmonary:     Effort: Pulmonary effort is normal.  Musculoskeletal:        General: Swelling and tenderness present.     Comments: Swelling and tenderness to the left wrist. Pain with flexion and extension otherwise good ROM Good cap refill.  Pulse strong.  No obvious deformities or bruising. Temperature normal  Skin:    General: Skin is warm and dry.     Capillary Refill: Capillary refill takes less than 2 seconds.  Neurological:     Mental Status: She is alert.   Psychiatric:        Mood and Affect: Mood normal.      UC Treatments / Results  Labs (all labs ordered are listed, but only abnormal results are displayed) Labs Reviewed - No data to display  EKG   Radiology Dg Wrist Complete Left  Result Date: 11/18/2018 CLINICAL DATA:  [place] 2 days ago.  Pain and swelling. EXAM: LEFT WRIST - COMPLETE 3+ VIEW COMPARISON:  None. FINDINGS: No evidence of fracture or dislocation. 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 IMPRESSION: No acute or traumatic finding.  See above. Electronically Signed   By: Nelson Chimes M.D.   On: 11/18/2018 09:51    Procedures Procedures (including critical care time)  Medications Ordered in UC Medications - No data to display  Initial Impression / Assessment and Plan / UC Course  I have reviewed the triage vital signs and the nursing notes.  Pertinent labs & imaging results that were available during my care of the patient were reviewed by me and considered in my medical decision making (see chart for details).     X-ray without any acute abnormalities. Most likely wrist sprain.  Will place and wrist splint and give naproxen for pain inflammation. Recommended rest, ice, elevate and follow-up as needed Work note given Final Clinical Impressions(s) / UC Diagnoses   Final diagnoses:  Sprain of left wrist, initial encounter     Discharge Instructions     You can take the naproxen twice a day as needed for pain inflammation.  Make sure you take this with food. Wrist splint applied here in clinic to wear.  Make sure you are wearing this and icing the area Work note given to return back on Friday.   ED Prescriptions    Medication Sig Dispense Auth. Provider   naproxen (NAPROSYN) 500 MG tablet Take 1 tablet (500 mg total) by mouth 2 (two) times daily. 30 tablet Loura Halt A, NP     Controlled Substance Prescriptions  [place]  Controlled Substance Registry consulted? No   Janace ArisBast, Byanca Kasper A, NP 11/18/18 1011

## 2018-11-18 NOTE — Discharge Instructions (Signed)
You can take the naproxen twice a day as needed for pain inflammation.  Make sure you take this with food. Wrist splint applied here in clinic to wear.  Make sure you are wearing this and icing the area Work note given to return back on Friday.

## 2019-01-03 ENCOUNTER — Other Ambulatory Visit: Payer: Self-pay

## 2019-01-03 ENCOUNTER — Ambulatory Visit
Admission: EM | Admit: 2019-01-03 | Discharge: 2019-01-03 | Disposition: A | Discharge status: Home / Self Care | Payer: Medicaid Other | Attending: Emergency Medicine | Admitting: Emergency Medicine

## 2019-01-03 DIAGNOSIS — R0981 Nasal congestion: Secondary | ICD-10-CM

## 2019-01-03 DIAGNOSIS — R05 Cough: Secondary | ICD-10-CM

## 2019-01-03 DIAGNOSIS — Z20822 Contact with and (suspected) exposure to covid-19: Secondary | ICD-10-CM

## 2019-01-03 DIAGNOSIS — Z20828 Contact with and (suspected) exposure to other viral communicable diseases: Secondary | ICD-10-CM

## 2019-01-03 MED ORDER — ALBUTEROL SULFATE HFA 108 (90 BASE) MCG/ACT IN AERS: 2.0000 | INHALATION_SPRAY | RESPIRATORY_TRACT | 0 refills | Status: DC | PRN

## 2019-01-03 NOTE — ED Provider Notes (Signed)
[facility]    CSN: [id number] Arrival date & time: 01/03/19  1636      History   Chief Complaint Chief Complaint  Patient presents with  . Facial Pain  . sinus sx    HPI Helen White is a 23 y.o. female presenting for 3-day course of nasal congestion, sore throat, postnasal drip, myalgias, sneezing, fever.  T-max 102F: None today without use of antipyretic, NSAID.  Patient denies known contact with COVID positive persons, though does work in a public setting at a [employer].  Patient states that "it felt a lot like the flu at first ".  Reports improvement in severity of myalgias.  No nausea, abdominal pain, diarrhea.  Has been taking over-the-counter analgesia with adequate relief of symptoms.  Does report occasional dry, nonproductive, non-hemoptic cough without chest tightness or shortness of breath.  Endorses previous use of albuterol inhaler, though current MDI only has 20 puffs left.  Past Medical History:  Diagnosis Date  . Medical history non-contributory     Patient Active Problem List   Diagnosis Date Noted  . Post-dates pregnancy 10/07/2017  . Supervision of normal first pregnancy, antepartum 05/11/2017    Past Surgical History:  Procedure Laterality Date  . NO PAST SURGERIES      OB History    Gravida  1   Para  1   Term  1   Preterm      AB      Living  1     SAB      TAB      Ectopic      Multiple  0   Live Births  1            Home Medications    Prior to Admission medications   Medication Sig Start Date End Date Taking? Authorizing Provider  acetaminophen (TYLENOL) 325 MG tablet Take 650 mg by mouth every 6 (six) hours as needed for headache.     [provider]  albuterol (VENTOLIN HFA) 108 (90 Base) MCG/ACT inhaler Inhale 2 puffs into the lungs every 4 (four) hours as needed for wheezing or shortness of breath. 01/03/19   Hall-Potvin, [place], PA-C  Docosahexaenoic Acid (DHA COMPLETE PO)  Take 1 tablet by mouth at bedtime.     [provider]  fluticasone (FLONASE) 50 MCG/ACT nasal spray Place 2 sprays into both nostrils daily. 06/17/18   Raylene Everts, MD  Prenatal Vit-Fe Fumarate-FA (PRENATAL MULTIVITAMIN) TABS tablet Take 1 tablet by mouth at bedtime.     [provider]  ipratropium (ATROVENT) 0.06 % nasal spray Place 2 sprays into both nostrils 4 (four) times daily. 03/17/18 01/03/19  Ok Edwards, PA-C    Family History Family History  Problem Relation Age of Onset  . Healthy Mother   . Healthy Father     Social History Social History   Tobacco Use  . Smoking status: Never Smoker  . Smokeless tobacco: Never Used  Substance Use Topics  . Alcohol use: No  . Drug use: No     Allergies   Penicillins   Review of Systems Review of Systems  Constitutional: Positive for appetite change. Negative for activity change, fatigue and fever.  HENT: Positive for congestion, postnasal drip, sneezing and sore throat. Negative for dental problem, ear pain, facial swelling, hearing loss, sinus pain, trouble swallowing and voice change.   Eyes: Negative for photophobia, pain, redness and visual disturbance.  Respiratory: Positive for cough.  Negative for chest tightness, shortness of breath, wheezing and stridor.   Cardiovascular: Negative for chest pain, palpitations and leg swelling.  Gastrointestinal: Negative for abdominal pain, diarrhea, nausea and vomiting.  Musculoskeletal: Negative for arthralgias and myalgias.  Skin: Negative for rash and wound.  Neurological: Negative for dizziness, syncope and headaches.     Physical Exam Triage Vital Signs ED Triage Vitals  Enc Vitals Group     BP 01/03/19 1645 103/71     Pulse Rate 01/03/19 1645 89     Resp 01/03/19 1645 16     Temp 01/03/19 1645 98.4 F (36.9 C)     Temp Source 01/03/19 1645 Oral     SpO2 01/03/19 1645 97 %     Weight --      Height --      Head Circumference --      Peak Flow --       Pain Score 01/03/19 1647 0     Pain Loc --      Pain Edu? --      Excl. in [place]? --    No data found.  Updated Vital Signs BP 103/71 (BP Location: Left Arm)   Pulse 89   Temp 98.4 F (36.9 C) (Oral)   Resp 16   LMP 12/14/2018   SpO2 97%   Visual Acuity Right Eye Distance:   Left Eye Distance:   Bilateral Distance:    Right Eye Near:   Left Eye Near:    Bilateral Near:     Physical Exam Constitutional:      General: She is not in acute distress.    Appearance: She is obese. She is not ill-appearing.  HENT:     Head: Normocephalic and atraumatic.     Jaw: There is normal jaw occlusion. No tenderness or pain on movement.     Right Ear: Hearing, tympanic membrane, ear canal and external ear normal. No tenderness. No mastoid tenderness.     Left Ear: Hearing, tympanic membrane, ear canal and external ear normal. No tenderness. No mastoid tenderness.     Nose: Congestion present. No nasal deformity, septal deviation or nasal tenderness.     Right Turbinates: Not swollen or pale.     Left Turbinates: Not swollen or pale.     Right Sinus: No maxillary sinus tenderness or frontal sinus tenderness.     Left Sinus: No maxillary sinus tenderness or frontal sinus tenderness.     Comments: Bilateral mucosal injection without significant turbinate edema.  Negative sinus tenderness bilaterally    Mouth/Throat:     Lips: Pink. No lesions.     Mouth: Mucous membranes are moist. No injury.     Pharynx: Oropharynx is clear. Uvula midline. No oropharyngeal exudate, posterior oropharyngeal erythema or uvula swelling.     Comments: no tonsillar exudate or hypertrophy Eyes:     General: No scleral icterus.    Extraocular Movements: Extraocular movements intact.     Conjunctiva/sclera: Conjunctivae normal.     Pupils: Pupils are equal, round, and reactive to light.  Neck:     Musculoskeletal: Normal range of motion and neck supple. No muscular tenderness.  Cardiovascular:     Rate and  Rhythm: Normal rate and regular rhythm.     Heart sounds: No murmur. No gallop.   Pulmonary:     Effort: Pulmonary effort is normal. No respiratory distress.     Breath sounds: No stridor. No wheezing, rhonchi or rales.  Abdominal:  General: Bowel sounds are normal.     Tenderness: There is no abdominal tenderness.  Lymphadenopathy:     Cervical: No cervical adenopathy.  Skin:    General: Skin is warm.     Capillary Refill: Capillary refill takes less than 2 seconds.     Coloration: Skin is not jaundiced or pale.  Neurological:     General: No focal deficit present.     Mental Status: She is alert and oriented to person, place, and time.      UC Treatments / Results  Labs (all labs ordered are listed, but only abnormal results are displayed) Labs Reviewed  NOVEL CORONAVIRUS, NAA    EKG   Radiology No results found.  Procedures Procedures (including critical care time)  Medications Ordered in UC Medications - No data to display  Initial Impression / Assessment and Plan / UC Course  I have reviewed the triage vital signs and the nursing notes.  Pertinent labs & imaging results that were available during my care of the patient were reviewed by me and considered in my medical decision making (see chart for details).     1.  Nasal congestion Patient to increase compliance with antihistamine, intranasal steroid, and increase water intake.  No cardiopulmonary abnormalities on exam today: refill for inhaler sent.  COVID test pending: Work note provided.  Return precautions discussed, patient verbalized understanding and is agreeable to plan.  Final Clinical Impressions(s) / UC Diagnoses   Final diagnoses:  Nasal congestion  Suspected Covid-19 Virus Infection     Discharge Instructions     Your COVID test is pending: Is important you quarantine at home until your results are back. You may take OTC Tylenol for fever and myalgias.  It is important to drink plenty  of water throughout the day to stay hydrated. If you test positive and later develop severe fever, cough, or shortness of breath, it is recommended that you go to the ER for further evaluation.    ED Prescriptions    Medication Sig Dispense Auth. Provider   albuterol (VENTOLIN HFA) 108 (90 Base) MCG/ACT inhaler Inhale 2 puffs into the lungs every 4 (four) hours as needed for wheezing or shortness of breath. 18 g Hall-Potvin, [place], PA-C     PDMP not reviewed this encounter.   Odette Fraction [place], [place] 01/03/19 1819

## 2019-01-03 NOTE — ED Notes (Signed)
Patient able to ambulate independently  

## 2019-01-03 NOTE — ED Triage Notes (Signed)
Pt presents to UC w/ c/o congestion, sore throat, muscle soreness, sneezing, fever x3 days. Denies having covid exposure.

## 2019-01-03 NOTE — Discharge Instructions (Signed)
Your COVID test is pending: Is important you quarantine at home until your results are back. °You may take OTC Tylenol for fever and myalgias.  It is important to drink plenty of water throughout the day to stay hydrated. °If you test positive and later develop severe fever, cough, or shortness of breath, it is recommended that you go to the ER for further evaluation. °

## 2019-01-04 LAB — NOVEL CORONAVIRUS, NAA: SARS-CoV-2, NAA: NOT DETECTED

## 2019-04-07 ENCOUNTER — Ambulatory Visit
Admission: EM | Admit: 2019-04-07 | Discharge: 2019-04-07 | Disposition: A | Discharge status: Home / Self Care | Payer: Medicaid Other | Attending: Emergency Medicine | Admitting: Emergency Medicine

## 2019-04-07 ENCOUNTER — Other Ambulatory Visit: Payer: Self-pay

## 2019-04-07 ENCOUNTER — Encounter: Payer: Self-pay | Admitting: Emergency Medicine

## 2019-04-07 DIAGNOSIS — R05 Cough: Secondary | ICD-10-CM

## 2019-04-07 DIAGNOSIS — Z20822 Contact with and (suspected) exposure to covid-19: Secondary | ICD-10-CM

## 2019-04-07 DIAGNOSIS — R059 Cough, unspecified: Secondary | ICD-10-CM

## 2019-04-07 DIAGNOSIS — Z20828 Contact with and (suspected) exposure to other viral communicable diseases: Secondary | ICD-10-CM

## 2019-04-07 MED ORDER — PREDNISONE 20 MG PO TABS: 20.0000 mg | ORAL_TABLET | Freq: Every day | ORAL | 0 refills | Status: AC

## 2019-04-07 MED ORDER — ALBUTEROL SULFATE HFA 108 (90 BASE) MCG/ACT IN AERS: 2.0000 | INHALATION_SPRAY | RESPIRATORY_TRACT | 0 refills | Status: DC | PRN

## 2019-04-07 NOTE — Discharge Instructions (Signed)
Your COVID test is pending - it is important to quarantine / isolate at home until your results are back. °If you test positive and would like further evaluation for persistent or worsening symptoms, you may schedule an E-visit or virtual (video) visit throughout the [facility] MyChart app or website. ° °PLEASE NOTE: If you develop severe chest pain or shortness of breath please go to the ER or call 9-1-1 for further evaluation --> DO NOT schedule electronic or virtual visits for this. °Please call our office for further guidance / recommendations as needed. °

## 2019-04-07 NOTE — ED Provider Notes (Signed)
[facility]    CSN: [id number] Arrival date & time: 04/07/19  1348      History   Chief Complaint Chief Complaint  Patient presents with  . COVID Exposure    HPI Helen White is a 23 y.o. female   Presenting for Covid testing: Exposure: husband who tested positive on Wednesday Date of exposure: cohabitat/chronic Any fever, symptoms since exposure: yes - dry cough, nasal congestion (worse in AM).  Tried dayquil w/some relief.    Past Medical History:  Diagnosis Date  . Medical history non-contributory     Patient Active Problem List   Diagnosis Date Noted  . Post-dates pregnancy 10/07/2017  . Supervision of normal first pregnancy, antepartum 05/11/2017    Past Surgical History:  Procedure Laterality Date  . NO PAST SURGERIES      OB History    Gravida  1   Para  1   Term  1   Preterm      AB      Living  1     SAB      TAB      Ectopic      Multiple  0   Live Births  1            Home Medications    Prior to Admission medications   Medication Sig Start Date End Date Taking? Authorizing Provider  acetaminophen (TYLENOL) 325 MG tablet Take 650 mg by mouth every 6 (six) hours as needed for headache.     [provider]  albuterol (VENTOLIN HFA) 108 (90 Base) MCG/ACT inhaler Inhale 2 puffs into the lungs every 4 (four) hours as needed for wheezing or shortness of breath. 04/07/19   Hall-Potvin, [place], PA-C  Docosahexaenoic Acid (DHA COMPLETE PO) Take 1 tablet by mouth at bedtime.     [provider]  fluticasone (FLONASE) 50 MCG/ACT nasal spray Place 2 sprays into both nostrils daily. 06/17/18   Eustace Moore, MD  predniSONE (DELTASONE) 20 MG tablet Take 1 tablet (20 mg total) by mouth daily for 7 days. 04/07/19 04/14/19  Hall-Potvin, [place], PA-C  Prenatal Vit-Fe Fumarate-FA (PRENATAL MULTIVITAMIN) TABS tablet Take 1 tablet by mouth at bedtime.     [provider]  ipratropium (ATROVENT)  0.06 % nasal spray Place 2 sprays into both nostrils 4 (four) times daily. 03/17/18 01/03/19  Belinda Fisher, PA-C    Family History Family History  Problem Relation Age of Onset  . Healthy Mother   . Healthy Father     Social History Social History   Tobacco Use  . Smoking status: Never Smoker  . Smokeless tobacco: Never Used  Substance Use Topics  . Alcohol use: No  . Drug use: No     Allergies   Penicillins   Review of Systems Review of Systems  Constitutional: Negative for activity change, appetite change, fatigue and fever.  HENT: Positive for congestion, postnasal drip and rhinorrhea. Negative for dental problem, ear pain, facial swelling, hearing loss, sinus pain, sore throat, trouble swallowing and voice change.   Eyes: Negative for photophobia, pain and visual disturbance.  Respiratory: Positive for cough. Negative for chest tightness, shortness of breath, wheezing and stridor.   Cardiovascular: Negative for chest pain and palpitations.  Gastrointestinal: Negative for diarrhea and vomiting.  Musculoskeletal: Negative for arthralgias and myalgias.  Neurological: Negative for dizziness and headaches.     Physical Exam Triage Vital Signs ED Triage Vitals  Enc Vitals Group  BP 04/07/19 1452 104/65     Pulse Rate 04/07/19 1452 83     Resp 04/07/19 1452 16     Temp 04/07/19 1452 98 F (36.7 C)     Temp Source 04/07/19 1452 Temporal     SpO2 04/07/19 1452 97 %     Weight --      Height --      Head Circumference --      Peak Flow --      Pain Score 04/07/19 1453 0     Pain Loc --      Pain Edu? --      Excl. in [place]? --    No data found.  Updated Vital Signs BP 104/65 (BP Location: Left Arm)   Pulse 83   Temp 98 F (36.7 C) (Temporal)   Resp 16   SpO2 97%   Visual Acuity Right Eye Distance:   Left Eye Distance:   Bilateral Distance:    Right Eye Near:   Left Eye Near:    Bilateral Near:     Physical Exam Constitutional:      General: She is  not in acute distress.    Appearance: She is obese. She is not ill-appearing.  HENT:     Head: Normocephalic and atraumatic.     Mouth/Throat:     Mouth: Mucous membranes are moist.     Pharynx: Oropharynx is clear.  Eyes:     General: No scleral icterus.    Pupils: Pupils are equal, round, and reactive to light.  Cardiovascular:     Rate and Rhythm: Normal rate.  Pulmonary:     Effort: Pulmonary effort is normal. No respiratory distress.     Breath sounds: No stridor. Rhonchi present. No wheezing or rales.     Comments: Few, scattered rhonchi which improved with cough Musculoskeletal:     Cervical back: Neck supple. No tenderness.  Lymphadenopathy:     Cervical: No cervical adenopathy.  Skin:    Capillary Refill: Capillary refill takes less than 2 seconds.     Coloration: Skin is not jaundiced or pale.  Neurological:     Mental Status: She is alert and oriented to person, place, and time.      UC Treatments / Results  Labs (all labs ordered are listed, but only abnormal results are displayed) Labs Reviewed  NOVEL CORONAVIRUS, NAA    EKG   Radiology No results found.  Procedures Procedures (including critical care time)  Medications Ordered in UC Medications - No data to display  Initial Impression / Assessment and Plan / UC Course  I have reviewed the triage vital signs and the nursing notes.  Pertinent labs & imaging results that were available during my care of the patient were reviewed by me and considered in my medical decision making (see chart for details).     Patient afebrile, nontoxic, with SpO2 97%.  Covid PCR pending.  Patient to quarantine until results are back.  We will continue supportive management and add albuterol, prednisone as adjuvant.  Return precautions discussed, patient verbalized understanding and is agreeable to plan. Final Clinical Impressions(s) / UC Diagnoses   Final diagnoses:  Cough  Exposure to COVID-19 virus      Discharge Instructions     Your COVID test is pending - it is important to quarantine / isolate at home until your results are back. If you test positive and would like further evaluation for persistent or worsening symptoms, you may schedule  an E-visit or virtual (video) visit throughout the [facility] MyChart app or website.  PLEASE NOTE: If you develop severe chest pain or shortness of breath please go to the ER or call 9-1-1 for further evaluation --> DO NOT schedule electronic or virtual visits for this. Please call our office for further guidance / recommendations as needed.    ED Prescriptions    Medication Sig Dispense Auth. Provider   predniSONE (DELTASONE) 20 MG tablet Take 1 tablet (20 mg total) by mouth daily for 7 days. 7 tablet Hall-Potvin, [place], PA-C   albuterol (VENTOLIN HFA) 108 (90 Base) MCG/ACT inhaler Inhale 2 puffs into the lungs every 4 (four) hours as needed for wheezing or shortness of breath. 18 g Hall-Potvin, [place], PA-C     PDMP not reviewed this encounter.   Odette FractionHall-Potvin, [place], [place] 04/08/19 [phone]

## 2019-04-07 NOTE — ED Notes (Signed)
Patient able to ambulate independently  

## 2019-04-07 NOTE — ED Triage Notes (Signed)
Pt presents to [facility] for assessment of cough x 1 week.  Husband tested positive for COVID Wednesday.

## 2019-04-09 LAB — NOVEL CORONAVIRUS, NAA: SARS-CoV-2, NAA: NOT DETECTED

## 2019-04-11 NOTE — L&D Delivery Note (Signed)
Delivery Note  SVD viable female Apgars 9,9 over 2nd degree ML lac. Nuchal x 1 reduced on perineum.   Placenta delivered spontaneously intact with 3VC. Repair with 2-0 Chromic with good support and hemostasis noted.  R/V exam confirms.  PH art was not sent.   Mother and baby to couplet care and are doing well.  EBL 150cc  Candice Camp, MD

## 2019-04-22 ENCOUNTER — Ambulatory Visit: Payer: BC Managed Care – PPO | Admitting: Obstetrics and Gynecology

## 2019-04-22 ENCOUNTER — Encounter: Payer: Self-pay | Admitting: Obstetrics and Gynecology

## 2019-04-22 ENCOUNTER — Other Ambulatory Visit: Payer: Self-pay

## 2019-04-22 VITALS — BP 115/76 | HR 88 | Wt 195.0 lb

## 2019-04-22 DIAGNOSIS — R1032 Left lower quadrant pain: Secondary | ICD-10-CM

## 2019-04-22 DIAGNOSIS — G8929 Other chronic pain: Secondary | ICD-10-CM | POA: Diagnosis not present

## 2019-04-22 NOTE — Progress Notes (Signed)
24 yo P1 with BMI 33 and LMP 04/14/19 here for the evaluation of chronic left lower quadrant pain. Patient reports onset of the pain approximately a year ago since the discontinuation of depo-provera. She describes the pain as a dull pain present constantly. Nothing seems to make it worst and reports that a heating pad eases the pain but it never disappears. She denies dyspareunia. She denies constipation or urinary symptoms. She states having a regular 5-day period starting in May 2020 and skipped a period in December. She is sexually active and seeking a pregnancy  Past Medical History:  Diagnosis Date  . Medical history non-contributory    Past Surgical History:  Procedure Laterality Date  . NO PAST SURGERIES     Family History  Problem Relation Age of Onset  . Healthy Mother   . Healthy Father    Social History   Tobacco Use  . Smoking status: Never Smoker  . Smokeless tobacco: Never Used  Substance Use Topics  . Alcohol use: No  . Drug use: No   ROS See pertinent in HPI Blood pressure 115/76, pulse 88, weight 195 lb (88.5 kg), last menstrual period 04/14/2019, unknown if currently breastfeeding. GENERAL: Well-developed, well-nourished female in no acute distress.  ABDOMEN: Soft, nontender, nondistended.  PELVIC: Normal external female genitalia. Vagina is pink and rugated.  Normal discharge. Normal appearing cervix. Uterus is normal in size. No adnexal mass or tenderness. EXTREMITIES: No cyanosis, clubbing, or edema, 2+ distal pulses.  A/P 24 yo P1 with chronic left lower quadrant pain - Pelvic ultrasound ordered - Patient will be contacted with results - Continue prenatal vitamins - RTC prn

## 2019-04-29 ENCOUNTER — Other Ambulatory Visit: Payer: Self-pay

## 2019-04-29 ENCOUNTER — Ambulatory Visit (HOSPITAL_COMMUNITY)
Admission: RE | Admit: 2019-04-29 | Discharge: 2019-04-29 | Disposition: A | Discharge status: Home / Self Care | Payer: BC Managed Care – PPO | Source: Ambulatory Visit | Attending: Obstetrics and Gynecology | Admitting: Obstetrics and Gynecology

## 2019-04-29 DIAGNOSIS — G8929 Other chronic pain: Secondary | ICD-10-CM | POA: Diagnosis present

## 2019-04-29 DIAGNOSIS — R1032 Left lower quadrant pain: Secondary | ICD-10-CM | POA: Diagnosis not present

## 2019-05-04 ENCOUNTER — Emergency Department (HOSPITAL_COMMUNITY): Payer: BC Managed Care – PPO

## 2019-05-04 ENCOUNTER — Encounter (HOSPITAL_COMMUNITY): Payer: Self-pay | Admitting: Emergency Medicine

## 2019-05-04 ENCOUNTER — Emergency Department (HOSPITAL_COMMUNITY)
Admission: EM | Admit: 2019-05-04 | Discharge: 2019-05-04 | Disposition: A | Discharge status: Home / Self Care | Payer: BC Managed Care – PPO | Attending: Emergency Medicine | Admitting: Emergency Medicine

## 2019-05-04 ENCOUNTER — Other Ambulatory Visit: Payer: Self-pay

## 2019-05-04 DIAGNOSIS — Z3A01 Less than 8 weeks gestation of pregnancy: Secondary | ICD-10-CM | POA: Insufficient documentation

## 2019-05-04 DIAGNOSIS — O219 Vomiting of pregnancy, unspecified: Secondary | ICD-10-CM | POA: Insufficient documentation

## 2019-05-04 DIAGNOSIS — R509 Fever, unspecified: Secondary | ICD-10-CM | POA: Diagnosis not present

## 2019-05-04 DIAGNOSIS — R197 Diarrhea, unspecified: Secondary | ICD-10-CM | POA: Diagnosis not present

## 2019-05-04 DIAGNOSIS — R1011 Right upper quadrant pain: Secondary | ICD-10-CM | POA: Diagnosis not present

## 2019-05-04 DIAGNOSIS — Z79899 Other long term (current) drug therapy: Secondary | ICD-10-CM | POA: Insufficient documentation

## 2019-05-04 DIAGNOSIS — O26891 Other specified pregnancy related conditions, first trimester: Secondary | ICD-10-CM | POA: Diagnosis not present

## 2019-05-04 LAB — COMPREHENSIVE METABOLIC PANEL
ALT: 30 U/L (ref 0–44)
AST: 22 U/L (ref 15–41)
Albumin: 3.6 g/dL (ref 3.5–5.0)
Alkaline Phosphatase: 81 U/L (ref 38–126)
Anion gap: 10 (ref 5–15)
BUN: 7 mg/dL (ref 6–20)
CO2: 24 mmol/L (ref 22–32)
Calcium: 9 mg/dL (ref 8.9–10.3)
Chloride: 105 mmol/L (ref 98–111)
Creatinine, Ser: 0.6 mg/dL (ref 0.44–1.00)
GFR calc Af Amer: >60 mL/min (ref >60)
GFR calc non Af Amer: >60 mL/min (ref >60)
Glucose, Bld: 104 mg/dL — ABNORMAL HIGH (ref 70–99)
Potassium: 3.9 mmol/L (ref 3.5–5.1)
Sodium: 139 mmol/L (ref 135–145)
Total Bilirubin: 0.5 mg/dL (ref 0.3–1.2)
Total Protein: 7.6 g/dL (ref 6.5–8.1)

## 2019-05-04 LAB — CBC
HCT: 44.5 % (ref 36.0–46.0)
Hemoglobin: 14.5 g/dL (ref 12.0–15.0)
MCH: 28.4 pg (ref 26.0–34.0)
MCHC: 32.6 g/dL (ref 30.0–36.0)
MCV: 87.1 fL (ref 80.0–100.0)
Platelets: 338 10*3/uL (ref 150–400)
RBC: 5.11 MIL/uL (ref 3.87–5.11)
RDW: 13.2 % (ref 11.5–15.5)
WBC: 7.5 10*3/uL (ref 4.0–10.5)
nRBC: 0 % (ref 0.0–0.2)

## 2019-05-04 LAB — URINALYSIS, ROUTINE W REFLEX MICROSCOPIC
Bilirubin Urine: NEGATIVE
Glucose, UA: NEGATIVE mg/dL
Hgb urine dipstick: NEGATIVE
Ketones, ur: NEGATIVE mg/dL
Nitrite: NEGATIVE
Protein, ur: NEGATIVE mg/dL
Specific Gravity, Urine: 1.023 (ref 1.005–1.030)
pH: 5 (ref 5.0–8.0)

## 2019-05-04 LAB — HCG, QUANTITATIVE, PREGNANCY: hCG, Beta Chain, Quant, S: 13 m[IU]/mL — ABNORMAL HIGH (ref <5)

## 2019-05-04 LAB — I-STAT BETA HCG BLOOD, ED (MC, WL, AP ONLY): I-stat hCG, quantitative: 9.8 m[IU]/mL — ABNORMAL HIGH (ref <5)

## 2019-05-04 LAB — LIPASE, BLOOD: Lipase: 27 U/L (ref 11–51)

## 2019-05-04 MED ORDER — SODIUM CHLORIDE 0.9% FLUSH: 3.0000 mL | Freq: Once | INTRAVENOUS | Status: DC

## 2019-05-04 NOTE — ED Triage Notes (Signed)
C/o RUQ pain that radiates diagonally to LLQ.  Pain started after Thanksgiving but worse the last 24 hours.  PCP thought it was gallbladder but pt hasn't received call for imaging yet.  Reports nausea and diarrhea.

## 2019-05-04 NOTE — ED Notes (Signed)
Pt DC home after reviewing instructions . 

## 2019-05-04 NOTE — ED Provider Notes (Signed)
[facility] EMERGENCY DEPARTMENT Provider Note   CSN: [id number] Arrival date & time: 05/04/19  1037     History Chief Complaint  Patient presents with  . Abdominal Pain    Helen White is a 24 y.o. female with no significant past medical history who presents to the ED due to worsening RUQ pain that has been consistent since Thanksgiving. Patient notes that RUQ radiates to her LLQ. Pain is typically worse after meals and when lying flat. Per patient, she was seen by her PCP on Tuesday and was told her symptoms could be related to her gallbladder and was scheduled for a RUQ ultrasound, but hasn't received notification about when that would be. Patient notes that pain has worsened over the past 24 hours. She rates her pain a 10/10 last night and a 9/10 today. She has not tried anything for pain over the past week. She admits to taking Tylenol when the pain first started with no relief. RUQ pain is associated with nausea and non-bloody diarrhea. Patient admits to 1-2 episodes of non-bloody diarrhea daily with normal stools as well. Patient denies vomiting. Chart reviewed. Patient was seen by Dr. Lutricia Horsfall with OBGYN for chronic LLQ on 04/22/2019 and had an unremarkable [place] on 04/29/19 (results personally reviewed). Patient denies chest pain and shortness of breath. She admits to having a fever of 100.5 last night, but denies other URI symptoms. No known COVID exposures or sick contacts. Patient denies urinary and vaginal symptoms. Her LMP was 04/14/19. Patient denies any abdominal operations. No recent antibiotics or undercooked foods.     Past Medical History:  Diagnosis Date  . Medical history non-contributory     Patient Active Problem List   Diagnosis Date Noted  . Post-dates pregnancy 10/07/2017  . Supervision of normal first pregnancy, antepartum 05/11/2017    Past Surgical History:  Procedure Laterality Date  . NO PAST SURGERIES       OB History    Gravida    1   Para  1   Term  1   Preterm      AB      Living  1     SAB      TAB      Ectopic      Multiple  0   Live Births  1           Family History  Problem Relation Age of Onset  . Healthy Mother   . Healthy Father     Social History   Tobacco Use  . Smoking status: Never Smoker  . Smokeless tobacco: Never Used  Substance Use Topics  . Alcohol use: No  . Drug use: No    Home Medications Prior to Admission medications   Medication Sig Start Date End Date Taking? Authorizing Provider  acetaminophen (TYLENOL) 325 MG tablet Take 650 mg by mouth every 6 (six) hours as needed for headache.     [provider]  albuterol (VENTOLIN HFA) 108 (90 Base) MCG/ACT inhaler Inhale 2 puffs into the lungs every 4 (four) hours as needed for wheezing or shortness of breath. Patient not taking: Reported on 04/22/2019 04/07/19   Hall-Potvin, [place], PA-C  Docosahexaenoic Acid (DHA COMPLETE PO) Take 1 tablet by mouth at bedtime.     [provider]  fluticasone (FLONASE) 50 MCG/ACT nasal spray Place 2 sprays into both nostrils daily. Patient not taking: Reported on 04/22/2019 06/17/18   Raylene Everts, MD  Prenatal Vit-Fe  Fumarate-FA (PRENATAL MULTIVITAMIN) TABS tablet Take 1 tablet by mouth at bedtime.     [provider]  ipratropium (ATROVENT) 0.06 % nasal spray Place 2 sprays into both nostrils 4 (four) times daily. 03/17/18 01/03/19  Belinda Fisher, PA-C    Allergies    Penicillins  Review of Systems   Review of Systems  Constitutional: Positive for fever (last night). Negative for chills.  Respiratory: Negative for cough and shortness of breath.   Cardiovascular: Negative for chest pain.  Gastrointestinal: Positive for abdominal pain (RUQ), diarrhea and nausea. Negative for abdominal distention, anal bleeding, blood in stool and vomiting.  Genitourinary: Negative for dysuria, hematuria and vaginal discharge.  Musculoskeletal: Negative for back  pain.  All other systems reviewed and are negative.   Physical Exam Updated Vital Signs BP 108/63 (BP Location: Right Arm)   Pulse 97   Temp 98.3 F (36.8 C) (Oral)   Resp 18   LMP 04/14/2019   SpO2 100%   Physical Exam Vitals and nursing note reviewed.  Constitutional:      General: She is not in acute distress.    Appearance: She is not ill-appearing.     Comments: Well appearing 24 year old  HENT:     Head: Normocephalic.  Eyes:     General: No scleral icterus.    Conjunctiva/sclera: Conjunctivae normal.  Cardiovascular:     Rate and Rhythm: Normal rate and regular rhythm.     Pulses: Normal pulses.     Heart sounds: Normal heart sounds. No murmur. No friction rub. No gallop.   Pulmonary:     Effort: Pulmonary effort is normal.     Breath sounds: Normal breath sounds.  Abdominal:     General: Abdomen is flat. Bowel sounds are normal. There is no distension.     Palpations: Abdomen is soft.     Tenderness: There is abdominal tenderness. There is no right CVA tenderness, left CVA tenderness, guarding or rebound.     Comments: RUQ and LLQ tenderness. No rebound or guarding. Negative murphy's sign. No tenderness at McBurney's point.   Musculoskeletal:     Cervical back: Neck supple.     Comments: Able to move all 4 extremities without difficulty. No lower extremity edema.   Skin:    General: Skin is warm and dry.  Neurological:     General: No focal deficit present.     Mental Status: She is alert.  Psychiatric:        Mood and Affect: Mood normal.        Behavior: Behavior normal.     ED Results / Procedures / Treatments   Labs (all labs ordered are listed, but only abnormal results are displayed) Labs Reviewed  COMPREHENSIVE METABOLIC PANEL - Abnormal; Notable for the following components:      Result Value   Glucose, Bld 104 (*)    All other components within normal limits  URINALYSIS, ROUTINE W REFLEX MICROSCOPIC - Abnormal; Notable for the following  components:   APPearance HAZY (*)    Leukocytes,Ua TRACE (*)    Bacteria, UA RARE (*)    All other components within normal limits  HCG, QUANTITATIVE, PREGNANCY - Abnormal; Notable for the following components:   hCG, Beta Chain, Quant, S 13 (*)    All other components within normal limits  I-STAT BETA HCG BLOOD, ED ([facility], [facility], [facility] ONLY) - Abnormal; Notable for the following components:   I-stat hCG, quantitative 9.8 (*)    All  other components within normal limits  URINE CULTURE  LIPASE, BLOOD  CBC    EKG None  Radiology US Abdomen Limited RUQ  Result Date: 05/04/2019 CLINICAL DATA:  Right upper quadrant pain EXAM: ULTRASOUND ABDOMEN LIMITED RIGHT UPPER QUADRANT COMPARISON:  None. FINDINGS: Gallbladder: No gallstones or wall thickening visualized. No sonographic Murphy sign noted by sonographer. Common bile duct: Diameter: 2.3 mm Liver: No focal lesion identified. Increased hepatic parenchymal echogenicity. Portal vein is patent on color Doppler imaging with normal direction of blood flow towards the liver. Other: None. IMPRESSION: 1. No cholelithiasis or sonographic evidence of acute cholecystitis. 2. Increased hepatic echogenicity as can be seen with hepatic steatosis. Electronically Signed   By: Elige Ko   On: 05/04/2019 13:12    Procedures Procedures (including critical care time)  Medications Ordered in ED Medications  sodium chloride flush (NS) 0.9 % injection 3 mL (3 mLs Intravenous Not Given 05/04/19 1328)    ED Course  I have reviewed the triage vital signs and the nursing notes.  Pertinent labs & imaging results that were available during my care of the patient were reviewed by me and considered in my medical decision making (see chart for details).    MDM Rules/Calculators/A&P                     24 year old female presents to the ED due to RUQ pain that radiates in the LLQ since Thanksgiving. Pain is worse after meals.  Vitals all within normal limits.  Patient in  no acute distress and nonill appearing.  Physical exam reassuring with RUQ tenderness to palpation along with LLQ tenderness.  No rebound or guarding.  Negative Murphy sign.  No tenderness palpation at McBurney's point. Routine labs ordered. Will order RUQ to rule out gallbladder etiology. Suspect cholelithiasis, cholecystitis, or biliary colic. Patient was evaluated by her OBGYN for LLQ tenderness and had an unremarkable [place] on 1/19. Doubt appendicitis at this time.  I-stat hCG mildly elevated at 9.8. Will obtain quant hcg. Lipase normal at 27. Doubt pancreatitis. CBC unremarkable with no leukocytosis. CMP with mild hyperglycemia at 104, but otherwise unremarkable.  UA significant for trace leukocytes and rare bacteria. Given patient is not having any urinary symptoms will send for urine culture and hold off on treatment for UTI. Patient agreeable to plan. RUQ ultrasound personally reviewed which is negative for abnormalities of the gallbladder, but demonstrates hepatic steatosis. GI number given to patient at discharge to follow-up. HCG quantitative elevated at 13. Do not feel another complete pelvic US is warranted at this time given patient had an unremarkable one a few days ago and this pain has been more chronic in nature. Advised patient to call OBGYN tomorrow to schedule an appointment. Patient instructed to continue taking her prenatal and to only take Tylenol for pain control. Strict ED precautions discussed with patient. Patient states understanding and agrees to plan. Patient discharged home in no acute distress and stable vitals  Final Clinical Impression(s) / ED Diagnoses Final diagnoses:  RUQ pain  Less than [redacted] weeks gestation of pregnancy    Rx / DC Orders ED Discharge Orders    None       Jesusita Oka 05/04/19 1552    Jacalyn Lefevre, MD 05/04/19 1601

## 2019-05-04 NOTE — Discharge Instructions (Signed)
As discussed, your ultrasound showed a normal gallbladder, but showed some fatty liver. I have included the number for GI. Call tomorrow to schedule an appointment for further evaluation. You were also found to have elevated pregnancy hormones. You may be [redacted] week pregnant. Call your OBGYN tomorrow and see what they recommend doing. Continue taking your prenatal vitals. You may take tylenol as needed for pain, but no ibuprofen. Return to the ER for new or worsening symptoms.

## 2019-05-04 NOTE — ED Notes (Signed)
Pt asked about providing a urine sample. Pt stated that she cannot provide one at this time. Pt will try later.

## 2019-05-05 ENCOUNTER — Other Ambulatory Visit: Payer: Self-pay | Admitting: Obstetrics and Gynecology

## 2019-05-05 DIAGNOSIS — Z3490 Encounter for supervision of normal pregnancy, unspecified, unspecified trimester: Secondary | ICD-10-CM

## 2019-05-05 LAB — URINE CULTURE

## 2019-05-07 ENCOUNTER — Other Ambulatory Visit: Payer: Self-pay | Admitting: Family Medicine

## 2019-05-07 DIAGNOSIS — R197 Diarrhea, unspecified: Secondary | ICD-10-CM

## 2019-05-07 DIAGNOSIS — Z2089 Contact with and (suspected) exposure to other communicable diseases: Secondary | ICD-10-CM

## 2019-05-09 ENCOUNTER — Other Ambulatory Visit: Payer: BC Managed Care – PPO

## 2019-05-12 DIAGNOSIS — Z6835 Body mass index (BMI) 35.0-35.9, adult: Secondary | ICD-10-CM | POA: Diagnosis not present

## 2019-05-12 DIAGNOSIS — Z3201 Encounter for pregnancy test, result positive: Secondary | ICD-10-CM | POA: Diagnosis not present

## 2019-05-12 DIAGNOSIS — O200 Threatened abortion: Secondary | ICD-10-CM | POA: Diagnosis not present

## 2019-05-12 DIAGNOSIS — Z113 Encounter for screening for infections with a predominantly sexual mode of transmission: Secondary | ICD-10-CM | POA: Diagnosis not present

## 2019-05-12 DIAGNOSIS — Z124 Encounter for screening for malignant neoplasm of cervix: Secondary | ICD-10-CM | POA: Diagnosis not present

## 2019-05-12 DIAGNOSIS — Z01419 Encounter for gynecological examination (general) (routine) without abnormal findings: Secondary | ICD-10-CM | POA: Diagnosis not present

## 2019-05-12 DIAGNOSIS — Z331 Pregnant state, incidental: Secondary | ICD-10-CM | POA: Diagnosis not present

## 2019-05-12 DIAGNOSIS — N912 Amenorrhea, unspecified: Secondary | ICD-10-CM | POA: Diagnosis not present

## 2019-05-21 ENCOUNTER — Ambulatory Visit (HOSPITAL_COMMUNITY): Payer: BC Managed Care – PPO

## 2019-05-27 DIAGNOSIS — N911 Secondary amenorrhea: Secondary | ICD-10-CM | POA: Diagnosis not present

## 2019-06-09 DIAGNOSIS — Z3482 Encounter for supervision of other normal pregnancy, second trimester: Secondary | ICD-10-CM | POA: Diagnosis not present

## 2019-06-09 DIAGNOSIS — Z3400 Encounter for supervision of normal first pregnancy, unspecified trimester: Secondary | ICD-10-CM | POA: Diagnosis not present

## 2019-06-09 DIAGNOSIS — Z3685 Encounter for antenatal screening for Streptococcus B: Secondary | ICD-10-CM | POA: Diagnosis not present

## 2019-06-09 DIAGNOSIS — Z3481 Encounter for supervision of other normal pregnancy, first trimester: Secondary | ICD-10-CM | POA: Diagnosis not present

## 2019-06-09 LAB — OB RESULTS CONSOLE ANTIBODY SCREEN: Antibody Screen: NEGATIVE

## 2019-06-09 LAB — OB RESULTS CONSOLE RPR: RPR: NONREACTIVE

## 2019-06-09 LAB — OB RESULTS CONSOLE HIV ANTIBODY (ROUTINE TESTING): HIV: NONREACTIVE

## 2019-06-09 LAB — OB RESULTS CONSOLE RUBELLA ANTIBODY, IGM: Rubella: IMMUNE

## 2019-06-09 LAB — OB RESULTS CONSOLE ABO/RH: RH Type: POSITIVE

## 2019-06-09 LAB — OB RESULTS CONSOLE HEPATITIS B SURFACE ANTIGEN: Hepatitis B Surface Ag: NEGATIVE

## 2019-06-21 ENCOUNTER — Ambulatory Visit (HOSPITAL_COMMUNITY)
Admission: EM | Admit: 2019-06-21 | Discharge: 2019-06-21 | Disposition: A | Discharge status: Nursing Home (Includes ICF) | Payer: BC Managed Care – PPO | Source: Home / Self Care

## 2019-06-21 ENCOUNTER — Encounter (HOSPITAL_COMMUNITY): Payer: Self-pay | Admitting: Obstetrics and Gynecology

## 2019-06-21 ENCOUNTER — Inpatient Hospital Stay (HOSPITAL_COMMUNITY)
Admission: AD | Admit: 2019-06-21 | Discharge: 2019-06-21 | Disposition: A | Discharge status: Home / Self Care | Payer: BC Managed Care – PPO | Source: Ambulatory Visit | Attending: Obstetrics and Gynecology | Admitting: Obstetrics and Gynecology

## 2019-06-21 ENCOUNTER — Other Ambulatory Visit: Payer: Self-pay

## 2019-06-21 DIAGNOSIS — O26891 Other specified pregnancy related conditions, first trimester: Secondary | ICD-10-CM

## 2019-06-21 DIAGNOSIS — Z3A10 10 weeks gestation of pregnancy: Secondary | ICD-10-CM | POA: Diagnosis not present

## 2019-06-21 DIAGNOSIS — R109 Unspecified abdominal pain: Secondary | ICD-10-CM | POA: Insufficient documentation

## 2019-06-21 DIAGNOSIS — O26899 Other specified pregnancy related conditions, unspecified trimester: Secondary | ICD-10-CM

## 2019-06-21 LAB — URINALYSIS, ROUTINE W REFLEX MICROSCOPIC
Bilirubin Urine: NEGATIVE
Glucose, UA: NEGATIVE mg/dL
Hgb urine dipstick: NEGATIVE
Ketones, ur: 80 mg/dL — AB
Nitrite: NEGATIVE
Protein, ur: 30 mg/dL — AB
Specific Gravity, Urine: 1.025 (ref 1.005–1.030)
pH: 5 (ref 5.0–8.0)

## 2019-06-21 NOTE — MAU Provider Note (Signed)
Chief Complaint: Abdominal Pain   First Provider Initiated Contact with Patient 06/21/19 1851     SUBJECTIVE HPI: Helen White is a 24 y.o. G2P1001 at [redacted]w[redacted]d who presents to [facility] Admissions reporting abdominal cramping. Symptoms started yesterday. Reports constant lower abdominal cramping. Denies n/v/d, constipation, dysuria, vaginal bleeding, or vaginal discharge. Has next appointment at [facility] on Monday.   Location: abdomen Quality: cramping Severity: 5/10 on pain scale Duration: 1 day Timing: constant Modifying factors: nothing makes better or worse. Hasn't treated symptoms.  Associated signs and symptoms: none  Past Medical History:  Diagnosis Date  . Medical history non-contributory    OB History  Gravida Para Term Preterm AB Living  2 1 1     1   SAB TAB Ectopic Multiple Live Births        0 1    # Outcome Date GA Lbr Len/2nd Weight Sex Delivery Anes PTL Lv  2 Current           1 Term 10/08/17 [redacted]w[redacted]d 24:21 / 04:25 3575 g F Vag-Spont EPI, Local  LIV   Past Surgical History:  Procedure Laterality Date  . NO PAST SURGERIES     Social History   Socioeconomic History  . Marital status: Married    Spouse name: Not on file  . Number of children: Not on file  . Years of education: Not on file  . Highest education level: Not on file  Occupational History  . Not on file  Tobacco Use  . Smoking status: Never Smoker  . Smokeless tobacco: Never Used  Substance and Sexual Activity  . Alcohol use: No  . Drug use: No  . Sexual activity: Yes    Birth control/protection: Injection  Other Topics Concern  . Not on file  Social History Narrative  . Not on file   Social Determinants of Health   Financial Resource Strain:   . Difficulty of Paying Living Expenses:   Food Insecurity:   . Worried About [redacted]w[redacted]d in the Last Year:   . Programme researcher, broadcasting/film/video in the Last Year:   Transportation Needs:   . Barista (Medical):   Freight forwarder Lack of  Transportation (Non-Medical):   Physical Activity:   . Days of Exercise per Week:   . Minutes of Exercise per Session:   Stress:   . Feeling of Stress :   Social Connections:   . Frequency of Communication with Friends and Family:   . Frequency of Social Gatherings with Friends and Family:   . Attends Religious Services:   . Active Member of Clubs or Organizations:   . Attends Marland Kitchen Meetings:   Banker Marital Status:   Intimate Partner Violence:   . Fear of Current or Ex-Partner:   . Emotionally Abused:   Marland Kitchen Physically Abused:   . Sexually Abused:    Family History  Problem Relation Age of Onset  . Healthy Mother   . Healthy Father    No current facility-administered medications on file prior to encounter.   Current Outpatient Medications on File Prior to Encounter  Medication Sig Dispense Refill  . acetaminophen (TYLENOL) 325 MG tablet Take 650 mg by mouth every 6 (six) hours as needed for headache.     . albuterol (VENTOLIN HFA) 108 (90 Base) MCG/ACT inhaler Inhale 2 puffs into the lungs every 4 (four) hours as needed for wheezing or shortness of breath. (Patient not taking: Reported on 04/22/2019) 18 g 0  .  Docosahexaenoic Acid (DHA COMPLETE PO) Take 1 tablet by mouth at bedtime.     . fluticasone (FLONASE) 50 MCG/ACT nasal spray Place 2 sprays into both nostrils daily. (Patient not taking: Reported on 04/22/2019) 1 g 0  . Prenatal Vit-Fe Fumarate-FA (PRENATAL MULTIVITAMIN) TABS tablet Take 1 tablet by mouth at bedtime.     . [DISCONTINUED] ipratropium (ATROVENT) 0.06 % nasal spray Place 2 sprays into both nostrils 4 (four) times daily. 15 mL 0   Allergies  Allergen Reactions  . Penicillins     Has patient had a PCN reaction causing immediate rash, facial/tongue/throat swelling, SOB or lightheadedness with hypotension: No Has patient had a PCN reaction causing severe rash involving mucus membranes or skin necrosis: No Has patient had a PCN reaction that required  hospitalization: No Has patient had a PCN reaction occurring within the last 10 years: No If all of the above answers are "NO", then may proceed with Cephalosporin use.     I have reviewed patient's Past Medical Hx, Surgical Hx, Family Hx, Social Hx, medications and allergies.   Review of Systems  Constitutional: Negative.   Gastrointestinal: Positive for abdominal pain.  Genitourinary: Negative.     OBJECTIVE Patient Vitals for the past 24 hrs:  BP Temp Temp src Pulse Resp SpO2 Weight  06/21/19 1825 109/64 98.1 F (36.7 C) Oral 86 16 100 % 84.6 kg   Constitutional: Well-developed, well-nourished female in no acute distress.  Cardiovascular: normal rate & rhythm, no murmur Respiratory: normal rate and effort. Lung sounds clear throughout GI: Abd soft, non-tender, Pos BS x 4. No guarding or rebound tenderness MS: Extremities nontender, no edema, normal ROM Neurologic: Alert and oriented x 4.  GU: Cervix closed. No blood.   LAB RESULTS Results for orders placed or performed during the hospital encounter of 06/21/19 (from the past 24 hour(s))  Urinalysis, Routine w reflex microscopic     Status: Abnormal   Collection Time: 06/21/19  6:37 PM  Result Value Ref Range   Color, Urine AMBER (A) YELLOW   APPearance HAZY (A) CLEAR   Specific Gravity, Urine 1.025 1.005 - 1.030   pH 5.0 5.0 - 8.0   Glucose, UA NEGATIVE NEGATIVE mg/dL   Hgb urine dipstick NEGATIVE NEGATIVE   Bilirubin Urine NEGATIVE NEGATIVE   Ketones, ur 80 (A) NEGATIVE mg/dL   Protein, ur 30 (A) NEGATIVE mg/dL   Nitrite NEGATIVE NEGATIVE   Leukocytes,Ua SMALL (A) NEGATIVE   WBC, UA 6-10 0 - 5 WBC/hpf   Bacteria, UA MANY (A) NONE SEEN   Squamous Epithelial / LPF 11-20 0 - 5   Mucus PRESENT     IMAGING No results found.  MAU COURSE Orders Placed This Encounter  Procedures  . Culture, OB Urine  . Urinalysis, Routine w reflex microscopic  . Discharge patient   No orders of the defined types were placed in  this encounter.   MDM FHT present via doppler  Cervix closed  Pt declines pain medication in MAU  U/a with some leuks. Will send for urine culture. No urinary symptoms.   ASSESSMENT 1. Abdominal cramping affecting pregnancy   2. [redacted] weeks gestation of pregnancy     PLAN Discharge home in stable condition. Discussed reasons to return to MAU Urine culture pending  Follow-up Information    [facility] Follow up.   Why: keep scheduled appointment or call office as needed Contact information: [address] [place] [id number] [phone]  Allergies as of 06/21/2019      Reactions   Penicillins    Has patient had a PCN reaction causing immediate rash, facial/tongue/throat swelling, SOB or lightheadedness with hypotension: No Has patient had a PCN reaction causing severe rash involving mucus membranes or skin necrosis: No Has patient had a PCN reaction that required hospitalization: No Has patient had a PCN reaction occurring within the last 10 years: No If all of the above answers are "NO", then may proceed with Cephalosporin use.      Medication List    STOP taking these medications   albuterol 108 (90 Base) MCG/ACT inhaler Commonly known as: VENTOLIN HFA   fluticasone 50 MCG/ACT nasal spray Commonly known as: FLONASE     TAKE these medications   acetaminophen 325 MG tablet Commonly known as: TYLENOL Take 650 mg by mouth every 6 (six) hours as needed for headache.   DHA COMPLETE PO Take 1 tablet by mouth at bedtime.   prenatal multivitamin Tabs tablet Take 1 tablet by mouth at bedtime.        [name], NP 06/21/2019  7:21 PM

## 2019-06-21 NOTE — MAU Note (Signed)
Helen White is a 24 y.o. at [redacted]w[redacted]d here in MAU reporting: +lower abdominal pain.  Started as intermittent but now constant.  Cramping.  Onset of complaint: yesterday Pain score: 4-5/10. Has not taken any medication for the pain.   Denies vaginal bleeding or discharge.   Vitals:   06/21/19 1825  BP: 109/64  Pulse: 86  Resp: 16  Temp: 98.1 F (36.7 C)  SpO2: 100%    FHT: 173 via doppler Lab orders placed from triage: ua

## 2019-06-21 NOTE — ED Notes (Signed)
trans

## 2019-06-21 NOTE — ED Notes (Signed)
Spoke with dr Milus Glazier, he reviewed blood work in [employer] libr[employer].  [facility] to [facility]

## 2019-06-21 NOTE — ED Notes (Signed)
Patient is being discharged from the Urgent Care Center and sent to the Emergency Department via wheelchair by staff. Per dr Milus Glazier, patient is stable but in need of higher level of care due to documented pregnancy and abdominal pain. Patient is aware and verbalizes understanding of plan of care. There were no vitals filed for this visit.

## 2019-06-21 NOTE — Discharge Instructions (Signed)
Return to care  °· If you have heavier bleeding that soaks through more that 2 pads per hour for an hour or more °· If you bleed so much that you feel like you might pass out or you do pass out °· If you have significant abdominal pain that is not improved with Tylenol  °· If you develop a fever > 100.5 ° °

## 2019-06-22 LAB — CULTURE, OB URINE

## 2019-06-23 DIAGNOSIS — Z3400 Encounter for supervision of normal first pregnancy, unspecified trimester: Secondary | ICD-10-CM | POA: Diagnosis not present

## 2019-07-07 DIAGNOSIS — Z36 Encounter for antenatal screening for chromosomal anomalies: Secondary | ICD-10-CM | POA: Diagnosis not present

## 2019-07-07 DIAGNOSIS — Z3682 Encounter for antenatal screening for nuchal translucency: Secondary | ICD-10-CM | POA: Diagnosis not present

## 2019-07-07 DIAGNOSIS — Z3A12 12 weeks gestation of pregnancy: Secondary | ICD-10-CM | POA: Diagnosis not present

## 2019-08-19 DIAGNOSIS — Z3A18 18 weeks gestation of pregnancy: Secondary | ICD-10-CM | POA: Diagnosis not present

## 2019-08-19 DIAGNOSIS — Z361 Encounter for antenatal screening for raised alphafetoprotein level: Secondary | ICD-10-CM | POA: Diagnosis not present

## 2019-08-19 DIAGNOSIS — Z363 Encounter for antenatal screening for malformations: Secondary | ICD-10-CM | POA: Diagnosis not present

## 2019-09-19 ENCOUNTER — Inpatient Hospital Stay (HOSPITAL_COMMUNITY)
Admission: AD | Admit: 2019-09-19 | Discharge: 2019-09-20 | Disposition: A | Discharge status: Home / Self Care | Payer: No Typology Code available for payment source | Attending: Obstetrics and Gynecology | Admitting: Obstetrics and Gynecology

## 2019-09-19 ENCOUNTER — Encounter (HOSPITAL_COMMUNITY): Payer: Self-pay | Admitting: Obstetrics and Gynecology

## 2019-09-19 ENCOUNTER — Other Ambulatory Visit: Payer: Self-pay

## 2019-09-19 DIAGNOSIS — M549 Dorsalgia, unspecified: Secondary | ICD-10-CM | POA: Diagnosis not present

## 2019-09-19 DIAGNOSIS — O26892 Other specified pregnancy related conditions, second trimester: Secondary | ICD-10-CM | POA: Insufficient documentation

## 2019-09-19 DIAGNOSIS — Y990 Civilian activity done for income or pay: Secondary | ICD-10-CM | POA: Insufficient documentation

## 2019-09-19 DIAGNOSIS — Z3A23 23 weeks gestation of pregnancy: Secondary | ICD-10-CM

## 2019-09-19 DIAGNOSIS — Z880 Allergy status to penicillin: Secondary | ICD-10-CM | POA: Diagnosis not present

## 2019-09-19 DIAGNOSIS — W19XXXA Unspecified fall, initial encounter: Secondary | ICD-10-CM | POA: Diagnosis not present

## 2019-09-19 HISTORY — DX: Migraine, unspecified, not intractable, without status migrainosus: G43.909

## 2019-09-19 LAB — URINALYSIS, ROUTINE W REFLEX MICROSCOPIC
Bilirubin Urine: NEGATIVE
Glucose, UA: NEGATIVE mg/dL
Hgb urine dipstick: NEGATIVE
Ketones, ur: NEGATIVE mg/dL
Nitrite: NEGATIVE
Protein, ur: NEGATIVE mg/dL
Specific Gravity, Urine: 1.014 (ref 1.005–1.030)
pH: 5 (ref 5.0–8.0)

## 2019-09-19 MED ORDER — CYCLOBENZAPRINE HCL 5 MG PO TABS
10.0000 mg | ORAL_TABLET | Freq: Once | ORAL | Status: AC
  Administered 2019-09-19: 10 mg via ORAL
  Filled 2019-09-19: qty 2

## 2019-09-19 NOTE — MAU Note (Signed)
Pt reports to MAU stating yesterday she experienced a fall at work. Pt states she slipped on water and fell on her buttocks. Pt reports +FM since the fall. No bleeding or LOF. Pt reports lower back pain however that has been ongoing since the fall.

## 2019-09-19 NOTE — MAU Provider Note (Signed)
History     CSN: [id number]  Arrival date and time: 09/19/19 2149   First Provider Initiated Contact with Patient 09/19/19 2250      Chief Complaint  Patient presents with  . Fall  . Back Pain   Helen White is a 24 y.o. G2P1001 at [redacted]w[redacted]d who receives care at [facility].  She presents today for Fall and Back Pain. She states she fell yesterday at work around 330pm.  She states she fell on her buttocks and hit her back.  She states she did not seek care because she felt baby movement and was having some back pain that she states was a 4/10.  However, she states currently the pain is a 7-8/10 and is constant sharp pain.  She states she has tried tylenol without improvement.  She reports the pain is increased with sitting and improved with ambulation and stretching.  She endorses fetal movement and denies LOF and VB.       OB History    Gravida  2   Para  1   Term  1   Preterm      AB      Living  1     SAB      TAB      Ectopic      Multiple  0   Live Births  1           Past Medical History:  Diagnosis Date  . Medical history non-contributory   . Migraine     Past Surgical History:  Procedure Laterality Date  . NO PAST SURGERIES      Family History  Problem Relation Age of Onset  . Healthy Mother   . Healthy Father     Social History   Tobacco Use  . Smoking status: Never Smoker  . Smokeless tobacco: Never Used  Vaping Use  . Vaping Use: Never used  Substance Use Topics  . Alcohol use: No  . Drug use: No    Allergies:  Allergies  Allergen Reactions  . Penicillins     Has patient had a PCN reaction causing immediate rash, facial/tongue/throat swelling, SOB or lightheadedness with hypotension: No Has patient had a PCN reaction causing severe rash involving mucus membranes or skin necrosis: No Has patient had a PCN reaction that required hospitalization: No Has patient had a PCN reaction occurring within the last 10  years: No If all of the above answers are "NO", then may proceed with Cephalosporin use.     Medications Prior to Admission  Medication Sig Dispense Refill Last Dose  . acetaminophen (TYLENOL) 325 MG tablet Take 650 mg by mouth every 6 (six) hours as needed for headache.    09/19/2019 at Unknown time  . Docosahexaenoic Acid (DHA COMPLETE PO) Take 1 tablet by mouth at bedtime.    09/19/2019 at Unknown time  . Prenatal Vit-Fe Fumarate-FA (PRENATAL MULTIVITAMIN) TABS tablet Take 1 tablet by mouth at bedtime.    09/19/2019 at Unknown time    Review of Systems  Constitutional: Negative for chills and fever.  Respiratory: Positive for cough ("Allergy cough"). Negative for shortness of breath.   Gastrointestinal: Negative for abdominal pain, nausea and vomiting.  Genitourinary: Negative for difficulty urinating, dysuria, pelvic pain, vaginal bleeding and vaginal discharge.  Musculoskeletal: Positive for back pain.  Neurological: Negative for dizziness, light-headedness and headaches.   Physical Exam   Blood pressure (!) 107/57, pulse 92, temperature 97.8 F (36.6 C), temperature source  Oral, last menstrual period 04/14/2019, unknown if currently breastfeeding.  Physical Exam  Constitutional: She is oriented to person, place, and time.  HENT:  Head: Normocephalic and atraumatic.  Eyes: Conjunctivae are normal.  Cardiovascular: Normal rate, regular rhythm and normal heart sounds.  Respiratory: Effort normal and breath sounds normal. No respiratory distress.  GI: Soft.  Musculoskeletal:        General: Normal range of motion.     Cervical back: Normal range of motion.     Lumbar back: Tenderness present. No swelling, edema or signs of trauma.  Neurological: She is alert and oriented to person, place, and time.  Skin: Skin is warm and dry.  Psychiatric: Mood and thought content normal.    Fetal Assessment 145 bpm, Mod Var, -Decels, +15x15 Accels Toco: None graphed  MAU Course    Results for orders placed or performed during the hospital encounter of 09/19/19 (from the past 24 hour(s))  Urinalysis, Routine w reflex microscopic     Status: Abnormal   Collection Time: 09/19/19 10:42 PM  Result Value Ref Range   Color, Urine YELLOW YELLOW   APPearance HAZY (A) CLEAR   Specific Gravity, Urine 1.014 1.005 - 1.030   pH 5.0 5.0 - 8.0   Glucose, UA NEGATIVE NEGATIVE mg/dL   Hgb urine dipstick NEGATIVE NEGATIVE   Bilirubin Urine NEGATIVE NEGATIVE   Ketones, ur NEGATIVE NEGATIVE mg/dL   Protein, ur NEGATIVE NEGATIVE mg/dL   Nitrite NEGATIVE NEGATIVE   Leukocytes,Ua SMALL (A) NEGATIVE   RBC / HPF 0-5 0 - 5 RBC/hpf   WBC, UA 11-20 0 - 5 WBC/hpf   Bacteria, UA MANY (A) NONE SEEN   Squamous Epithelial / LPF 0-5 0 - 5   Mucus PRESENT    Hyaline Casts, UA PRESENT    No results found.  MDM PE Labs:UA EFM  Assessment and Plan  24 year old G2P1001  SIUP at 23.2weeks Cat I FT Back Pain S/P Fall  -POC reviewed -Exam performed and findings discussed. -Patient offered and accepts pain medication -Will give flexeril and apply heating pad to lower back area. -Will monitor and reassess. -NST Reactive.    Maryann Conners MSN, CNM 09/19/2019, 10:50 PM   Reassessment (12:22 AM)  Patient reports pain has improved with heat application and flexeril dosing. -Will send script to pharmacy. -Encouraged to rest tomorrow.  OOW excuse given. -Patient to keep scheduled appt for Monday June 14th -Precautions Given. -Encouraged to call or return to MAU if symptoms worsen or with the onset of new symptoms. -Discharged to home in improved condition.  Maryann Conners MSN, CNM Advanced Practice Provider, [facility]

## 2019-09-20 MED ORDER — CYCLOBENZAPRINE HCL 10 MG PO TABS
10.0000 mg | ORAL_TABLET | Freq: Two times a day (BID) | ORAL | 0 refills | Status: DC | PRN
Start: 2019-09-20 — End: 2019-12-28

## 2019-09-20 NOTE — Discharge Instructions (Signed)

## 2019-09-22 DIAGNOSIS — Z3A23 23 weeks gestation of pregnancy: Secondary | ICD-10-CM | POA: Diagnosis not present

## 2019-09-22 DIAGNOSIS — Z362 Encounter for other antenatal screening follow-up: Secondary | ICD-10-CM | POA: Diagnosis not present

## 2019-10-02 ENCOUNTER — Other Ambulatory Visit: Payer: Self-pay

## 2019-10-02 ENCOUNTER — Inpatient Hospital Stay (HOSPITAL_COMMUNITY)
Admission: AD | Admit: 2019-10-02 | Discharge: 2019-10-02 | Disposition: A | Discharge status: Home / Self Care | Payer: Medicaid Other | Attending: Obstetrics & Gynecology | Admitting: Obstetrics & Gynecology

## 2019-10-02 ENCOUNTER — Encounter (HOSPITAL_COMMUNITY): Payer: Self-pay | Admitting: Obstetrics & Gynecology

## 2019-10-02 DIAGNOSIS — O26892 Other specified pregnancy related conditions, second trimester: Secondary | ICD-10-CM | POA: Insufficient documentation

## 2019-10-02 DIAGNOSIS — Z3A25 25 weeks gestation of pregnancy: Secondary | ICD-10-CM | POA: Diagnosis not present

## 2019-10-02 DIAGNOSIS — Z880 Allergy status to penicillin: Secondary | ICD-10-CM | POA: Diagnosis not present

## 2019-10-02 DIAGNOSIS — R102 Pelvic and perineal pain: Secondary | ICD-10-CM | POA: Insufficient documentation

## 2019-10-02 DIAGNOSIS — O368120 Decreased fetal movements, second trimester, not applicable or unspecified: Secondary | ICD-10-CM | POA: Diagnosis not present

## 2019-10-02 DIAGNOSIS — R109 Unspecified abdominal pain: Secondary | ICD-10-CM | POA: Diagnosis present

## 2019-10-02 MED ORDER — COMFORT FIT MATERNITY SUPP MED MISC
1.0000 | Freq: Every day | 0 refills | Status: DC
Start: 2019-10-02 — End: 2020-01-09

## 2019-10-02 NOTE — Discharge Instructions (Signed)

## 2019-10-02 NOTE — MAU Provider Note (Signed)
Chief Complaint:  Abdominal Pain and Decreased Fetal Movement   First Provider Initiated Contact with Patient 10/02/19 2254      abdom pain, rx for preg belt   HPI: Helen White is a 24 y.o. G2P1001 at [redacted]w[redacted]d who presents to maternity admissions reporting abdominal pain while at work and decreased fetal movement since 1 pm.  She is feeling normal fetal movement since arrival in MAU but abdominal pain that worsens with walking/movement continues.   Location: lower abdomen Quality: cramping Severity: 8/10 on pain scale Duration: 7-8 hours Timing: constant Modifying factors: walking/position change worsen the pain Associated signs and symptoms: decreased fetal movement  HPI  Past Medical History: Past Medical History:  Diagnosis Date  . Medical history non-contributory   . Migraine     Past obstetric history: OB History  Gravida Para Term Preterm AB Living  2 1 1     1   SAB TAB Ectopic Multiple Live Births        0 1    # Outcome Date GA Lbr Len/2nd Weight Sex Delivery Anes PTL Lv  2 Current           1 Term 10/08/17 [redacted]w[redacted]d 24:21 / 04:25 3575 g F Vag-Spont EPI, Local  LIV    Past Surgical History: Past Surgical History:  Procedure Laterality Date  . NO PAST SURGERIES      Family History: Family History  Problem Relation Age of Onset  . Healthy Mother   . Healthy Father     Social History: Social History   Tobacco Use  . Smoking status: Never Smoker  . Smokeless tobacco: Never Used  Vaping Use  . Vaping Use: Never used  Substance Use Topics  . Alcohol use: No  . Drug use: No    Allergies:  Allergies  Allergen Reactions  . Penicillins     Has patient had a PCN reaction causing immediate rash, facial/tongue/throat swelling, SOB or lightheadedness with hypotension: No Has patient had a PCN reaction causing severe rash involving mucus membranes or skin necrosis: No Has patient had a PCN reaction that required hospitalization: No Has patient had a  PCN reaction occurring within the last 10 years: No If all of the above answers are "NO", then may proceed with Cephalosporin use.     Meds:  No medications prior to admission.    ROS:  Review of Systems  Constitutional: Negative for chills, fatigue and fever.  Eyes: Negative for visual disturbance.  Respiratory: Negative for shortness of breath.   Cardiovascular: Negative for chest pain.  Gastrointestinal: Positive for abdominal pain. Negative for nausea and vomiting.  Genitourinary: Negative for difficulty urinating, dysuria, flank pain, pelvic pain, vaginal bleeding, vaginal discharge and vaginal pain.  Neurological: Negative for dizziness and headaches.  Psychiatric/Behavioral: Negative.      I have reviewed patient's Past Medical Hx, Surgical Hx, Family Hx, Social Hx, medications and allergies.   Physical Exam   Patient Vitals for the past 24 hrs:  BP Temp Temp src Pulse Resp  10/02/19 2313 (!) 107/54 98 F (36.7 C) Oral 88 18  10/02/19 2312 (!) 107/54 -- -- 88 --  10/02/19 2234 (!) 104/45 97.8 F (36.6 C) -- 93 --   Constitutional: Well-developed, well-nourished female in no acute distress.  Cardiovascular: normal rate Respiratory: normal effort GI: Abd soft, non-tender, gravid appropriate for gestational age.  MS: Extremities nontender, no edema, normal ROM Neurologic: Alert and oriented x 4.  GU: Neg CVAT.  PELVIC EXAM: Cervix  pink, visually closed, without lesion, scant white creamy discharge, vaginal walls and external genitalia normal Bimanual exam: Cervix 0/long/high, firm, anterior, neg CMT, uterus nontender, nonenlarged, adnexa without tenderness, enlargement, or mass  Dilation: Closed Effacement (%): Thick Cervical Position: Posterior Exam by:: Sharen Counter CNM  FHT:  Baseline 145 , moderate variability, accelerations present, no decelerations Contractions: none on toco or to palpation   Labs: Results for orders placed or performed during  the hospital encounter of 10/02/19 (from the past 24 hour(s))  Urinalysis, Routine w reflex microscopic     Status: Abnormal   Collection Time: 10/02/19 10:54 PM  Result Value Ref Range   Color, Urine STRAW (A) YELLOW   APPearance CLEAR CLEAR   Specific Gravity, Urine 1.005 1.005 - 1.030   pH 7.0 5.0 - 8.0   Glucose, UA NEGATIVE NEGATIVE mg/dL   Hgb urine dipstick NEGATIVE NEGATIVE   Bilirubin Urine NEGATIVE NEGATIVE   Ketones, ur NEGATIVE NEGATIVE mg/dL   Protein, ur NEGATIVE NEGATIVE mg/dL   Nitrite NEGATIVE NEGATIVE   Leukocytes,Ua SMALL (A) NEGATIVE   RBC / HPF 0-5 0 - 5 RBC/hpf   WBC, UA 0-5 0 - 5 WBC/hpf   Bacteria, UA RARE (A) NONE SEEN   Squamous Epithelial / LPF 0-5 0 - 5      Imaging:  No results found.  MAU Course/MDM: Orders Placed This Encounter  Procedures  . Urinalysis, Routine w reflex microscopic  . Discharge patient    Meds ordered this encounter  Medications  . Elastic Bandages & Supports (COMFORT FIT MATERNITY SUPP MED) MISC    Sig: 1 Device by Does not apply route daily.    Dispense:  1 each    Refill:  0    Order Specific Question:   Supervising Provider    Answer:   CONSTANT, PEGGY [[id number]]     NST reviewed and reactive Cervix closed/thick/high, no signs of labor Pt with normal fetal movement and appropriate FHR tracing in MAU Pain c/w round ligament/musculoskeletal pain Rest/ice/heat/warm bath/Tylenol/pregnancy support belt Rx for support belt given tonight F/U in office as scheduled Pt discharge with strict preterm labor precautions.    Assessment: 1. Pain of round ligament affecting pregnancy, antepartum   2. Abdominal pain during pregnancy, second trimester   3. Decreased fetal movements in second trimester, single or unspecified fetus     Plan: Discharge home Labor precautions and fetal kick counts  Follow-up Information    Morris, Megan, DO Follow up.   Specialty: Obstetrics and Gynecology Why: As scheduled, return to MAU as  needed for emergencies.  Contact information: [address], [address] [place] [address] [phone]              Allergies as of 10/02/2019      Reactions   Penicillins    Has patient had a PCN reaction causing immediate rash, facial/tongue/throat swelling, SOB or lightheadedness with hypotension: No Has patient had a PCN reaction causing severe rash involving mucus membranes or skin necrosis: No Has patient had a PCN reaction that required hospitalization: No Has patient had a PCN reaction occurring within the last 10 years: No If all of the above answers are "NO", then may proceed with Cephalosporin use.      Medication List    TAKE these medications   acetaminophen 325 MG tablet Commonly known as: TYLENOL Take 650 mg by mouth every 6 (six) hours as needed for headache.   Comfort Fit  Maternity Supp Med Misc 1 Device by Does not apply route daily.   cyclobenzaprine 10 MG tablet Commonly known as: FLEXERIL Take 1 tablet (10 mg total) by mouth 2 (two) times daily as needed for muscle spasms.   DHA COMPLETE PO Take 1 tablet by mouth at bedtime.   prenatal multivitamin Tabs tablet Take 1 tablet by mouth at bedtime.       Sharen Counter Certified Nurse-Midwife 10/03/2019 3:06 AM

## 2019-10-02 NOTE — MAU Note (Signed)
Pt reports to MAU c/o NO FM since 1500. No bleeding or LOF. Pt reports abdominal pain in her lower abdomen that is a 8/10.

## 2019-10-03 LAB — URINALYSIS, ROUTINE W REFLEX MICROSCOPIC
Bilirubin Urine: NEGATIVE
Glucose, UA: NEGATIVE mg/dL
Hgb urine dipstick: NEGATIVE
Ketones, ur: NEGATIVE mg/dL
Nitrite: NEGATIVE
Protein, ur: NEGATIVE mg/dL
Specific Gravity, Urine: 1.005 (ref 1.005–1.030)
pH: 7 (ref 5.0–8.0)

## 2019-12-16 LAB — OB RESULTS CONSOLE GBS: GBS: NEGATIVE

## 2019-12-24 IMAGING — DX LEFT WRIST - COMPLETE 3+ VIEW
4 series · 4 of 4 positions shown · non-contrast
Comparison: None.

CLINICAL DATA: Fell 2 days ago.  Pain and swelling.

EXAM:
LEFT WRIST - COMPLETE 3+ VIEW

[wrist pa]
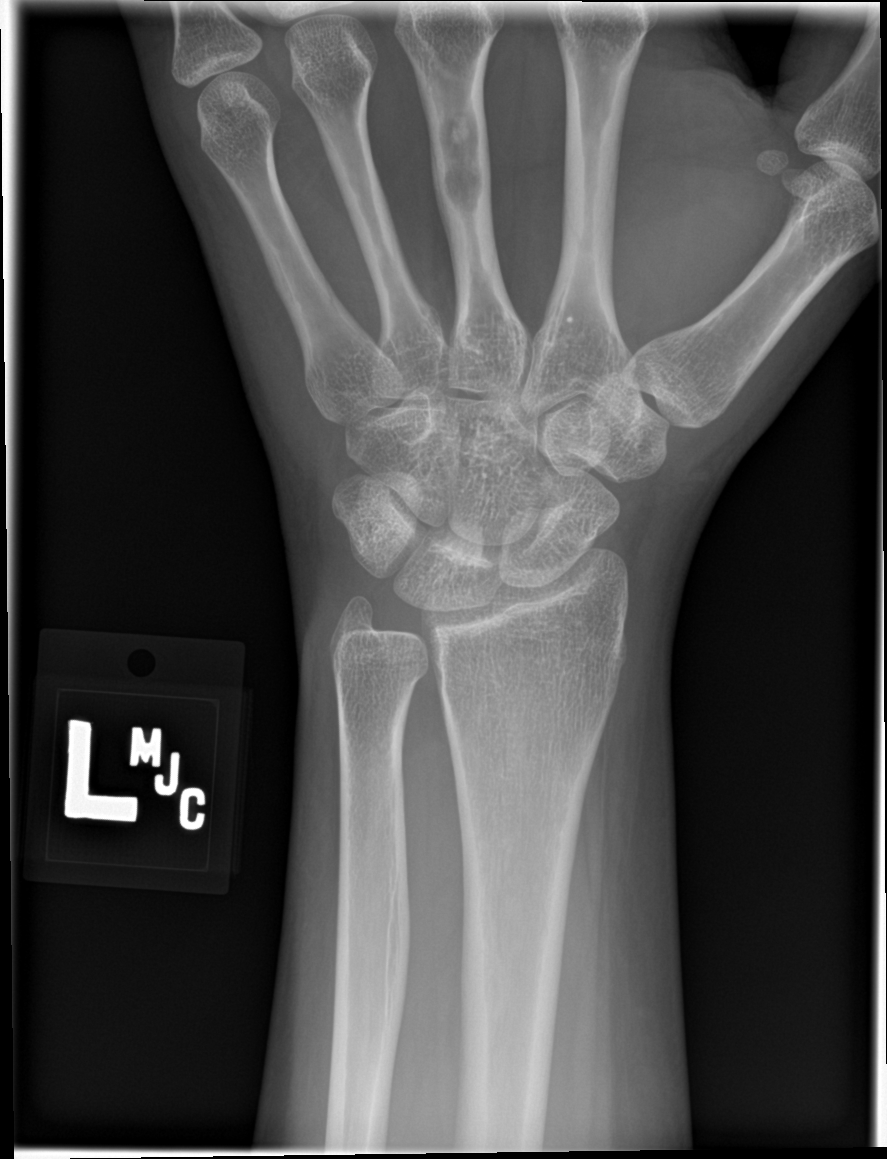

[wrist navicular]
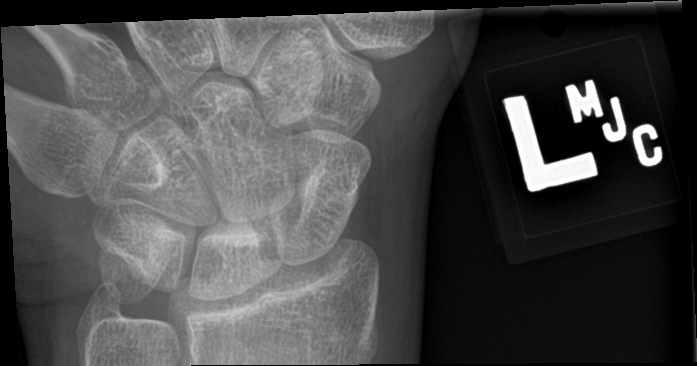

[wrist obl]
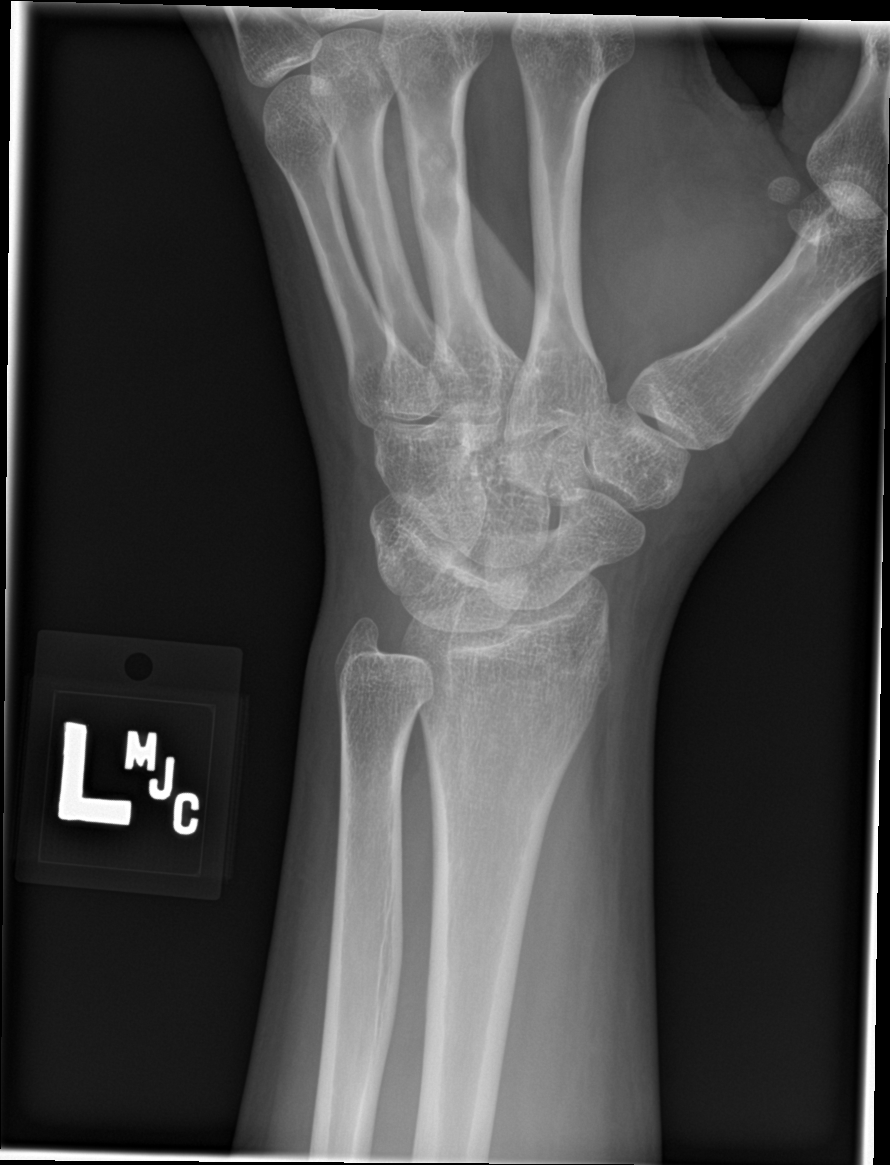

[wrist lat]
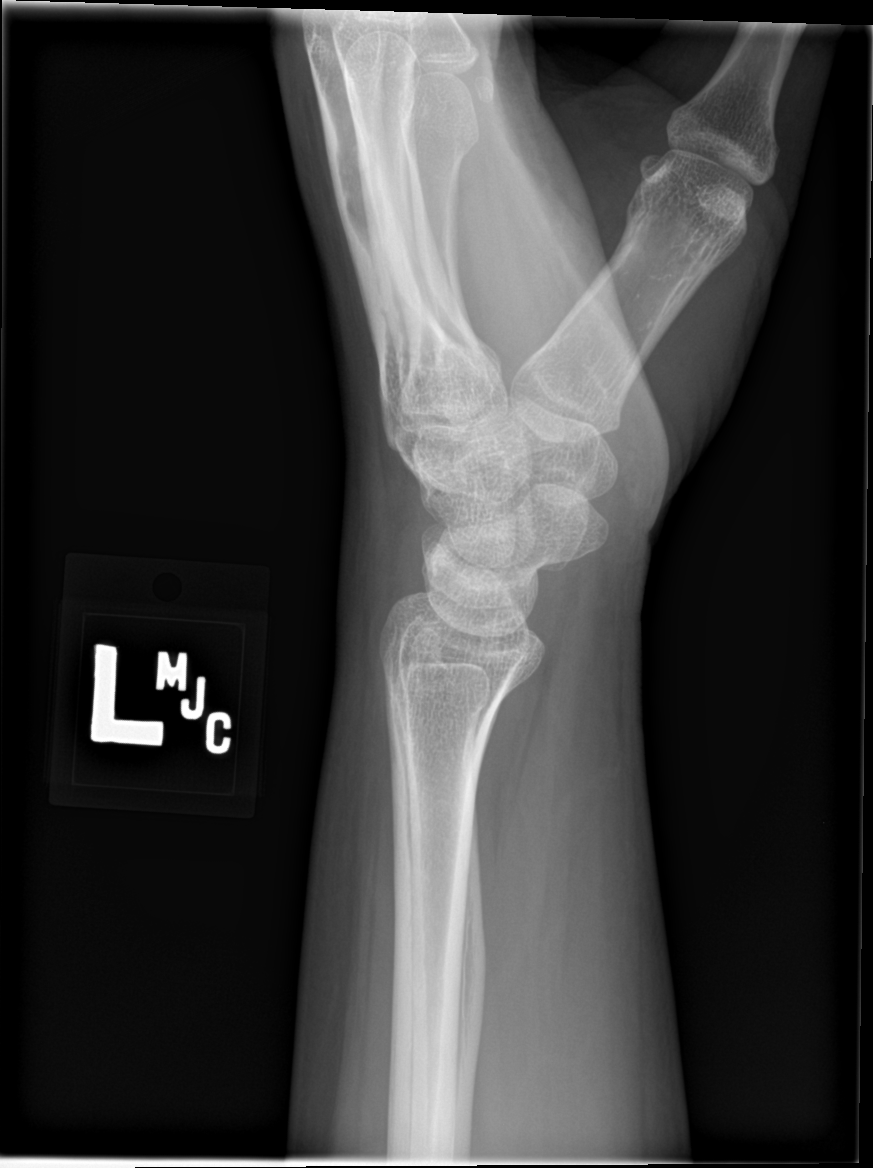

[4 of 4 positions shown; findings below may reference images not displayed]

FINDINGS: No evidence of fracture or dislocation. Incidental note is made of
medullary widening of the metacarpal of the long finger, probably
due to an incidental enchondroma.
IMPRESSION: No acute or traumatic finding.  See above.

## 2019-12-25 ENCOUNTER — Telehealth (HOSPITAL_COMMUNITY): Payer: Self-pay | Admitting: *Deleted

## 2019-12-25 NOTE — Telephone Encounter (Signed)
Preadmission screen  

## 2019-12-28 ENCOUNTER — Encounter (HOSPITAL_COMMUNITY): Payer: Self-pay | Admitting: Obstetrics & Gynecology

## 2019-12-28 ENCOUNTER — Other Ambulatory Visit: Payer: Self-pay

## 2019-12-28 ENCOUNTER — Inpatient Hospital Stay (HOSPITAL_COMMUNITY)
Admission: AD | Admit: 2019-12-28 | Discharge: 2019-12-28 | Disposition: A | Discharge status: Home / Self Care | Payer: Medicaid Other | Attending: Obstetrics & Gynecology | Admitting: Obstetrics & Gynecology

## 2019-12-28 DIAGNOSIS — Z880 Allergy status to penicillin: Secondary | ICD-10-CM | POA: Insufficient documentation

## 2019-12-28 DIAGNOSIS — O479 False labor, unspecified: Secondary | ICD-10-CM

## 2019-12-28 DIAGNOSIS — O368130 Decreased fetal movements, third trimester, not applicable or unspecified: Secondary | ICD-10-CM | POA: Diagnosis not present

## 2019-12-28 DIAGNOSIS — Z3A37 37 weeks gestation of pregnancy: Secondary | ICD-10-CM | POA: Diagnosis not present

## 2019-12-28 DIAGNOSIS — Z3689 Encounter for other specified antenatal screening: Secondary | ICD-10-CM

## 2019-12-28 DIAGNOSIS — O471 False labor at or after 37 completed weeks of gestation: Secondary | ICD-10-CM | POA: Insufficient documentation

## 2019-12-28 NOTE — MAU Note (Signed)
Helen White is a 24 y.o. at [redacted]w[redacted]d here in MAU reporting: woke up at 0400 with contractions that are 3 minutes apart. No bleeding or LOF. DFM, has felt some movement but not as much as normal. Was 1cm on Thursday.  Onset of complaint: today  Pain score: 5/10  Vitals:   12/28/19 0735  BP: 110/68  Pulse: 99  Resp: 16  Temp: 98.2 F (36.8 C)  SpO2: 98%     FHT:143  Lab orders placed from triage: none

## 2019-12-28 NOTE — MAU Provider Note (Signed)
History   [id number]   Chief Complaint  Patient presents with  . Contractions  . Decreased Fetal Movement    HPI Helen White is a 24 y.o. female  G2P1001 here for a labor evaluation and is also complaining of decreased fetal movement this morning since waking up. Since being in MAU reports return of normal movement. Denies LOF or vaginal bleeding.   Patient's last menstrual period was 04/14/2019.  OB History  Gravida Para Term Preterm AB Living  2 1 1     1   SAB TAB Ectopic Multiple Live Births        0 1    # Outcome Date GA Lbr Len/2nd Weight Sex Delivery Anes PTL Lv  2 Current           1 Term 10/08/17 [redacted]w[redacted]d 24:21 / 04:25 3575 g F Vag-Spont EPI, Local  LIV    Past Medical History:  Diagnosis Date  . Migraine     Family History  Problem Relation Age of Onset  . Healthy Mother   . Healthy Father     Social History   Socioeconomic History  . Marital status: Married    Spouse name: Not on file  . Number of children: Not on file  . Years of education: Not on file  . Highest education level: Not on file  Occupational History  . Not on file  Tobacco Use  . Smoking status: Never Smoker  . Smokeless tobacco: Never Used  Vaping Use  . Vaping Use: Never used  Substance and Sexual Activity  . Alcohol use: No  . Drug use: No  . Sexual activity: Yes    Birth control/protection: Injection  Other Topics Concern  . Not on file  Social History Narrative  . Not on file   Social Determinants of Health   Financial Resource Strain:   . Difficulty of Paying Living Expenses: Not on file  Food Insecurity:   . Worried About [redacted]w[redacted]d in the Last Year: Not on file  . Ran Out of Food in the Last Year: Not on file  Transportation Needs:   . Lack of Transportation (Medical): Not on file  . Lack of Transportation (Non-Medical): Not on file  Physical Activity:   . Days of Exercise per Week: Not on file  . Minutes of Exercise per Session: Not on file   Stress:   . Feeling of Stress : Not on file  Social Connections:   . Frequency of Communication with Friends and Family: Not on file  . Frequency of Social Gatherings with Friends and Family: Not on file  . Attends Religious Services: Not on file  . Active Member of Clubs or Organizations: Not on file  . Attends Programme researcher, broadcasting/film/video Meetings: Not on file  . Marital Status: Not on file    Allergies  Allergen Reactions  . Penicillins     Has patient had a PCN reaction causing immediate rash, facial/tongue/throat swelling, SOB or lightheadedness with hypotension: No Has patient had a PCN reaction causing severe rash involving mucus membranes or skin necrosis: No Has patient had a PCN reaction that required hospitalization: No Has patient had a PCN reaction occurring within the last 10 years: No If all of the above answers are "NO", then may proceed with Cephalosporin use.     No current facility-administered medications on file prior to encounter.   Current Outpatient Medications on File Prior to Encounter  Medication Sig Dispense Refill  .  Prenatal Vit-Fe Fumarate-FA (PRENATAL MULTIVITAMIN) TABS tablet Take 1 tablet by mouth at bedtime.     Marland Kitchen acetaminophen (TYLENOL) 325 MG tablet Take 650 mg by mouth every 6 (six) hours as needed for headache.     . cyclobenzaprine (FLEXERIL) 10 MG tablet Take 1 tablet (10 mg total) by mouth 2 (two) times daily as needed for muscle spasms. 20 tablet 0  . Elastic Bandages & Supports (COMFORT FIT MATERNITY SUPP MED) MISC 1 Device by Does not apply route daily. 1 each 0  . [DISCONTINUED] ipratropium (ATROVENT) 0.06 % nasal spray Place 2 sprays into both nostrils 4 (four) times daily. 15 mL 0     Review of Systems  Constitutional: Negative.   Gastrointestinal: Positive for abdominal pain.  Genitourinary: Negative.      Physical Exam   Vitals:   12/28/19 0735 12/28/19 1040 12/28/19 1043 12/28/19 1125  BP: 110/68 109/64  103/74  Pulse: 99 (!)  103  (!) 109  Resp: 16 16    Temp: 98.2 F (36.8 C) 98 F (36.7 C)    TempSrc: Oral Oral    SpO2: 98%  96%   Weight:      Height:        Physical Exam Vitals and nursing note reviewed.  Constitutional:      General: She is not in acute distress.    Appearance: Normal appearance.  HENT:     Head: Normocephalic and atraumatic.  Pulmonary:     Effort: Pulmonary effort is normal. No respiratory distress.  Neurological:     Mental Status: She is alert.  Psychiatric:        Mood and Affect: Mood normal.        Behavior: Behavior normal.    Fetal Tracing:  Baseline: 140 Variability: moderate Accelerations: 15x15 Decelerations: none  Toco: q2-5 minutes   MAU Course  Procedures  MDM  Patient reports good movement since arriving to MAU & has reactive fetal tracing.  Patient is contracting regularly but has made minimal cervical change in 3 hours. Gave patient option for additional monitoring. Patient reports that the contractions are not as painful & would like to be discharged home.   Assessment and Plan  A: 1. False labor   2. [redacted] weeks gestation of pregnancy   3. Decreased fetal movements in third trimester, single or unspecified fetus   4. NST (non-stress test) reactive    P: Discharge home Fetal movement form Labor precautions F/u with OB   Helen Horn, NP 12/28/2019 11:25 AM

## 2019-12-28 NOTE — Discharge Instructions (Signed)
Fetal Movement Counts Patient Name: ________________________________________________ Patient Due Date: ____________________ What is a fetal movement count?  A fetal movement count is the number of times that you feel your baby move during a certain amount of time. This may also be called a fetal kick count. A fetal movement count is recommended for every pregnant woman. You may be asked to start counting fetal movements as early as week 28 of your pregnancy. Pay attention to when your baby is most active. You may notice your baby's sleep and wake cycles. You may also notice things that make your baby move more. You should do a fetal movement count:  When your baby is normally most active.  At the same time each day. A good time to count movements is while you are resting, after having something to eat and drink. How do I count fetal movements? 1. Find a quiet, comfortable area. Sit, or lie down on your side. 2. Write down the date, the start time and stop time, and the number of movements that you felt between those two times. Take this information with you to your health care visits. 3. Write down your start time when you feel the first movement. 4. Count kicks, flutters, swishes, rolls, and jabs. You should feel at least 10 movements. 5. You may stop counting after you have felt 10 movements, or if you have been counting for 2 hours. Write down the stop time. 6. If you do not feel 10 movements in 2 hours, contact your health care provider for further instructions. Your health care provider may want to do additional tests to assess your baby's well-being. Contact a health care provider if:  You feel fewer than 10 movements in 2 hours.  Your baby is not moving like he or she usually does. Date: ____________ Start time: ____________ Stop time: ____________ Movements: ____________ Date: ____________ Start time: ____________ Stop time: ____________ Movements: ____________ Date: ____________  Start time: ____________ Stop time: ____________ Movements: ____________ Date: ____________ Start time: ____________ Stop time: ____________ Movements: ____________ Date: ____________ Start time: ____________ Stop time: ____________ Movements: ____________ Date: ____________ Start time: ____________ Stop time: ____________ Movements: ____________ Date: ____________ Start time: ____________ Stop time: ____________ Movements: ____________ Date: ____________ Start time: ____________ Stop time: ____________ Movements: ____________ Date: ____________ Start time: ____________ Stop time: ____________ Movements: ____________ This information is not intended to replace advice given to you by your health care provider. Make sure you discuss any questions you have with your health care provider. Document Revised: 11/14/2018 Document Reviewed: 11/14/2018 Elsevier Patient Education  2020 [employer]        Signs and Symptoms of Labor Labor is your body's natural process of moving your baby, placenta, and umbilical cord out of your uterus. The process of labor usually starts when your baby is full-term, between 37 and 40 weeks of pregnancy. How will I know when I am close to going into labor? As your body prepares for labor and the birth of your baby, you may notice the following symptoms in the weeks and days before true labor starts:  Having a strong desire to get your home ready to receive your new baby. This is called nesting. Nesting may be a sign that labor is approaching, and it may occur several weeks before birth. Nesting may involve cleaning and organizing your home.  Passing a small amount of thick, bloody mucus out of your vagina (normal bloody show or losing your mucus plug). This may happen more than a   week before labor begins, or it might occur right before labor begins as the opening of the cervix starts to widen (dilate). For some women, the entire mucus plug passes at once. For others,  smaller portions of the mucus plug may gradually pass over several days.  Your baby moving (dropping) lower in your pelvis to get into position for birth (lightening). When this happens, you may feel more pressure on your bladder and pelvic bone and less pressure on your ribs. This may make it easier to breathe. It may also cause you to need to urinate more often and have problems with bowel movements.  Having "practice contractions" (Braxton Hicks contractions) that occur at irregular (unevenly spaced) intervals that are more than 10 minutes apart. This is also called false labor. False labor contractions are common after exercise or sexual activity, and they will stop if you change position, rest, or drink fluids. These contractions are usually mild and do not get stronger over time. They may feel like: ? A backache or back pain. ? Mild cramps, similar to menstrual cramps. ? Tightening or pressure in your abdomen. Other early symptoms that labor may be starting soon include:  Nausea or loss of appetite.  Diarrhea.  Having a sudden burst of energy, or feeling very tired.  Mood changes.  Having trouble sleeping. How will I know when labor has begun? Signs that true labor has begun may include:  Having contractions that come at regular (evenly spaced) intervals and increase in intensity. This may feel like more intense tightening or pressure in your abdomen that moves to your back. ? Contractions may also feel like rhythmic pain in your upper thighs or back that comes and goes at regular intervals. ? For first-time mothers, this change in intensity of contractions often occurs at a more gradual pace. ? Women who have given birth before may notice a more rapid progression of contraction changes.  Having a feeling of pressure in the vaginal area.  Your water breaking (rupture of membranes). This is when the sac of fluid that surrounds your baby breaks. When this happens, you will notice  fluid leaking from your vagina. This may be clear or blood-tinged. Labor usually starts within 24 hours of your water breaking, but it may take longer to begin. ? Some women notice this as a gush of fluid. ? Others notice that their underwear repeatedly becomes damp. Follow these instructions at home:   When labor starts, or if your water breaks, call your health care provider or nurse care line. Based on your situation, they will determine when you should go in for an exam.  When you are in early labor, you may be able to rest and manage symptoms at home. Some strategies to try at home include: ? Breathing and relaxation techniques. ? Taking a warm bath or shower. ? Listening to music. ? Using a heating pad on the lower back for pain. If you are directed to use heat:  Place a towel between your skin and the heat source.  Leave the heat on for 20-30 minutes.  Remove the heat if your skin turns bright red. This is especially important if you are unable to feel pain, heat, or cold. You may have a greater risk of getting burned. Get help right away if:  You have painful, regular contractions that are 5 minutes apart or less.  Labor starts before you are [redacted] weeks along in your pregnancy.  You have a fever.  You have   a headache that does not go away.  You have bright red blood coming from your vagina.  You do not feel your baby moving.  You have a sudden onset of: ? Severe headache with vision problems. ? Nausea, vomiting, or diarrhea. ? Chest pain or shortness of breath. These symptoms may be an emergency. If your health care provider recommends that you go to the hospital or birth center where you plan to deliver, do not drive yourself. Have someone else drive you, or call emergency services (911 in the [place] Summary  Labor is your body's natural process of moving your baby, placenta, and umbilical cord out of your uterus.  The process of labor usually starts when your baby is  full-term, between 37 and 40 weeks of pregnancy.  When labor starts, or if your water breaks, call your health care provider or nurse care line. Based on your situation, they will determine when you should go in for an exam. This information is not intended to replace advice given to you by your health care provider. Make sure you discuss any questions you have with your health care provider. Document Revised: 12/25/2016 Document Reviewed: 09/01/2016 Elsevier Patient Education  2020 [employer] Inc.  

## 2019-12-29 ENCOUNTER — Ambulatory Visit
Admission: EM | Admit: 2019-12-29 | Discharge: 2019-12-29 | Discharge status: Against Medical Advice | Payer: BLUE CROSS/BLUE SHIELD

## 2019-12-29 ENCOUNTER — Other Ambulatory Visit: Payer: Self-pay

## 2019-12-29 NOTE — ED Notes (Signed)
Spoke with pt sts she found a place to do rapid covid and no longer wants to be seen here

## 2020-01-01 ENCOUNTER — Telehealth (HOSPITAL_COMMUNITY): Payer: Self-pay | Admitting: *Deleted

## 2020-01-01 NOTE — Telephone Encounter (Signed)
Preadmission screen  

## 2020-01-02 ENCOUNTER — Telehealth (HOSPITAL_COMMUNITY): Payer: Self-pay | Admitting: *Deleted

## 2020-01-02 NOTE — Telephone Encounter (Signed)
Preadmission screen  

## 2020-01-05 ENCOUNTER — Telehealth (HOSPITAL_COMMUNITY): Payer: Self-pay | Admitting: *Deleted

## 2020-01-05 NOTE — Telephone Encounter (Signed)
Preadmission screen  

## 2020-01-06 ENCOUNTER — Other Ambulatory Visit (HOSPITAL_COMMUNITY): Payer: Self-pay

## 2020-01-08 ENCOUNTER — Inpatient Hospital Stay (HOSPITAL_COMMUNITY): Payer: Medicaid Other

## 2020-01-08 ENCOUNTER — Inpatient Hospital Stay (HOSPITAL_COMMUNITY): Payer: Medicaid Other | Admitting: Anesthesiology

## 2020-01-08 ENCOUNTER — Inpatient Hospital Stay (HOSPITAL_COMMUNITY)
Admission: AD | Admit: 2020-01-08 | Discharge: 2020-01-09 | DRG: 807 | Disposition: A | Discharge status: Home / Self Care | Payer: Medicaid Other | Attending: Obstetrics and Gynecology | Admitting: Obstetrics and Gynecology

## 2020-01-08 ENCOUNTER — Encounter (HOSPITAL_COMMUNITY): Payer: Self-pay | Admitting: Obstetrics and Gynecology

## 2020-01-08 DIAGNOSIS — Z3A39 39 weeks gestation of pregnancy: Secondary | ICD-10-CM

## 2020-01-08 DIAGNOSIS — O26893 Other specified pregnancy related conditions, third trimester: Secondary | ICD-10-CM | POA: Diagnosis present

## 2020-01-08 DIAGNOSIS — Z880 Allergy status to penicillin: Secondary | ICD-10-CM | POA: Diagnosis not present

## 2020-01-08 DIAGNOSIS — Z3490 Encounter for supervision of normal pregnancy, unspecified, unspecified trimester: Secondary | ICD-10-CM | POA: Diagnosis present

## 2020-01-08 DIAGNOSIS — Z23 Encounter for immunization: Secondary | ICD-10-CM | POA: Diagnosis not present

## 2020-01-08 LAB — TYPE AND SCREEN
ABO/RH(D): O POS
Antibody Screen: NEGATIVE

## 2020-01-08 LAB — CBC
HCT: 35.6 % — ABNORMAL LOW (ref 36.0–46.0)
Hemoglobin: 11.3 g/dL — ABNORMAL LOW (ref 12.0–15.0)
MCH: 27 pg (ref 26.0–34.0)
MCHC: 31.7 g/dL (ref 30.0–36.0)
MCV: 85.2 fL (ref 80.0–100.0)
Platelets: 205 10*3/uL (ref 150–400)
RBC: 4.18 MIL/uL (ref 3.87–5.11)
RDW: 14.3 % (ref 11.5–15.5)
WBC: 6.6 10*3/uL (ref 4.0–10.5)
nRBC: 0 % (ref 0.0–0.2)

## 2020-01-08 LAB — RPR: RPR Ser Ql: NONREACTIVE

## 2020-01-08 MED ORDER — FENTANYL-BUPIVACAINE-NACL 0.5-0.125-0.9 MG/250ML-% EP SOLN
12.0000 mL/h | EPIDURAL | Status: DC | PRN
  Filled 2020-01-08: qty 250

## 2020-01-08 MED ORDER — BENZOCAINE-MENTHOL 20-0.5 % EX AERO
1.0000 "application " | INHALATION_SPRAY | CUTANEOUS | Status: DC | PRN
  Administered 2020-01-08: 1 via TOPICAL
  Filled 2020-01-08: qty 56

## 2020-01-08 MED ORDER — TERBUTALINE SULFATE 1 MG/ML IJ SOLN: 0.2500 mg | Freq: Once | INTRAMUSCULAR | Status: DC | PRN

## 2020-01-08 MED ORDER — WITCH HAZEL-GLYCERIN EX PADS: 1.0000 "application " | MEDICATED_PAD | CUTANEOUS | Status: DC | PRN

## 2020-01-08 MED ORDER — MEDROXYPROGESTERONE ACETATE 150 MG/ML IM SUSP: 150.0000 mg | INTRAMUSCULAR | Status: DC | PRN

## 2020-01-08 MED ORDER — MEASLES, MUMPS & RUBELLA VAC IJ SOLR: 0.5000 mL | Freq: Once | INTRAMUSCULAR | Status: DC

## 2020-01-08 MED ORDER — ONDANSETRON HCL 4 MG PO TABS: 4.0000 mg | ORAL_TABLET | ORAL | Status: DC | PRN

## 2020-01-08 MED ORDER — PHENYLEPHRINE 40 MCG/ML (10ML) SYRINGE FOR IV PUSH (FOR BLOOD PRESSURE SUPPORT): 80.0000 ug | PREFILLED_SYRINGE | INTRAVENOUS | Status: DC | PRN

## 2020-01-08 MED ORDER — EPHEDRINE 5 MG/ML INJ: 10.0000 mg | INTRAVENOUS | Status: DC | PRN

## 2020-01-08 MED ORDER — DIPHENHYDRAMINE HCL 50 MG/ML IJ SOLN: 12.5000 mg | INTRAMUSCULAR | Status: DC | PRN

## 2020-01-08 MED ORDER — LIDOCAINE HCL (PF) 1 % IJ SOLN
INTRAMUSCULAR | Status: DC | PRN
  Administered 2020-01-08: 7 mL via EPIDURAL
  Administered 2020-01-08: 5 mL via EPIDURAL

## 2020-01-08 MED ORDER — SENNOSIDES-DOCUSATE SODIUM 8.6-50 MG PO TABS
2.0000 | ORAL_TABLET | ORAL | Status: DC
  Administered 2020-01-08: 2 via ORAL
  Filled 2020-01-08: qty 2

## 2020-01-08 MED ORDER — OXYTOCIN BOLUS FROM INFUSION
333.0000 mL | Freq: Once | INTRAVENOUS | Status: AC
  Administered 2020-01-08: 333 mL/h via INTRAVENOUS

## 2020-01-08 MED ORDER — LACTATED RINGERS IV SOLN
500.0000 mL | Freq: Once | INTRAVENOUS | Status: AC
  Administered 2020-01-08: 500 mL via INTRAVENOUS

## 2020-01-08 MED ORDER — OXYCODONE-ACETAMINOPHEN 5-325 MG PO TABS: 1.0000 | ORAL_TABLET | ORAL | Status: DC | PRN

## 2020-01-08 MED ORDER — OXYTOCIN-SODIUM CHLORIDE 30-0.9 UT/500ML-% IV SOLN
1.0000 m[IU]/min | INTRAVENOUS | Status: DC
  Administered 2020-01-08: 2 m[IU]/min via INTRAVENOUS
  Filled 2020-01-08: qty 500

## 2020-01-08 MED ORDER — FENTANYL CITRATE (PF) 2500 MCG/50ML IJ SOLN
INTRAMUSCULAR | Status: DC | PRN
Start: 2020-01-08 — End: 2020-01-08
  Administered 2020-01-08: 12 mL/h via EPIDURAL

## 2020-01-08 MED ORDER — LACTATED RINGERS IV SOLN
500.0000 mL | INTRAVENOUS | Status: DC | PRN
  Administered 2020-01-08: 500 mL via INTRAVENOUS

## 2020-01-08 MED ORDER — COCONUT OIL OIL
1.0000 "application " | TOPICAL_OIL | Status: DC | PRN
  Administered 2020-01-09: 1 via TOPICAL

## 2020-01-08 MED ORDER — ONDANSETRON HCL 4 MG/2ML IJ SOLN: 4.0000 mg | Freq: Four times a day (QID) | INTRAMUSCULAR | Status: DC | PRN

## 2020-01-08 MED ORDER — IBUPROFEN 600 MG PO TABS
600.0000 mg | ORAL_TABLET | Freq: Four times a day (QID) | ORAL | Status: DC
  Administered 2020-01-08 – 2020-01-09 (×3): 600 mg via ORAL
  Filled 2020-01-08 (×4): qty 1

## 2020-01-08 MED ORDER — MISOPROSTOL 25 MCG QUARTER TABLET
25.0000 ug | ORAL_TABLET | ORAL | Status: DC
  Administered 2020-01-08: 25 ug via VAGINAL
  Filled 2020-01-08: qty 1

## 2020-01-08 MED ORDER — PRENATAL MULTIVITAMIN CH
1.0000 | ORAL_TABLET | Freq: Every day | ORAL | Status: DC
  Administered 2020-01-09: 1 via ORAL
  Filled 2020-01-08: qty 1

## 2020-01-08 MED ORDER — LACTATED RINGERS IV SOLN: INTRAVENOUS | Status: DC

## 2020-01-08 MED ORDER — LIDOCAINE HCL (PF) 1 % IJ SOLN: 30.0000 mL | INTRAMUSCULAR | Status: DC | PRN

## 2020-01-08 MED ORDER — OXYCODONE-ACETAMINOPHEN 5-325 MG PO TABS
1.0000 | ORAL_TABLET | ORAL | Status: DC | PRN
  Administered 2020-01-09: 1 via ORAL
  Filled 2020-01-08: qty 1

## 2020-01-08 MED ORDER — DIPHENHYDRAMINE HCL 25 MG PO CAPS: 25.0000 mg | ORAL_CAPSULE | Freq: Four times a day (QID) | ORAL | Status: DC | PRN

## 2020-01-08 MED ORDER — OXYCODONE-ACETAMINOPHEN 5-325 MG PO TABS: 2.0000 | ORAL_TABLET | ORAL | Status: DC | PRN

## 2020-01-08 MED ORDER — ZOLPIDEM TARTRATE 5 MG PO TABS: 5.0000 mg | ORAL_TABLET | Freq: Every evening | ORAL | Status: DC | PRN

## 2020-01-08 MED ORDER — SIMETHICONE 80 MG PO CHEW: 80.0000 mg | CHEWABLE_TABLET | ORAL | Status: DC | PRN

## 2020-01-08 MED ORDER — BUTORPHANOL TARTRATE 1 MG/ML IJ SOLN
1.0000 mg | INTRAMUSCULAR | Status: DC | PRN
  Administered 2020-01-08: 1 mg via INTRAVENOUS
  Filled 2020-01-08: qty 1

## 2020-01-08 MED ORDER — DIBUCAINE (PERIANAL) 1 % EX OINT: 1.0000 "application " | TOPICAL_OINTMENT | CUTANEOUS | Status: DC | PRN

## 2020-01-08 MED ORDER — TETANUS-DIPHTH-ACELL PERTUSSIS 5-2.5-18.5 LF-MCG/0.5 IM SUSP: 0.5000 mL | Freq: Once | INTRAMUSCULAR | Status: DC

## 2020-01-08 MED ORDER — OXYTOCIN-SODIUM CHLORIDE 30-0.9 UT/500ML-% IV SOLN: 2.5000 [IU]/h | INTRAVENOUS | Status: DC

## 2020-01-08 MED ORDER — ACETAMINOPHEN 325 MG PO TABS: 650.0000 mg | ORAL_TABLET | ORAL | Status: DC | PRN

## 2020-01-08 MED ORDER — ACETAMINOPHEN 325 MG PO TABS
650.0000 mg | ORAL_TABLET | ORAL | Status: DC | PRN
  Administered 2020-01-08 – 2020-01-09 (×2): 650 mg via ORAL
  Filled 2020-01-08 (×2): qty 2

## 2020-01-08 MED ORDER — ONDANSETRON HCL 4 MG/2ML IJ SOLN: 4.0000 mg | INTRAMUSCULAR | Status: DC | PRN

## 2020-01-08 MED ORDER — SOD CITRATE-CITRIC ACID 500-334 MG/5ML PO SOLN: 30.0000 mL | ORAL | Status: DC | PRN

## 2020-01-08 NOTE — Anesthesia Preprocedure Evaluation (Signed)
Anesthesia Evaluation  Patient identified by MRN, date of birth, ID band Patient awake    Reviewed: Allergy & Precautions, NPO status , Patient's Chart, lab work & pertinent test results  Airway Mallampati: I  TM Distance: >3 FB Neck ROM: Full    Dental no notable dental hx. (+) Teeth Intact   Pulmonary neg pulmonary ROS,    Pulmonary exam normal breath sounds clear to auscultation       Cardiovascular negative cardio ROS Normal cardiovascular exam Rhythm:Regular Rate:Normal     Neuro/Psych negative psych ROS   GI/Hepatic negative GI ROS, Neg liver ROS,   Endo/Other  negative endocrine ROS  Renal/GU negative Renal ROS  negative genitourinary   Musculoskeletal negative musculoskeletal ROS (+)   Abdominal (+) + obese,   Peds  Hematology negative hematology ROS (+)   Anesthesia Other Findings   Reproductive/Obstetrics (+) Pregnancy                             Lab Results  Component Value Date   WBC 6.6 01/08/2020   HGB 11.3 (L) 01/08/2020   HCT 35.6 (L) 01/08/2020   MCV 85.2 01/08/2020   PLT 205 01/08/2020    Anesthesia Physical  Anesthesia Plan  ASA: II  Anesthesia Plan: Epidural   Post-op Pain Management:    Induction:   PONV Risk Score and Plan:   Airway Management Planned:   Additional Equipment:   Intra-op Plan:   Post-operative Plan:   Informed Consent: I have reviewed the patients History and Physical, chart, labs and discussed the procedure including the risks, benefits and alternatives for the proposed anesthesia with the patient or authorized representative who has indicated his/her understanding and acceptance.       Plan Discussed with:   Anesthesia Plan Comments:         Anesthesia Quick Evaluation

## 2020-01-08 NOTE — Anesthesia Procedure Notes (Signed)
Epidural Patient location during procedure: OB Start time: 01/08/2020 10:55 AM End time: 01/08/2020 10:58 AM  Staffing Anesthesiologist: Leilani Able, MD Performed: anesthesiologist   Preanesthetic Checklist Completed: patient identified, IV checked, site marked, risks and benefits discussed, surgical consent, monitors and equipment checked, pre-op evaluation and timeout performed  Epidural Patient position: sitting Prep: DuraPrep and site prepped and draped Patient monitoring: continuous pulse ox and blood pressure Approach: midline Location: L3-L4 Injection technique: LOR air  Needle:  Needle type: Tuohy  Needle gauge: 17 G Needle length: 9 cm and 9 Needle insertion depth: 6 cm Catheter type: closed end flexible Catheter size: 19 Gauge Catheter at skin depth: 11 cm Test dose: negative and Other  Assessment Events: blood not aspirated, injection not painful, no injection resistance, no paresthesia and negative IV test  Additional Notes Reason for block:procedure for pain

## 2020-01-08 NOTE — H&P (Signed)
Helen White is a 24 y.o. female presenting for IOL for elective reasons.  Preg complicated by maternal covid 11 days ago.  Now without sxs.  GBS -. OB History    Gravida  2   Para  1   Term  1   Preterm      AB      Living  1     SAB      TAB      Ectopic      Multiple  0   Live Births  1          Past Medical History:  Diagnosis Date  . Migraine    Past Surgical History:  Procedure Laterality Date  . NO PAST SURGERIES     Family History: family history includes Healthy in her father and mother. Social History:  reports that she has never smoked. She has never used smokeless tobacco. She reports that she does not drink alcohol and does not use drugs.     Maternal Diabetes: No Genetic Screening: Normal Maternal Ultrasounds/Referrals: Normal Fetal Ultrasounds or other Referrals:  None Maternal Substance Abuse:  No Significant Maternal Medications:  None Significant Maternal Lab Results:  Group B Strep negative Other Comments:  None  Review of Systems History Dilation: 3 Effacement (%): 70 Station: -2 Exam by:: Dr. Rana Snare Blood pressure 106/65, pulse 75, temperature 98.2 F (36.8 C), temperature source Oral, resp. rate 17, height 5\' 4"  (1.626 m), weight 93.2 kg, last menstrual period 04/14/2019, unknown if currently breastfeeding. Exam Physical Exam  Prenatal labs: ABO, Rh: --/--/O POS (09/30 0505) Antibody: NEG (09/30 0505) Rubella: Immune (03/01 0000) RPR: Nonreactive (03/01 0000)  HBsAg: Negative (03/01 0000)  HIV: Non-reactive (03/01 0000)  GBS: Negative/-- (09/07 0000)   Assessment/Plan: IUP at 39 weeks IOL with cytotec. Now AROM and pitocin and anticipate SVD   12-19-1978 01/08/2020, 9:27 AM

## 2020-01-09 LAB — CBC
HCT: 31 % — ABNORMAL LOW (ref 36.0–46.0)
Hemoglobin: 9.8 g/dL — ABNORMAL LOW (ref 12.0–15.0)
MCH: 27.1 pg (ref 26.0–34.0)
MCHC: 31.6 g/dL (ref 30.0–36.0)
MCV: 85.9 fL (ref 80.0–100.0)
Platelets: 180 10*3/uL (ref 150–400)
RBC: 3.61 MIL/uL — ABNORMAL LOW (ref 3.87–5.11)
RDW: 14.3 % (ref 11.5–15.5)
WBC: 8.1 10*3/uL (ref 4.0–10.5)
nRBC: 0 % (ref 0.0–0.2)

## 2020-01-09 MED ORDER — INFLUENZA VAC SPLIT QUAD 0.5 ML IM SUSY
0.5000 mL | PREFILLED_SYRINGE | INTRAMUSCULAR | Status: AC
  Administered 2020-01-09: 0.5 mL via INTRAMUSCULAR
  Filled 2020-01-09: qty 0.5

## 2020-01-09 NOTE — Anesthesia Postprocedure Evaluation (Signed)
Anesthesia Post Note  Patient: Helen White  Procedure(s) Performed: AN AD HOC LABOR EPIDURAL     Patient location during evaluation: Mother Baby Anesthesia Type: Epidural Level of consciousness: awake and alert, oriented and patient cooperative Pain management: pain level controlled Vital Signs Assessment: post-procedure vital signs reviewed and stable Respiratory status: spontaneous breathing Cardiovascular status: stable Postop Assessment: no headache, epidural receding, patient able to bend at knees and no signs of nausea or vomiting Anesthetic complications: no Comments: Pt. States she is walking.  Pain score of 5 in back possibly d/t uterine contractions with BF.  Pt. Encouraged to communicate pain needs with RN.    No complications documented.  Last Vitals:  Vitals:   01/09/20 0200 01/09/20 0526  BP: 104/64 107/69  Pulse: 70 86  Resp: 18 18  Temp: 36.6 C 36.6 C  SpO2: 99% 99%    Last Pain:  Vitals:   01/09/20 0526  TempSrc: Oral  PainSc: 5    Pain Goal: Patients Stated Pain Goal: 2 (01/08/20 1655)                 Merrilyn Puma

## 2020-01-09 NOTE — Discharge Summary (Signed)
Postpartum Discharge Summary  Date of Service updated 01/09/20     Patient Name: Helen White [facility] DOB: [birth year] MRN: [id number]  Date of admission: 01/08/2020 Delivery date:01/08/2020  Delivering provider: Louretta Shorten  Date of discharge: 01/09/2020  Admitting diagnosis: Encounter for elective induction of labor [Z34.90] Intrauterine pregnancy: [redacted]w[redacted]d     Secondary diagnosis:  Active Problems:   Encounter for elective induction of labor  Additional problems: none    Discharge diagnosis: Term Pregnancy Delivered                                              Post partum procedures:none Augmentation: AROM, Pitocin and Cytotec Complications: None  Hospital course: Induction of Labor With Vaginal Delivery   24 y.o. yo [id number] at [redacted]w[redacted]d was admitted to the hospital 01/08/2020 for induction of labor.  Indication for induction: Favorable cervix at term.  Patient had an uncomplicated labor course as follows: Membrane Rupture Time/Date: 8:55 AM ,01/08/2020   Delivery Method:Vaginal, Spontaneous  Episiotomy: None  Lacerations:  2nd degree  Details of delivery can be found in separate delivery note.  Patient had a routine postpartum course. Patient is discharged home 01/09/20.  Newborn Data: Birth date:01/08/2020  Birth time:2:19 PM  Gender:Female  Living status:Living  Apgars:9 ,9  Weight:3521 g   Magnesium Sulfate received: No BMZ received: No Rhophylac:N/A MMR:N/A T-DaP:Given prenatally Flu: N/A Transfusion:Yes  Physical exam  Vitals:   01/08/20 1740 01/08/20 2145 01/09/20 0200 01/09/20 0526  BP: 113/70 114/68 104/64 107/69  Pulse: 79 71 70 86  Resp: $Remo'16 17 18 18  'Kejvg$ Temp: 98.2 F (36.8 C) 97.7 F (36.5 C) 97.8 F (36.6 C) 97.8 F (36.6 C)  TempSrc: Oral Oral Oral Oral  SpO2: 99% 98% 99% 99%  Weight:      Height:       General: alert Lochia: appropriate Uterine Fundus: firm Incision: Healing well with no significant drainage DVT Evaluation: No evidence of DVT  seen on physical exam. Labs: Lab Results  Component Value Date   WBC 8.1 01/09/2020   HGB 9.8 (L) 01/09/2020   HCT 31.0 (L) 01/09/2020   MCV 85.9 01/09/2020   PLT 180 01/09/2020   CMP Latest Ref Rng & Units 05/04/2019  Glucose 70 - 99 mg/dL 104(H)  BUN 6 - 20 mg/dL 7  Creatinine 0.44 - 1.00 mg/dL 0.60  Sodium 135 - 145 mmol/L 139  Potassium 3.5 - 5.1 mmol/L 3.9  Chloride 98 - 111 mmol/L 105  CO2 22 - 32 mmol/L 24  Calcium 8.9 - 10.3 mg/dL 9.0  Total Protein 6.5 - 8.1 g/dL 7.6  Total Bilirubin 0.3 - 1.2 mg/dL 0.5  Alkaline Phos 38 - 126 U/L 81  AST 15 - 41 U/L 22  ALT 0 - 44 U/L 30   Edinburgh Score: Edinburgh Postnatal Depression Scale Screening Tool 11/05/2017  I have been able to laugh and see the funny side of things. 0  I have looked forward with enjoyment to things. 0  I have blamed myself unnecessarily when things went wrong. 1  I have been anxious or worried for no good reason. 0  I have felt scared or panicky for no good reason. 0  Things have been getting on top of me. 1  I have been so unhappy that I have had difficulty sleeping. 0  I have felt sad or  miserable. 0  I have been so unhappy that I have been crying. 0  The thought of harming myself has occurred to me. 0  Edinburgh Postnatal Depression Scale Total 2      After visit meds:  Allergies as of 01/09/2020      Reactions   Penicillins    Has patient had a PCN reaction causing immediate rash, facial/tongue/throat swelling, SOB or lightheadedness with hypotension: No Has patient had a PCN reaction causing severe rash involving mucus membranes or skin necrosis: No Has patient had a PCN reaction that required hospitalization: No Has patient had a PCN reaction occurring within the last 10 years: No If all of the above answers are "NO", then may proceed with Cephalosporin use.      Medication List    STOP taking these medications   Comfort Fit Maternity Supp Med Misc     TAKE these medications    acetaminophen 325 MG tablet Commonly known as: TYLENOL Take 650 mg by mouth every 6 (six) hours as needed for headache.   prenatal multivitamin Tabs tablet Take 1 tablet by mouth at bedtime.        Discharge home in stable condition Infant Feeding: Breast Infant Disposition:home with mother Discharge instruction: per After Visit Summary and Postpartum booklet. Activity: Advance as tolerated. Pelvic rest for 6 weeks.  Diet: routine diet Anticipated Birth Control: Unsure Postpartum Appointment:6 weeks Additional Postpartum F/U: none Future Appointments:No future appointments. Follow up Visit:      01/09/2020 Tyson Dense, MD

## 2020-01-10 ENCOUNTER — Ambulatory Visit: Payer: Self-pay

## 2020-01-10 NOTE — Lactation Note (Signed)
This note was copied from a baby's chart. Lactation Consultation Note  Patient Name: Helen White KAJGO'T Date: 01/10/2020 Reason for consult: Follow-up assessment   Maternal Data Does the patient have breastfeeding experience prior to this delivery?: No  Feeding Feeding Type: Breast Fed Nipple Type: Slow - flow  LATCH Score Latch: Grasps breast easily, tongue down, lips flanged, rhythmical sucking.  Audible Swallowing: None  Type of Nipple: Everted at rest and after stimulation  Comfort (Breast/Nipple): Soft / non-tender  Hold (Positioning): Assistance needed to correctly position infant at breast and maintain latch.  LATCH Score: 7  Interventions Interventions: Adjust position;Support pillows;Assisted with latch;Breast feeding basics reviewed  Lactation Tools Discussed/Used     Consult Status Consult Status: Complete Date: 01/10/20 Follow-up type: Call as needed    Dartha Rozzell R Diontae Route 01/10/2020, 10:33 AM

## 2020-01-10 NOTE — Lactation Note (Signed)
This note was copied from a baby's chart. Lactation Consultation Note  Patient Name: Girl Iracema Lanagan STMHD'Q Date: 01/10/2020 Reason for consult: Initial assessment;Term;Infant weight loss P2, 36 hour term female infant with -3% weight loss. LC entered room, mom asleep in chair and dad holding infant swadled in blankets. Per dad, infant BF well in L&D but not since in [place], infant not been latching to breast so they started giving infant formula. Mom is very tired and she did use DEBP a few times. LC mention to dad, that RN and LC services can help assist mom with latching infant at breast. We encourage mom to continue using the DEBP every 3 hours for 15 minutes to help establish her milk supply.  Per dad, mom does have DEBP at home.  LC services will follow up with mom due to  mom was sleeping when LC entered the room.  LC left booklet with dad about LC  Services, hot line, out patient LC services and [facility] Support group with dad.   Maternal Data    Feeding    LATCH Score                   Interventions Interventions: Skin to skin;DEBP;Breast feeding basics reviewed  Lactation Tools Discussed/Used     Consult Status Consult Status: Follow-up Date: 01/10/20 Follow-up type: In-patient    Danelle Earthly 01/10/2020, 2:45 AM

## 2020-04-10 NOTE — L&D Delivery Note (Signed)
Delivery Note At 2:31 PM a viable female was delivered via Vaginal, Spontaneous (Presentation:  Occiput Anterior).  APGAR: 8, 9; weight pending .   Placenta status: Spontaneous, Intact.  Cord: 3 vessels with the following complications: None.  Cord pH: not sent  Anesthesia: Epidural Episiotomy: None Lacerations:  1st degree Suture Repair: 3.0 vicryl Est. Blood Loss (mL): 400cc - mild uterine atony - one dose TXA given with resolution.    It's a boy!!   Mom to postpartum.  Baby to Couplet care / Skin to Skin.  Ranae Pila 04/08/2021, 2:57 PM

## 2020-06-03 IMAGING — US US PELVIS COMPLETE WITH TRANSVAGINAL
1 series · 15 of 25 positions shown · non-contrast
Comparison: None

CLINICAL DATA: Chronic LEFT lower quadrant pain for 1 year; LMP
04/14/2019

EXAM:
TRANSABDOMINAL AND TRANSVAGINAL ULTRASOUND OF PELVIS
TECHNIQUE: Both transabdominal and transvaginal ultrasound examinations of the
pelvis were performed. Transabdominal technique was performed for
global imaging of the pelvis including uterus, ovaries, adnexal
regions, and pelvic cul-de-sac. It was necessary to proceed with
endovaginal exam following the transabdominal exam to visualize the
endometrium and ovaries.

[Series 1: us pelvis complete with transvaginal · 15 of 38 slices shown]
[im 1/38]
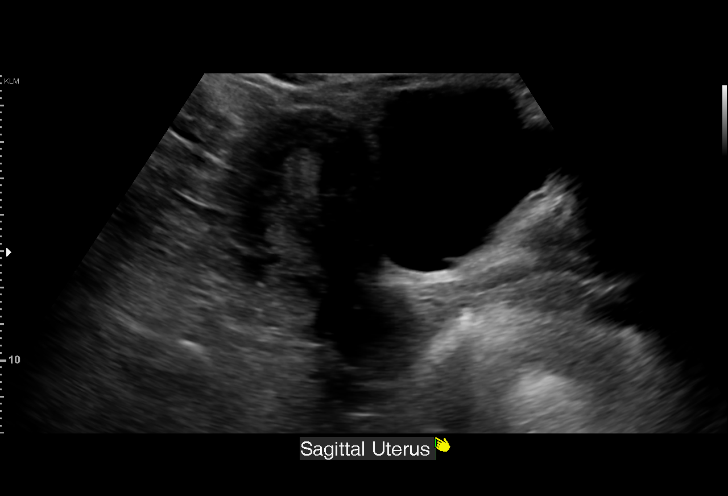
[im 4/38]
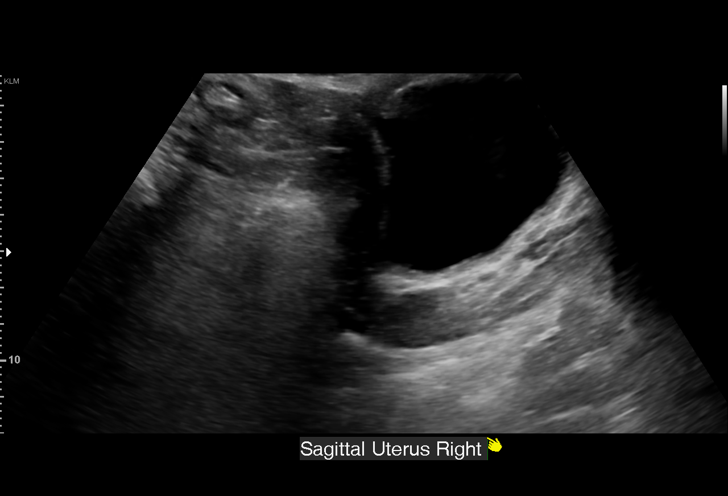
[im 7/38]
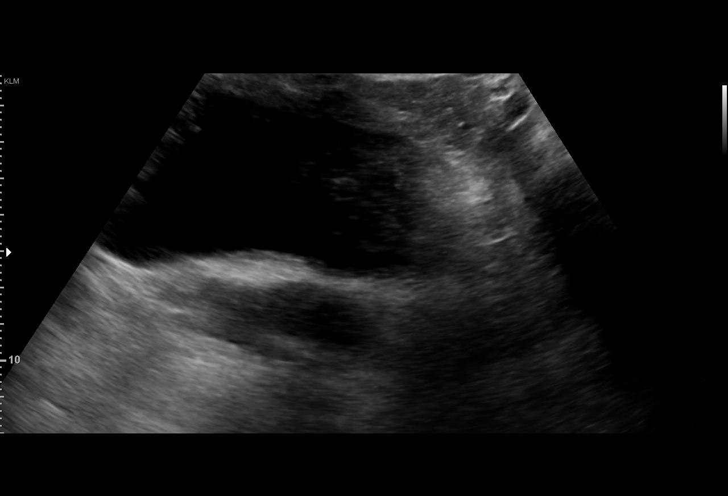
[im 8/38]
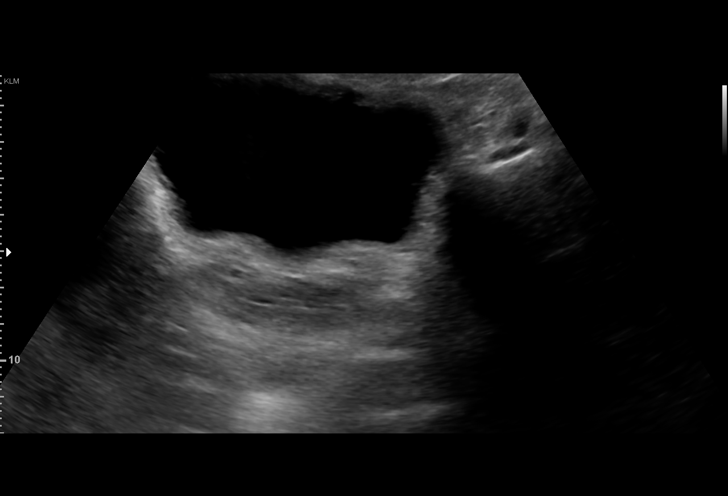
[im 11/38]
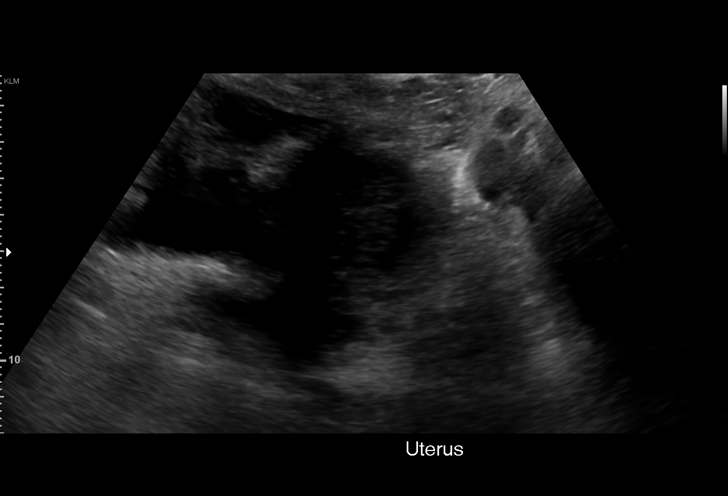
[im 14/38]
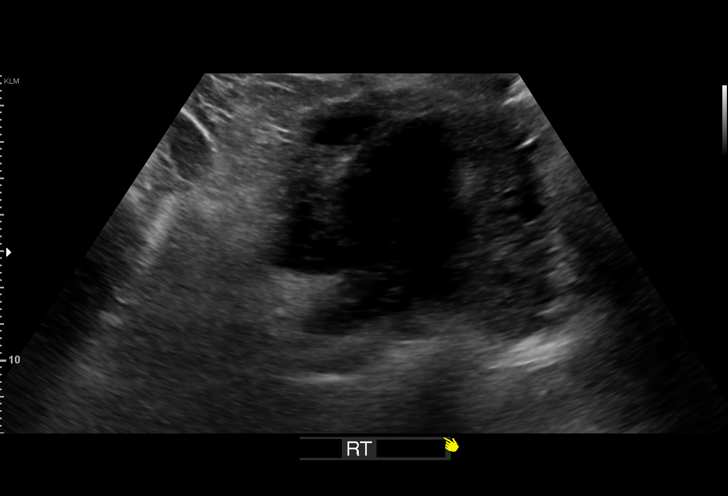
[im 16/38]
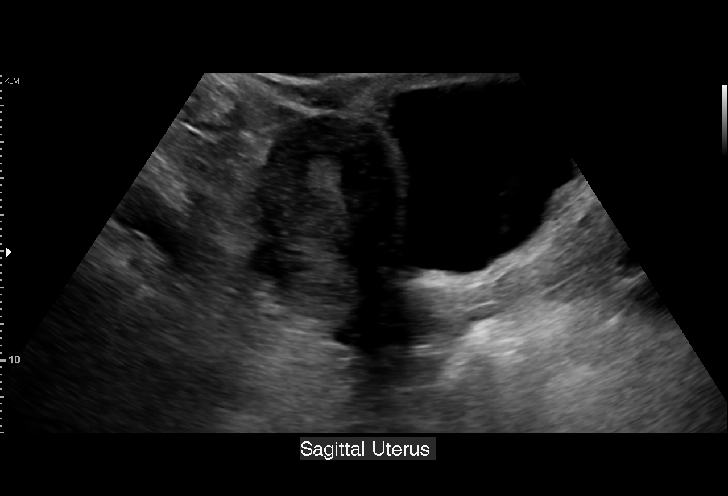
[im 19/38]
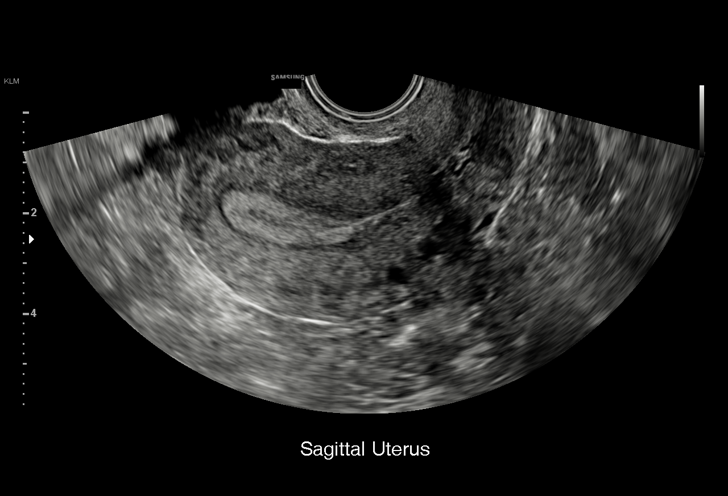
[im 22/38]
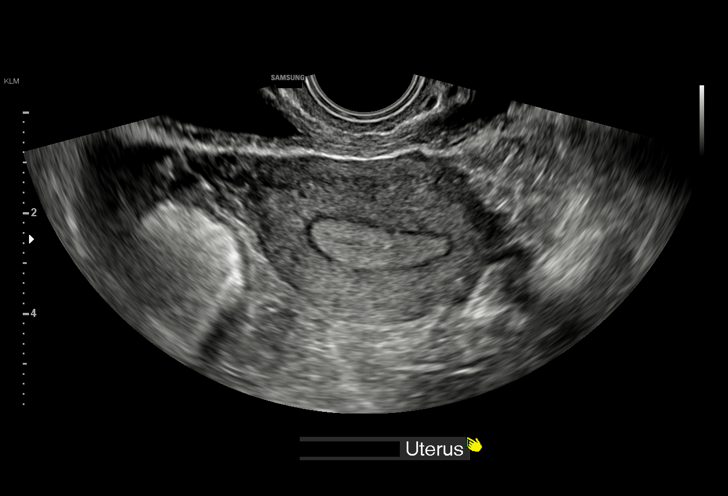
[im 24/38]
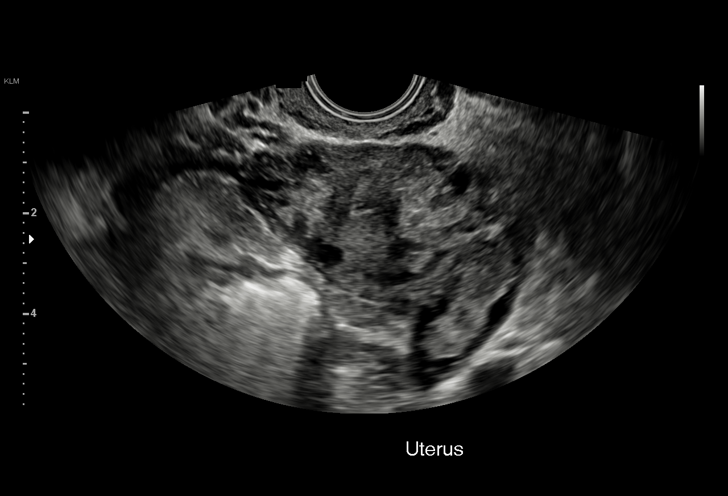
[im 27/38]
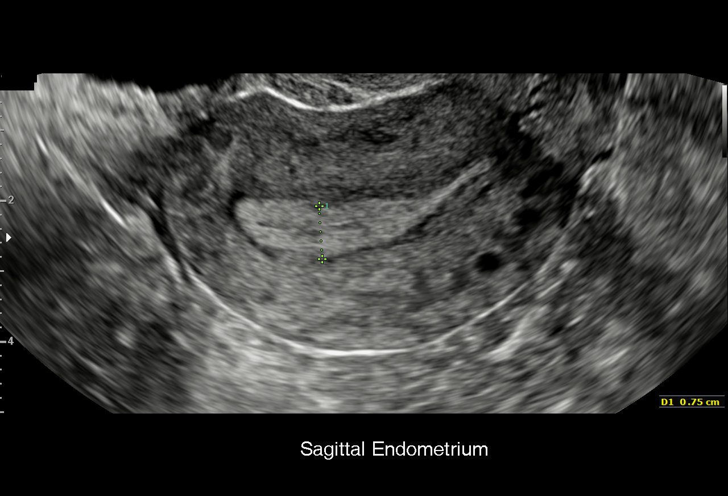
[im 30/38]
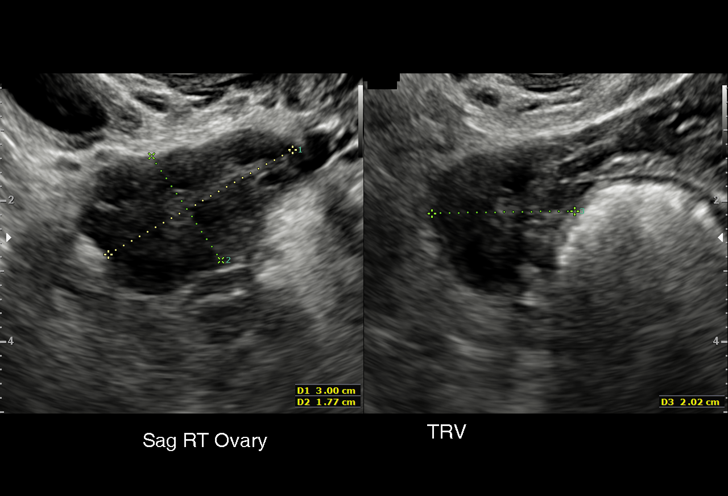
[im 31/38]
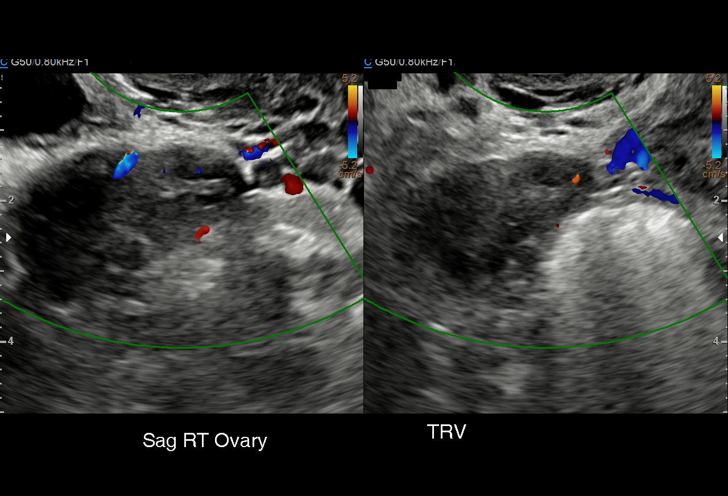
[im 34/38]
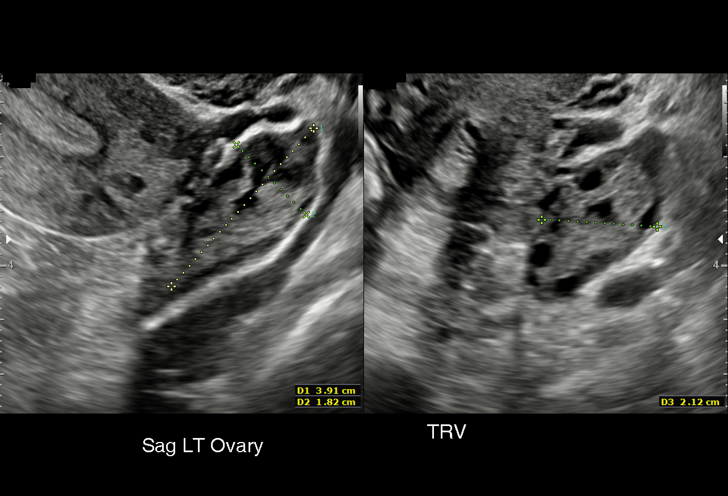
[im 38/38]
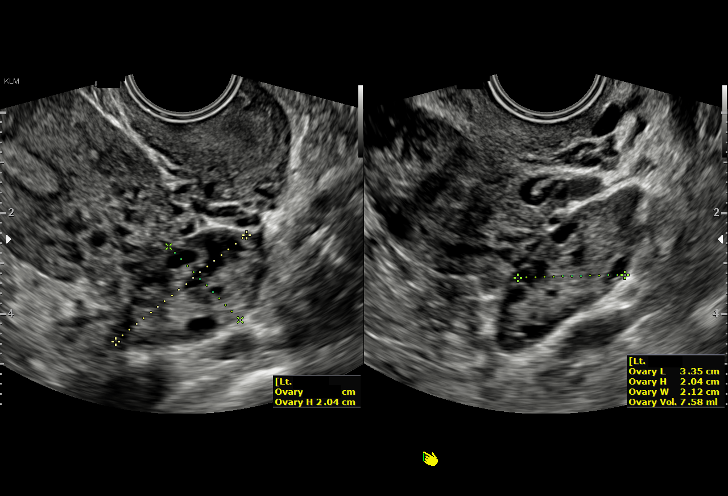

[15 of 25 positions shown; findings below may reference images not displayed]

FINDINGS: Uterus

Measurements: 8.0 x 3.8 x 4.4 cm = volume: 70 mL. Anteverted. Normal
morphology without mass

Endometrium

Thickness: 8 mm.  No endometrial fluid or focal abnormality

Right ovary

Measurements: 2.8 x 1.7 x 1.9 cm = volume: 4.7 mL. Normal morphology
without mass

Left ovary

Measurements: 3.4 x 2.0 x 2.1 cm = volume: 7.6 mL. Normal morphology
without mass

Other findings

No free pelvic fluid or adnexal masses.
IMPRESSION: Normal exam.

## 2020-06-08 IMAGING — US US ABDOMEN LIMITED
1 series · 14 of 25 positions shown · non-contrast
Comparison: None.

CLINICAL DATA: Right upper quadrant pain

EXAM:
ULTRASOUND ABDOMEN LIMITED RIGHT UPPER QUADRANT

[Series 1: us abdomen limited · 14 of 58 slices shown]
[im 1/58]
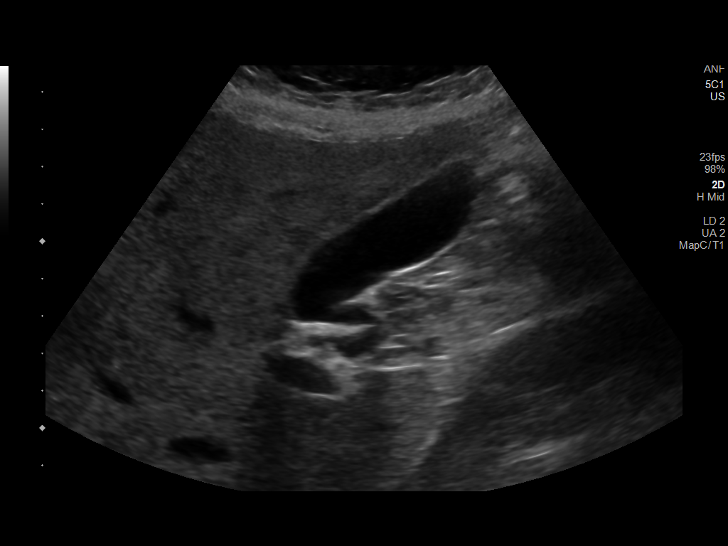
[im 5/58]
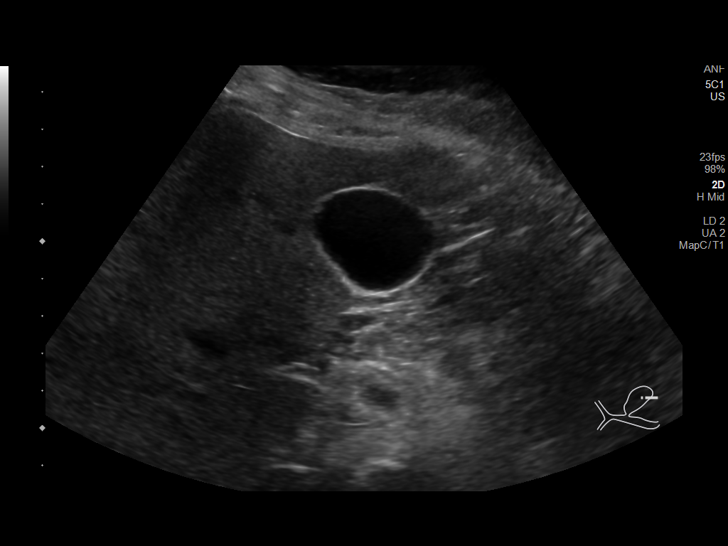
[im 10/58]
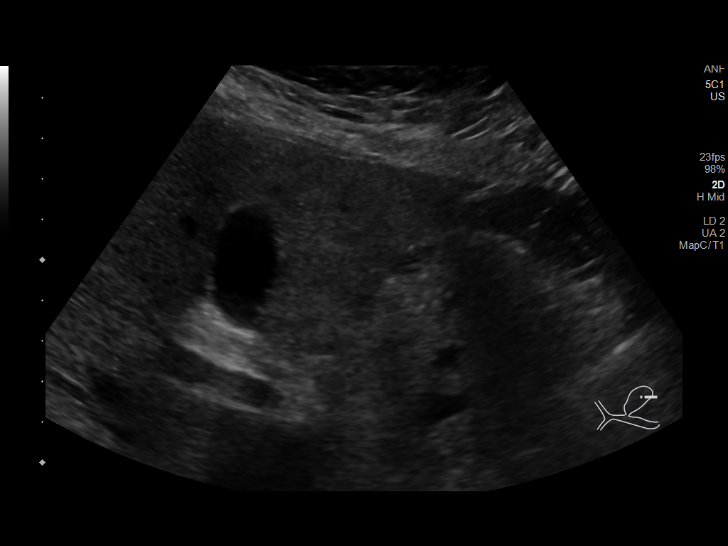
[im 15/58]
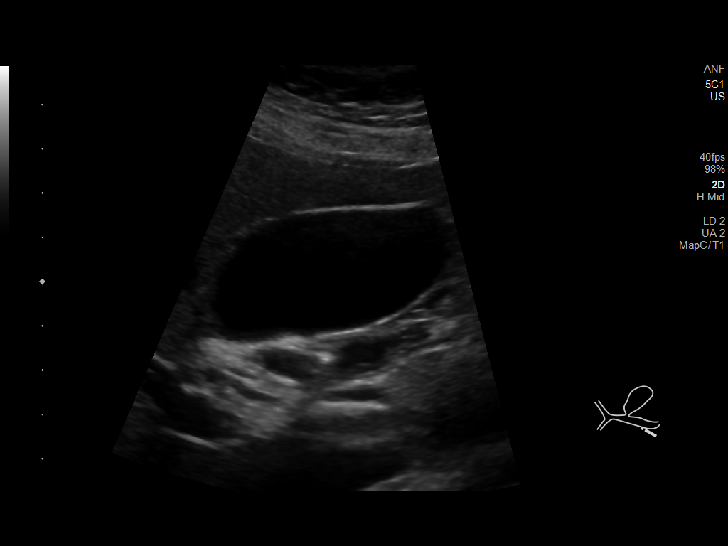
[im 20/58]
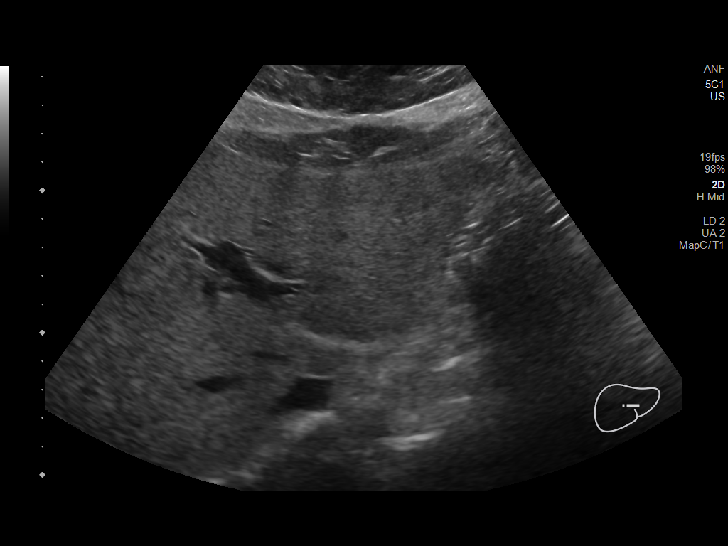
[im 22/58]
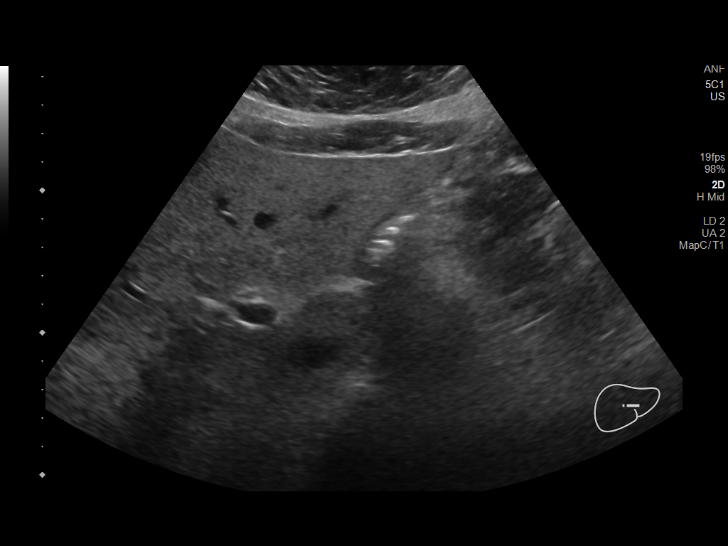
[im 27/58]
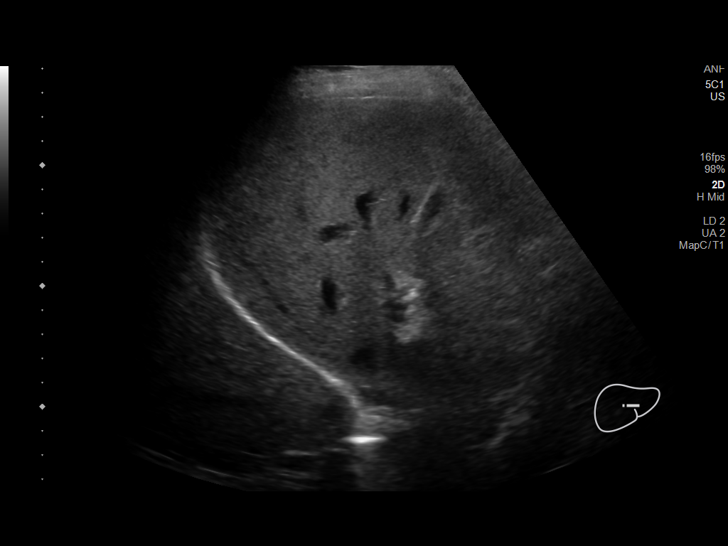
[im 31/58]
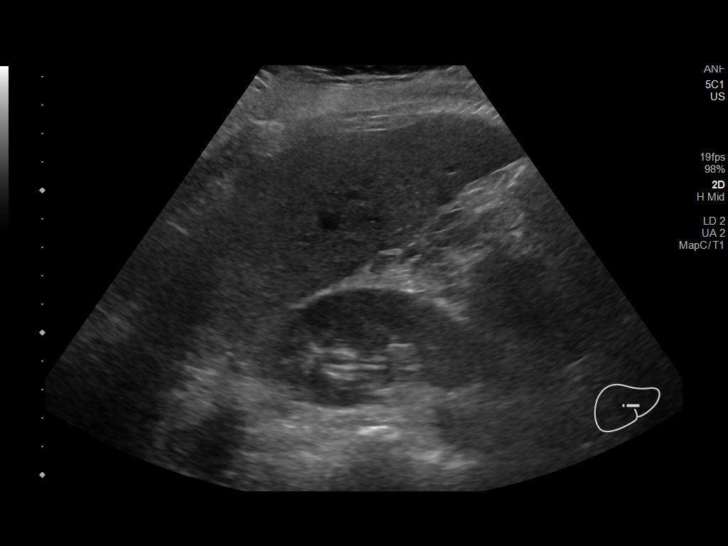
[im 36/58]
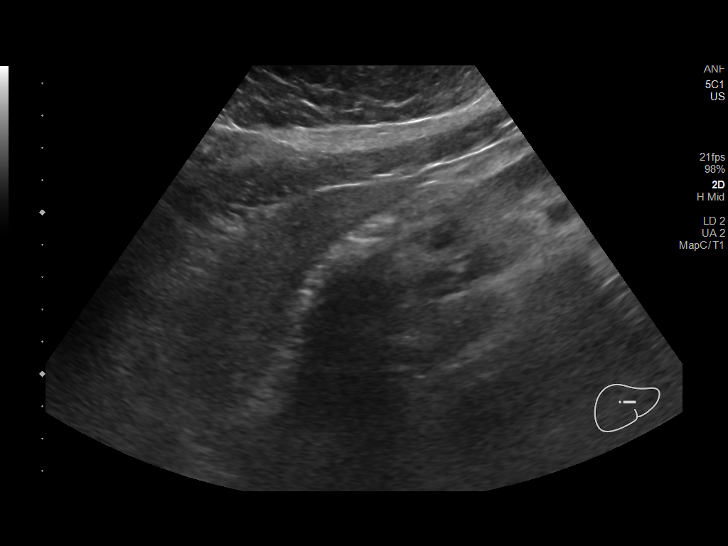
[im 39/58]
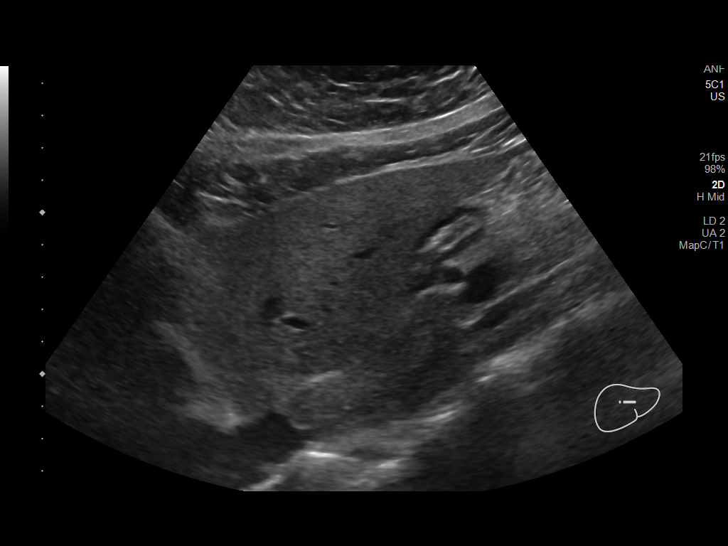
[im 43/58]
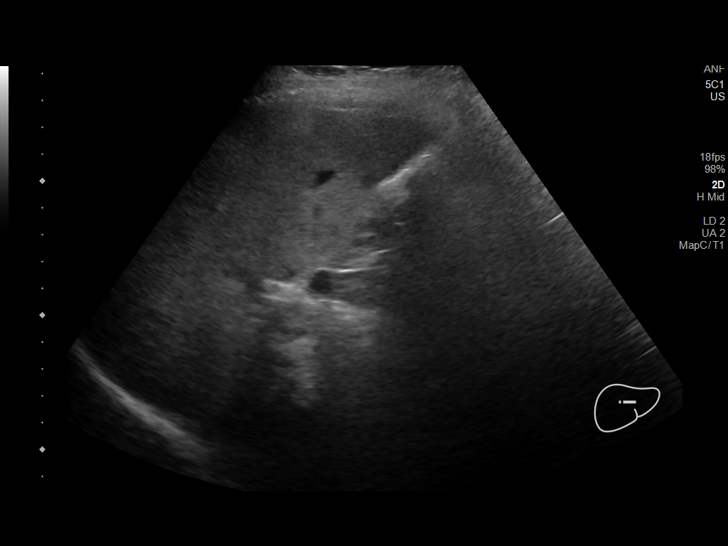
[im 48/58]
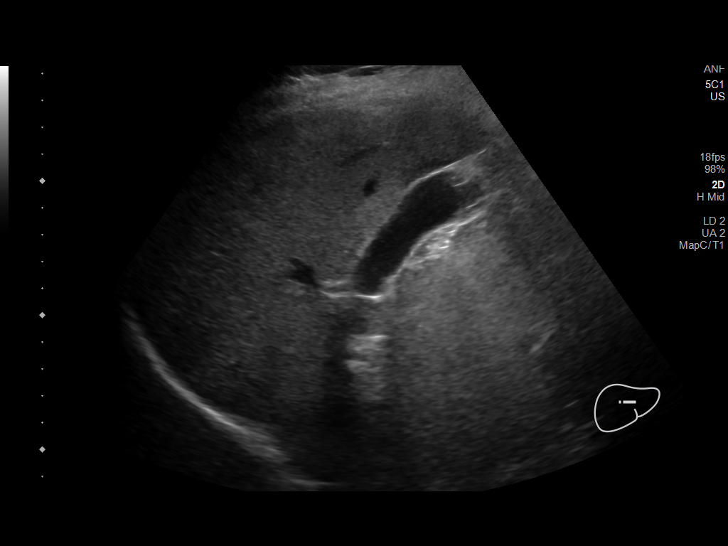
[im 53/58]
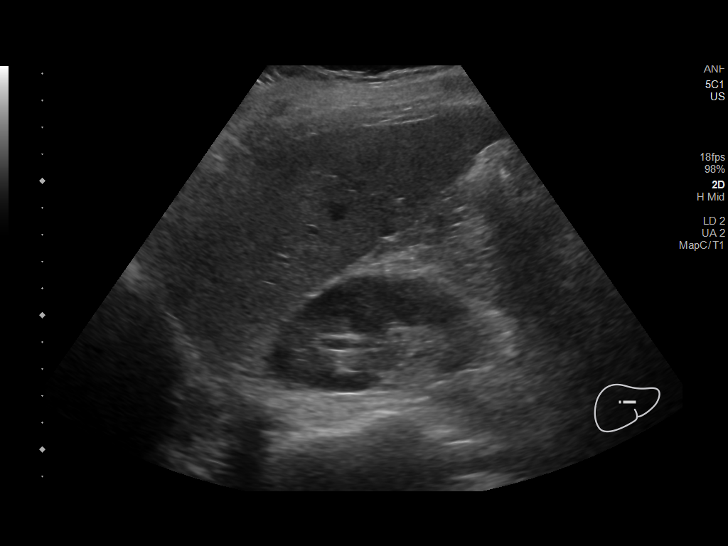
[im 58/58]
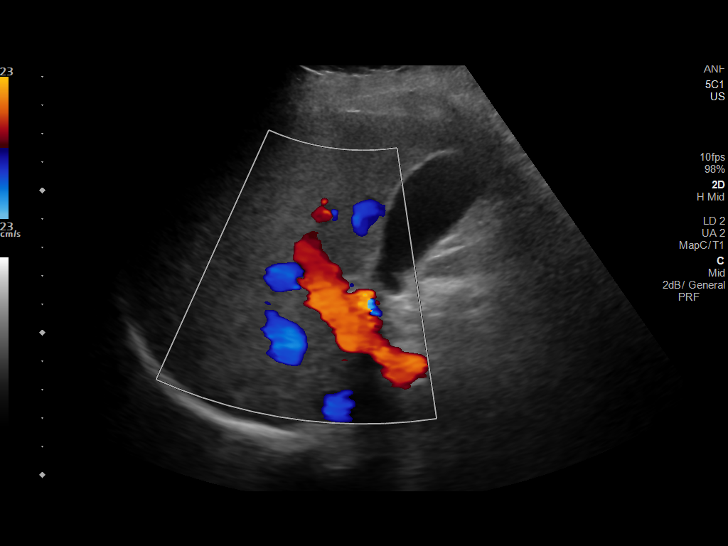

[14 of 25 positions shown; findings below may reference images not displayed]

FINDINGS: Gallbladder:

No gallstones or wall thickening visualized. No sonographic Murphy
sign noted by sonographer.

Common bile duct:

Diameter: 2.3 mm

Liver:

No focal lesion identified. Increased hepatic parenchymal
echogenicity. Portal vein is patent on color Doppler imaging with
normal direction of blood flow towards the liver.

Other: None.
IMPRESSION: 1. No cholelithiasis or sonographic evidence of acute cholecystitis.
2. Increased hepatic echogenicity as can be seen with hepatic
steatosis.

## 2020-08-26 DIAGNOSIS — N911 Secondary amenorrhea: Secondary | ICD-10-CM | POA: Diagnosis not present

## 2020-09-07 DIAGNOSIS — Z3481 Encounter for supervision of other normal pregnancy, first trimester: Secondary | ICD-10-CM | POA: Diagnosis not present

## 2020-09-07 DIAGNOSIS — Z3A08 8 weeks gestation of pregnancy: Secondary | ICD-10-CM | POA: Diagnosis not present

## 2020-09-07 LAB — OB RESULTS CONSOLE ABO/RH: RH Type: POSITIVE

## 2020-09-07 LAB — OB RESULTS CONSOLE HIV ANTIBODY (ROUTINE TESTING): HIV: NONREACTIVE

## 2020-09-07 LAB — OB RESULTS CONSOLE GC/CHLAMYDIA
Chlamydia: NEGATIVE
Gonorrhea: NEGATIVE

## 2020-09-07 LAB — HEPATITIS C ANTIBODY: HCV Ab: NEGATIVE

## 2020-09-07 LAB — OB RESULTS CONSOLE HEPATITIS B SURFACE ANTIGEN: Hepatitis B Surface Ag: NEGATIVE

## 2020-09-07 LAB — OB RESULTS CONSOLE ANTIBODY SCREEN: Antibody Screen: NEGATIVE

## 2020-09-07 LAB — OB RESULTS CONSOLE RPR: RPR: NONREACTIVE

## 2020-09-07 LAB — OB RESULTS CONSOLE RUBELLA ANTIBODY, IGM: Rubella: IMMUNE

## 2020-09-16 DIAGNOSIS — Z3481 Encounter for supervision of other normal pregnancy, first trimester: Secondary | ICD-10-CM | POA: Diagnosis not present

## 2020-09-16 DIAGNOSIS — Z3A10 10 weeks gestation of pregnancy: Secondary | ICD-10-CM | POA: Diagnosis not present

## 2020-09-16 DIAGNOSIS — Z113 Encounter for screening for infections with a predominantly sexual mode of transmission: Secondary | ICD-10-CM | POA: Diagnosis not present

## 2020-10-07 DIAGNOSIS — Z3A13 13 weeks gestation of pregnancy: Secondary | ICD-10-CM | POA: Diagnosis not present

## 2020-10-07 DIAGNOSIS — Z3682 Encounter for antenatal screening for nuchal translucency: Secondary | ICD-10-CM | POA: Diagnosis not present

## 2020-11-23 DIAGNOSIS — Z3A19 19 weeks gestation of pregnancy: Secondary | ICD-10-CM | POA: Diagnosis not present

## 2020-11-23 DIAGNOSIS — Z363 Encounter for antenatal screening for malformations: Secondary | ICD-10-CM | POA: Diagnosis not present

## 2020-12-21 DIAGNOSIS — Z362 Encounter for other antenatal screening follow-up: Secondary | ICD-10-CM | POA: Diagnosis not present

## 2020-12-21 DIAGNOSIS — Z3A23 23 weeks gestation of pregnancy: Secondary | ICD-10-CM | POA: Diagnosis not present

## 2020-12-23 DIAGNOSIS — Q211 Atrial septal defect: Secondary | ICD-10-CM | POA: Diagnosis not present

## 2021-01-20 DIAGNOSIS — Z23 Encounter for immunization: Secondary | ICD-10-CM | POA: Diagnosis not present

## 2021-01-29 ENCOUNTER — Other Ambulatory Visit: Payer: Self-pay

## 2021-01-29 ENCOUNTER — Inpatient Hospital Stay (HOSPITAL_COMMUNITY)
Admission: AD | Admit: 2021-01-29 | Discharge: 2021-01-29 | Disposition: A | Discharge status: Home / Self Care | Payer: BC Managed Care – PPO | Attending: Obstetrics and Gynecology | Admitting: Obstetrics and Gynecology

## 2021-01-29 ENCOUNTER — Encounter (HOSPITAL_COMMUNITY): Payer: Self-pay | Admitting: Obstetrics and Gynecology

## 2021-01-29 DIAGNOSIS — O99613 Diseases of the digestive system complicating pregnancy, third trimester: Secondary | ICD-10-CM | POA: Diagnosis not present

## 2021-01-29 DIAGNOSIS — Z880 Allergy status to penicillin: Secondary | ICD-10-CM | POA: Insufficient documentation

## 2021-01-29 DIAGNOSIS — Z3A29 29 weeks gestation of pregnancy: Secondary | ICD-10-CM

## 2021-01-29 DIAGNOSIS — Z3689 Encounter for other specified antenatal screening: Secondary | ICD-10-CM

## 2021-01-29 DIAGNOSIS — K529 Noninfective gastroenteritis and colitis, unspecified: Secondary | ICD-10-CM | POA: Diagnosis not present

## 2021-01-29 DIAGNOSIS — R197 Diarrhea, unspecified: Secondary | ICD-10-CM | POA: Diagnosis not present

## 2021-01-29 LAB — URINALYSIS, ROUTINE W REFLEX MICROSCOPIC
Bilirubin Urine: NEGATIVE
Glucose, UA: NEGATIVE mg/dL
Hgb urine dipstick: NEGATIVE
Ketones, ur: NEGATIVE mg/dL
Nitrite: NEGATIVE
Protein, ur: NEGATIVE mg/dL
Specific Gravity, Urine: 1.011 (ref 1.005–1.030)
pH: 5 (ref 5.0–8.0)

## 2021-01-29 LAB — CBC WITH DIFFERENTIAL/PLATELET
Abs Immature Granulocytes: 0.06 10*3/uL (ref 0.00–0.07)
Basophils Absolute: 0 10*3/uL (ref 0.0–0.1)
Basophils Relative: 0 %
Eosinophils Absolute: 0.1 10*3/uL (ref 0.0–0.5)
Eosinophils Relative: 1 %
HCT: 34.7 % — ABNORMAL LOW (ref 36.0–46.0)
Hemoglobin: 11 g/dL — ABNORMAL LOW (ref 12.0–15.0)
Immature Granulocytes: 1 %
Lymphocytes Relative: 16 %
Lymphs Abs: 1.6 10*3/uL (ref 0.7–4.0)
MCH: 28.1 pg (ref 26.0–34.0)
MCHC: 31.7 g/dL (ref 30.0–36.0)
MCV: 88.7 fL (ref 80.0–100.0)
Monocytes Absolute: 0.6 10*3/uL (ref 0.1–1.0)
Monocytes Relative: 6 %
Neutro Abs: 7.5 10*3/uL (ref 1.7–7.7)
Neutrophils Relative %: 76 %
Platelets: 295 10*3/uL (ref 150–400)
RBC: 3.91 MIL/uL (ref 3.87–5.11)
RDW: 13.7 % (ref 11.5–15.5)
WBC: 9.9 10*3/uL (ref 4.0–10.5)
nRBC: 0 % (ref 0.0–0.2)

## 2021-01-29 LAB — LIPASE, BLOOD: Lipase: 35 U/L (ref 11–51)

## 2021-01-29 LAB — COMPREHENSIVE METABOLIC PANEL
ALT: 10 U/L (ref 0–44)
AST: 12 U/L — ABNORMAL LOW (ref 15–41)
Albumin: 2.6 g/dL — ABNORMAL LOW (ref 3.5–5.0)
Alkaline Phosphatase: 91 U/L (ref 38–126)
Anion gap: 8 (ref 5–15)
BUN: 5 mg/dL — ABNORMAL LOW (ref 6–20)
CO2: 21 mmol/L — ABNORMAL LOW (ref 22–32)
Calcium: 8.5 mg/dL — ABNORMAL LOW (ref 8.9–10.3)
Chloride: 105 mmol/L (ref 98–111)
Creatinine, Ser: 0.43 mg/dL — ABNORMAL LOW (ref 0.44–1.00)
GFR, Estimated: >60 mL/min (ref >60)
Glucose, Bld: 81 mg/dL (ref 70–99)
Potassium: 4.2 mmol/L (ref 3.5–5.1)
Sodium: 134 mmol/L — ABNORMAL LOW (ref 135–145)
Total Bilirubin: 0.4 mg/dL (ref 0.3–1.2)
Total Protein: 6.8 g/dL (ref 6.5–8.1)

## 2021-01-29 MED ORDER — SIMETHICONE 80 MG PO CHEW
80.0000 mg | CHEWABLE_TABLET | Freq: Once | ORAL | Status: AC
  Administered 2021-01-29: 80 mg via ORAL
  Filled 2021-01-29: qty 1

## 2021-01-29 MED ORDER — ONDANSETRON HCL 4 MG/2ML IJ SOLN
4.0000 mg | Freq: Once | INTRAMUSCULAR | Status: AC
  Administered 2021-01-29: 4 mg via INTRAVENOUS
  Filled 2021-01-29: qty 2

## 2021-01-29 MED ORDER — FAMOTIDINE IN NACL 20-0.9 MG/50ML-% IV SOLN
20.0000 mg | Freq: Once | INTRAVENOUS | Status: AC
  Administered 2021-01-29: 20 mg via INTRAVENOUS
  Filled 2021-01-29: qty 50

## 2021-01-29 MED ORDER — LACTATED RINGERS IV BOLUS
1000.0000 mL | Freq: Once | INTRAVENOUS | Status: AC
  Administered 2021-01-29: 1000 mL via INTRAVENOUS

## 2021-01-29 NOTE — MAU Note (Signed)
Pt reports to mau with c/o upper abd pain that is stabbing and constant.  Pt also reports diarrhea for the past 2 days.  Reports 4 episodes in the last 24 hrs.  Denies vag bleeding or LOF.  +FM

## 2021-01-29 NOTE — MAU Provider Note (Signed)
Chief Complaint:  Abdominal Pain and Diarrhea   Event Date/Time   First Provider Initiated Contact with Patient 01/29/21 1028     HPI: Helen White is a 25 y.o. [id number] at [redacted]w[redacted]d who presents to maternity admissions reporting diarrhea for the past 48hrs after eating bagged salad. Also reports nausea (no vomiting), epigastric pain and painful gas. Feeling weak and dehydrated because she hasn't eaten much. Has sipped water today but also had 4 bouts of diarrhea since this morning. Denies vaginal bleeding, leaking of fluid, decreased fetal movement, fever, falls, or recent illness.   Pregnancy Course: Receives care at [facility]  Past Medical History:  Diagnosis Date   Migraine    OB History  Gravida Para Term Preterm AB Living  3 2 2     2   SAB IAB Ectopic Multiple Live Births        0 2    # Outcome Date GA Lbr Len/2nd Weight Sex Delivery Anes PTL Lv  3 Current           2 Term 01/08/20 [redacted]w[redacted]d 273:04 / 01:15 7 lb 12.2 oz (3.521 kg) F Vag-Spont EPI  LIV  1 Term 10/08/17 [redacted]w[redacted]d 24:21 / 04:25 7 lb 14.1 oz (3.575 kg) F Vag-Spont EPI, Local  LIV   Past Surgical History:  Procedure Laterality Date   NO PAST SURGERIES     Family History  Problem Relation Age of Onset   Healthy Mother    Healthy Father    Social History   Tobacco Use   Smoking status: Never   Smokeless tobacco: Never  Vaping Use   Vaping Use: Never used  Substance Use Topics   Alcohol use: No   Drug use: No   Allergies  Allergen Reactions   Penicillins     Has patient had a PCN reaction causing immediate rash, facial/tongue/throat swelling, SOB or lightheadedness with hypotension: No Has patient had a PCN reaction causing severe rash involving mucus membranes or skin necrosis: No Has patient had a PCN reaction that required hospitalization: No Has patient had a PCN reaction occurring within the last 10 years: No If all of the above answers are "NO", then may proceed with Cephalosporin use.     Medications Prior to Admission  Medication Sig Dispense Refill Last Dose   Prenatal Vit-Fe Fumarate-FA (PRENATAL MULTIVITAMIN) TABS tablet Take 1 tablet by mouth at bedtime.       I have reviewed patient's Past Medical Hx, Surgical Hx, Family Hx, Social Hx, medications and allergies.   ROS:  Pertinent items noted in HPI and remainder of comprehensive ROS otherwise negative.   Physical Exam  Patient Vitals for the past 24 hrs:  BP Temp Temp src Pulse Resp SpO2  01/29/21 1225 -- -- -- -- -- 100 %  01/29/21 1220 -- -- -- -- -- 99 %  01/29/21 1215 -- -- -- -- -- 100 %  01/29/21 1210 -- -- -- -- -- 100 %  01/29/21 1205 -- -- -- -- -- 100 %  01/29/21 1155 -- -- -- -- -- 99 %  01/29/21 1150 -- -- -- -- -- 100 %  01/29/21 1145 -- -- -- -- -- 99 %  01/29/21 1140 -- -- -- -- -- 100 %  01/29/21 1135 -- -- -- -- -- 100 %  01/29/21 1130 -- -- -- -- -- 99 %  01/29/21 1120 -- -- -- -- -- 100 %  01/29/21 1115 -- -- -- -- -- 100 %  01/29/21 1110 -- -- -- -- -- 100 %  01/29/21 1105 -- -- -- -- -- 100 %  01/29/21 1100 -- -- -- -- -- 100 %  01/29/21 1055 -- -- -- -- -- 100 %  01/29/21 1050 -- -- -- -- -- 100 %  01/29/21 1045 -- -- -- -- -- 100 %  01/29/21 1040 -- -- -- -- -- 100 %  01/29/21 1035 -- -- -- -- -- 100 %  01/29/21 1030 -- -- -- -- -- 100 %  01/29/21 1025 -- -- -- -- -- 100 %  01/29/21 1020 -- -- -- -- -- 100 %  01/29/21 1015 -- -- -- -- -- 100 %  01/29/21 1010 -- -- -- -- -- 100 %  01/29/21 1005 (!) 99/56 -- -- 99 -- 100 %  01/29/21 0945 113/61 97.6 F (36.4 C) Oral (!) 117 17 100 %   Constitutional: Well-developed, well-nourished female in no acute distress.  Cardiovascular: normal rhythm with occasional mild transient tachycardia, no murmur Respiratory: normal effort, lung sounds clear throughout GI: Abd soft, non-tender, gravid appropriate for gestational age. Pos BS x 4 MS: Extremities nontender, no edema, normal ROM Neurologic: Alert and oriented x 4.  GU: no CVA  tenderness Pelvic: exam deferred  Fetal Tracing: reactive Baseline: 130 Variability: moderate Accelerations: 15x15 Decelerations: occasional variable, appropriate for gestational age Toco: UI   Labs: Results for orders placed or performed during the hospital encounter of 01/29/21 (from the past 24 hour(s))  Urinalysis, Routine w reflex microscopic Urine, Clean Catch     Status: Abnormal   Collection Time: 01/29/21  9:49 AM  Result Value Ref Range   Color, Urine YELLOW YELLOW   APPearance CLOUDY (A) CLEAR   Specific Gravity, Urine 1.011 1.005 - 1.030   pH 5.0 5.0 - 8.0   Glucose, UA NEGATIVE NEGATIVE mg/dL   Hgb urine dipstick NEGATIVE NEGATIVE   Bilirubin Urine NEGATIVE NEGATIVE   Ketones, ur NEGATIVE NEGATIVE mg/dL   Protein, ur NEGATIVE NEGATIVE mg/dL   Nitrite NEGATIVE NEGATIVE   Leukocytes,Ua SMALL (A) NEGATIVE   RBC / HPF 0-5 0 - 5 RBC/hpf   WBC, UA 0-5 0 - 5 WBC/hpf   Bacteria, UA RARE (A) NONE SEEN   Squamous Epithelial / LPF 11-20 0 - 5   Mucus PRESENT   CBC with Differential/Platelet     Status: Abnormal   Collection Time: 01/29/21 10:40 AM  Result Value Ref Range   WBC 9.9 4.0 - 10.5 K/uL   RBC 3.91 3.87 - 5.11 MIL/uL   Hemoglobin 11.0 (L) 12.0 - 15.0 g/dL   HCT 82.4 (L) 23.5 - 36.1 %   MCV 88.7 80.0 - 100.0 fL   MCH 28.1 26.0 - 34.0 pg   MCHC 31.7 30.0 - 36.0 g/dL   RDW 44.3 15.4 - 00.8 %   Platelets 295 150 - 400 K/uL   nRBC 0.0 0.0 - 0.2 %   Neutrophils Relative % 76 %   Neutro Abs 7.5 1.7 - 7.7 K/uL   Lymphocytes Relative 16 %   Lymphs Abs 1.6 0.7 - 4.0 K/uL   Monocytes Relative 6 %   Monocytes Absolute 0.6 0.1 - 1.0 K/uL   Eosinophils Relative 1 %   Eosinophils Absolute 0.1 0.0 - 0.5 K/uL   Basophils Relative 0 %   Basophils Absolute 0.0 0.0 - 0.1 K/uL   Immature Granulocytes 1 %   Abs Immature Granulocytes 0.06 0.00 - 0.07 K/uL  Comprehensive metabolic panel  Status: Abnormal   Collection Time: 01/29/21 10:40 AM  Result Value Ref Range    Sodium 134 (L) 135 - 145 mmol/L   Potassium 4.2 3.5 - 5.1 mmol/L   Chloride 105 98 - 111 mmol/L   CO2 21 (L) 22 - 32 mmol/L   Glucose, Bld 81 70 - 99 mg/dL   BUN 5 (L) 6 - 20 mg/dL   Creatinine, Ser 4.62 (L) 0.44 - 1.00 mg/dL   Calcium 8.5 (L) 8.9 - 10.3 mg/dL   Total Protein 6.8 6.5 - 8.1 g/dL   Albumin 2.6 (L) 3.5 - 5.0 g/dL   AST 12 (L) 15 - 41 U/L   ALT 10 0 - 44 U/L   Alkaline Phosphatase 91 38 - 126 U/L   Total Bilirubin 0.4 0.3 - 1.2 mg/dL   GFR, Estimated >70 >35 mL/min   Anion gap 8 5 - 15  Lipase, blood     Status: None   Collection Time: 01/29/21 10:40 AM  Result Value Ref Range   Lipase 35 11 - 51 U/L   Imaging:  No results found.  MAU Course: Orders Placed This Encounter  Procedures   Urinalysis, Routine w reflex microscopic Urine, Clean Catch   CBC with Differential/Platelet   Comprehensive metabolic panel   Lipase, blood   Discharge patient   Meds ordered this encounter  Medications   lactated ringers bolus 1,000 mL   ondansetron (ZOFRAN) injection 4 mg   famotidine (PEPCID) IVPB 20 mg premix   simethicone (MYLICON) chewable tablet 80 mg   MDM: LR bolus plus pepcid and zofran given with good relief of some symptoms, still feeling the epigastric pain and bloating. Simethicone ordered/given and patient expressed relief of symptoms.  Educated about sources of foodborne illness that are higher risk for pregnant people. Management of diarrhea instructions given in AVS.  Assessment: 1. Gastroenteritis   2. NST (non-stress test) reactive    Plan: Discharge home in stable condition with return precautions.     Follow-up Information     [place] Follow up.   Why: as scheduled for ongoing prenatal care Contact information: [address] [place] [address] [phone]                 Allergies as of 01/29/2021       Reactions   Penicillins    Has patient had a PCN reaction causing immediate rash,  facial/tongue/throat swelling, SOB or lightheadedness with hypotension: No Has patient had a PCN reaction causing severe rash involving mucus membranes or skin necrosis: No Has patient had a PCN reaction that required hospitalization: No Has patient had a PCN reaction occurring within the last 10 years: No If all of the above answers are "NO", then may proceed with Cephalosporin use.        Medication List     TAKE these medications    prenatal multivitamin Tabs tablet Take 1 tablet by mouth at bedtime.       Edd Arbour, CNM, MSN, IBCLC Certified Nurse Midwife, [facility]

## 2021-03-07 ENCOUNTER — Ambulatory Visit (INDEPENDENT_AMBULATORY_CARE_PROVIDER_SITE_OTHER)
Admission: EM | Admit: 2021-03-07 | Discharge: 2021-03-07 | Disposition: A | Discharge status: Other Acute Inpatient Hospital | Payer: BC Managed Care – PPO | Source: Home / Self Care

## 2021-03-07 ENCOUNTER — Other Ambulatory Visit: Payer: Self-pay

## 2021-03-07 ENCOUNTER — Encounter: Payer: Self-pay | Admitting: Emergency Medicine

## 2021-03-07 ENCOUNTER — Inpatient Hospital Stay (HOSPITAL_COMMUNITY)
Admission: AD | Admit: 2021-03-07 | Discharge: 2021-03-07 | Disposition: A | Discharge status: Home / Self Care | Payer: BC Managed Care – PPO | Attending: Obstetrics and Gynecology | Admitting: Obstetrics and Gynecology

## 2021-03-07 ENCOUNTER — Encounter (HOSPITAL_COMMUNITY): Payer: Self-pay | Admitting: Obstetrics and Gynecology

## 2021-03-07 DIAGNOSIS — O2653 Maternal hypotension syndrome, third trimester: Secondary | ICD-10-CM | POA: Insufficient documentation

## 2021-03-07 DIAGNOSIS — R000 Tachycardia, unspecified: Secondary | ICD-10-CM | POA: Insufficient documentation

## 2021-03-07 DIAGNOSIS — J069 Acute upper respiratory infection, unspecified: Secondary | ICD-10-CM

## 2021-03-07 DIAGNOSIS — Z3A34 34 weeks gestation of pregnancy: Secondary | ICD-10-CM | POA: Diagnosis not present

## 2021-03-07 DIAGNOSIS — R031 Nonspecific low blood-pressure reading: Secondary | ICD-10-CM | POA: Insufficient documentation

## 2021-03-07 DIAGNOSIS — O99513 Diseases of the respiratory system complicating pregnancy, third trimester: Secondary | ICD-10-CM | POA: Insufficient documentation

## 2021-03-07 DIAGNOSIS — H65191 Other acute nonsuppurative otitis media, right ear: Secondary | ICD-10-CM | POA: Insufficient documentation

## 2021-03-07 DIAGNOSIS — J988 Other specified respiratory disorders: Secondary | ICD-10-CM | POA: Diagnosis not present

## 2021-03-07 DIAGNOSIS — Z20822 Contact with and (suspected) exposure to covid-19: Secondary | ICD-10-CM | POA: Insufficient documentation

## 2021-03-07 DIAGNOSIS — J029 Acute pharyngitis, unspecified: Secondary | ICD-10-CM | POA: Diagnosis not present

## 2021-03-07 LAB — POCT RAPID STREP A (OFFICE): Rapid Strep A Screen: NEGATIVE

## 2021-03-07 MED ORDER — AZITHROMYCIN 500 MG PO TABS: ORAL_TABLET | ORAL | 0 refills | Status: AC

## 2021-03-07 NOTE — Discharge Instructions (Addendum)
Safe Medications in Pregnancy   Acne: Benzoyl Peroxide Salicylic Acid  Backache/Headache: Tylenol: 2 regular strength every 4 hours OR              2 Extra strength every 6 hours  Colds/Coughs/Allergies: Benadryl (alcohol free) 25 mg every 6 hours as needed Breath right strips Claritin Cepacol throat lozenges Chloraseptic throat spray Cold-Eeze- up to three times per day Cough drops, alcohol free Flonase (by prescription only) Guaifenesin Mucinex Robitussin DM (plain only, alcohol free) Saline nasal spray/drops Sudafed (pseudoephedrine) & Actifed ** use only after [redacted] weeks gestation and if you do not have high blood pressure Tylenol Vicks Vaporub Zinc lozenges Zyrtec   Constipation: Colace Ducolax suppositories Fleet enema Glycerin suppositories Metamucil Milk of magnesia Miralax Senokot Smooth move tea  Diarrhea: Kaopectate Imodium A-D  *NO pepto Bismol  Hemorrhoids: Anusol Anusol HC Preparation H Tucks  Indigestion: Tums Maalox Mylanta Zantac  Pepcid  Insomnia: Benadryl (alcohol free) 25mg  every 6 hours as needed Tylenol PM Unisom, no Gelcaps  Leg Cramps: Tums MagGel  Nausea/Vomiting:  Bonine Dramamine Emetrol Ginger extract Sea bands Meclizine  Nausea medication to take during pregnancy:  Unisom (doxylamine succinate 25 mg tablets) Take one tablet daily at bedtime. If symptoms are not adequately controlled, the dose can be increased to a maximum recommended dose of two tablets daily (1/2 tablet in the morning, 1/2 tablet mid-afternoon and one at bedtime). Vitamin B6 100mg  tablets. Take one tablet twice a day (up to 200 mg per day).  Skin Rashes: Aveeno products Benadryl cream or 25mg  every 6 hours as needed Calamine Lotion 1% cortisone cream  Yeast infection: Gyne-lotrimin 7 Monistat 7   **If taking multiple medications, please check labels to avoid duplicating the same active ingredients **take medication as directed on  the label ** Do not exceed 4000 mg of tylenol in 24 hours **Do not take medications that contain aspirin or ibuprofen   STAY WELL HYDRATED

## 2021-03-07 NOTE — MAU Note (Signed)
..  Helen White is a 25 y.o. at [redacted]w[redacted]d here in MAU reporting: She was at urgent care for sore throat, congestion, and cough; they ruled out strep throat but diagnosed her with an ear infection and has a COVID/flu test pending. She was sent here because her blood pressure was 88/60 and her heart rate was elevation while she was at urgent care.   +FM. Denies vaginal bleeding or LOF. Reports some braxton contractions.   Vitals:   03/07/21 2058  BP: 112/71  Pulse: (!) 126  Resp: 20  Temp: 98.3 F (36.8 C)  SpO2: 99%

## 2021-03-07 NOTE — Discharge Instructions (Addendum)
Please go to the hospital as soon as you leave urgent care for further evaluation and management due to your low blood pressure and high heart rate.  Your medication for ear infection is waiting at the pharmacy as long as no abnormalities are found at ER.

## 2021-03-07 NOTE — ED Provider Notes (Signed)
[facility]    CSN: [id number] Arrival date & time: 03/07/21  1459      History   Chief Complaint Chief Complaint  Patient presents with   Sore Throat    HPI Helen White is a 25 y.o. female.   Patient presents to urgent care for sore throat, nasal congestion, fever that started approximately 3 days ago.  Patient reports that her throat feels like she is "swallowing glass".  Denies any known sick contacts.  T-max at home was 103 approximately 2 days ago.  Patient has also had some intermittent dizziness at times.  Patient is currently [redacted] weeks pregnant.  Denies chest pain, shortness of breath, nausea, vomiting, abdominal pain, diarrhea.   Sore Throat   Past Medical History:  Diagnosis Date   Migraine     Patient Active Problem List   Diagnosis Date Noted   Encounter for elective induction of labor 01/08/2020    Past Surgical History:  Procedure Laterality Date   NO PAST SURGERIES      OB History     Gravida  3   Para  2   Term  2   Preterm      AB      Living  2      SAB      IAB      Ectopic      Multiple  0   Live Births  2            Home Medications    Prior to Admission medications   Medication Sig Start Date End Date Taking? Authorizing Provider  azithromycin (ZITHROMAX) 500 MG tablet Take 1 tablet (500 mg total) by mouth daily for 1 day, THEN 0.5 tablets (250 mg total) daily for 4 days. 03/07/21 03/12/21 Yes Cameryn Schum, Acie Fredrickson, FNP  Prenatal Vit-Fe Fumarate-FA (PRENATAL MULTIVITAMIN) TABS tablet Take 1 tablet by mouth at bedtime.     [provider]  ipratropium (ATROVENT) 0.06 % nasal spray Place 2 sprays into both nostrils 4 (four) times daily. 03/17/18 01/03/19  Belinda Fisher, PA-C    Family History Family History  Problem Relation Age of Onset   Healthy Mother    Healthy Father     Social History Social History   Tobacco Use   Smoking status: Never   Smokeless tobacco: Never  Vaping Use    Vaping Use: Never used  Substance Use Topics   Alcohol use: No   Drug use: No     Allergies   Penicillins   Review of Systems Review of Systems Per HPI  Physical Exam Triage Vital Signs ED Triage Vitals [03/07/21 1639]  Enc Vitals Group     BP 112/71     Pulse Rate (!) 128     Resp 18     Temp 97.9 F (36.6 C)     Temp Source Oral     SpO2 97 %     Weight      Height      Head Circumference      Peak Flow      Pain Score 6     Pain Loc      Pain Edu?      Excl. in GC?    No data found.  Updated Vital Signs BP (!) 88/60 (BP Location: Left Arm)   Pulse (!) 115   Temp 98 F (36.7 C) (Oral)   Resp 18   SpO2 96%   Visual Acuity  Right Eye Distance:   Left Eye Distance:   Bilateral Distance:    Right Eye Near:   Left Eye Near:    Bilateral Near:     Physical Exam Constitutional:      General: She is not in acute distress.    Appearance: Normal appearance. She is not toxic-appearing or diaphoretic.  HENT:     Head: Normocephalic and atraumatic.     Right Ear: Ear canal normal. Tympanic membrane is erythematous. Tympanic membrane is not perforated or bulging.     Left Ear: Tympanic membrane and ear canal normal.     Nose: Congestion present.     Mouth/Throat:     Pharynx: Posterior oropharyngeal erythema present.  Eyes:     Extraocular Movements: Extraocular movements intact.     Conjunctiva/sclera: Conjunctivae normal.     Pupils: Pupils are equal, round, and reactive to light.  Cardiovascular:     Rate and Rhythm: Regular rhythm. Tachycardia present.     Pulses: Normal pulses.     Heart sounds: Normal heart sounds.  Pulmonary:     Effort: Pulmonary effort is normal. No respiratory distress.     Breath sounds: Normal breath sounds. No wheezing or rales.  Musculoskeletal:        General: Normal range of motion.  Skin:    General: Skin is warm and dry.  Neurological:     General: No focal deficit present.     Mental Status: She is alert and  oriented to person, place, and time. Mental status is at baseline.  Psychiatric:        Mood and Affect: Mood normal.        Behavior: Behavior normal.        Thought Content: Thought content normal.        Judgment: Judgment normal.     UC Treatments / Results  Labs (all labs ordered are listed, but only abnormal results are displayed) Labs Reviewed  CULTURE, GROUP A STREP ([facility])  COVID-19, FLU A+B NAA  POCT RAPID STREP A (OFFICE)    EKG   Radiology No results found.  Procedures Procedures (including critical care time)  Medications Ordered in UC Medications - No data to display  Initial Impression / Assessment and Plan / UC Course  I have reviewed the triage vital signs and the nursing notes.  Pertinent labs & imaging results that were available during my care of the patient were reviewed by me and considered in my medical decision making (see chart for details).     Patient symptoms appear viral in etiology.  COVID-19 and flu test are pending.  Rapid strep was negative.  Throat culture is pending.   Will treat right ear infection with azithromycin antibiotic as this is safe with pregnancy and patient has penicillin allergy.  Antibiotic sent prior to recheck of vital signs.  Recheck of vital signs prior to patient discharge showed an abnormally low blood pressure and tachycardia that was persistent since initial triage.  Due to patient having intermittent dizziness with low blood pressure, patient was advised to go to the hospital for further evaluation and management and to check on baby.  Suggested EMS transport due to blood pressure level but patient declined.  Risks associated with not going to the hospital via EMS were discussed with patient.  Husband transported patient to the hospital. Final Clinical Impressions(s) / UC Diagnoses   Final diagnoses:  Sore throat  Viral upper respiratory tract infection with cough  Other non-recurrent  acute nonsuppurative otitis  media of right ear  Low blood pressure reading  Tachycardia     Discharge Instructions      Please go to the hospital as soon as you leave urgent care for further evaluation and management due to your low blood pressure and high heart rate.  Your medication for ear infection is waiting at the pharmacy as long as no abnormalities are found at ER.    ED Prescriptions     Medication Sig Dispense Auth. Provider   azithromycin (ZITHROMAX) 500 MG tablet Take 1 tablet (500 mg total) by mouth daily for 1 day, THEN 0.5 tablets (250 mg total) daily for 4 days. 3 tablet [place], Acie Fredrickson, [place]      PDMP not reviewed this encounter.   Gustavus Bryant, [place] 03/07/21 1758

## 2021-03-07 NOTE — MAU Provider Note (Signed)
History     CSN: [id number]  Arrival date and time: 03/07/21 2037   Event Date/Time   First Provider Initiated Contact with Patient 03/07/21 2116      Chief Complaint  Patient presents with   Hypotension   Tachycardia   Ms. Helen White is a 25 y.o. year old [id number] female at [redacted]w[redacted]d weeks gestation who presents to MAU reporting she was seen this afternoon at Urgent Care for sore throat, cough, congestion for the past few days. While being evaluated at UC her BP was "112 over something; I can't remember what the bottom number was." They took BP again and it was 88/60 with a high heart rate. UC sent her here for further evaluation or the low BP and high HR. She states that UC told her "if your BP stays low and your HR stays high, they may take the baby tonight." UC sent a Rx for Amoxicillin that she has not started taking yet. Her spouse is present and contributing to the history taking.   OB History     Gravida  3   Para  2   Term  2   Preterm      AB      Living  2      SAB      IAB      Ectopic      Multiple  0   Live Births  2           Past Medical History:  Diagnosis Date   Migraine     Past Surgical History:  Procedure Laterality Date   NO PAST SURGERIES      Family History  Problem Relation Age of Onset   Healthy Mother    Healthy Father     Social History   Tobacco Use   Smoking status: Never   Smokeless tobacco: Never  Vaping Use   Vaping Use: Never used  Substance Use Topics   Alcohol use: No   Drug use: No    Allergies:  Allergies  Allergen Reactions   Penicillins     Has patient had a PCN reaction causing immediate rash, facial/tongue/throat swelling, SOB or lightheadedness with hypotension: No Has patient had a PCN reaction causing severe rash involving mucus membranes or skin necrosis: No Has patient had a PCN reaction that required hospitalization: No Has patient had a PCN reaction occurring within the last 10  years: No If all of the above answers are "NO", then may proceed with Cephalosporin use.     Medications Prior to Admission  Medication Sig Dispense Refill Last Dose   Prenatal Vit-Fe Fumarate-FA (PRENATAL MULTIVITAMIN) TABS tablet Take 1 tablet by mouth at bedtime.    03/07/2021   azithromycin (ZITHROMAX) 500 MG tablet Take 1 tablet (500 mg total) by mouth daily for 1 day, THEN 0.5 tablets (250 mg total) daily for 4 days. 3 tablet 0     Review of Systems  Constitutional:  Positive for fatigue.  HENT:  Positive for congestion and sore throat.   Eyes: Negative.   Respiratory:  Positive for cough.   Cardiovascular: Negative.   Gastrointestinal: Negative.   Endocrine: Negative.   Genitourinary: Negative.   Musculoskeletal: Negative.   Skin: Negative.   Allergic/Immunologic: Negative.   Neurological: Negative.   Hematological: Negative.   Psychiatric/Behavioral: Negative.    Physical Exam   Blood pressure 110/68, pulse (!) 117, temperature 98.3 F (36.8 C), temperature source Oral, resp. rate 20, height 5'  4" (1.626 m), weight 87.1 kg, SpO2 99 %, unknown if currently breastfeeding.  Physical Exam Vitals and nursing note reviewed.  Constitutional:      Appearance: Normal appearance. She is obese.  HENT:     Head: Normocephalic and atraumatic.     Mouth/Throat:     Mouth: Mucous membranes are moist.     Pharynx: Oropharyngeal exudate present.  Cardiovascular:     Rate and Rhythm: Tachycardia present.     Pulses: Normal pulses.  Pulmonary:     Effort: Pulmonary effort is normal.     Breath sounds: Normal breath sounds.  Abdominal:     General: Bowel sounds are normal.     Palpations: Abdomen is soft.  Genitourinary:    Comments: Not indicated Neurological:     Mental Status: She is alert.  Psychiatric:        Mood and Affect: Mood normal.        Behavior: Behavior normal.        Thought Content: Thought content normal.        Judgment: Judgment normal.   REACTIVE  NST - FHR: 135 bpm / moderate variability / accels present / decels absent / TOCO: none MAU Course  Procedures  MDM CEFM COVID, Flu and Strep A done at Urgent Care  Results for orders placed or performed during the hospital encounter of 03/07/21 (from the past 24 hour(s))  POCT rapid strep A     Status: None   Collection Time: 03/07/21  4:54 PM  Result Value Ref Range   Rapid Strep A Screen Negative Negative   Assessment and Plan  Upper respiratory tract infection, unspecified type - Plan: Discharge patient  - Advised to pick up and take abx as prescribed - Information provided on URI in adult - Patient verbalized an understanding of the plan of care and agrees.     Raelyn Mora, CNM 03/07/2021, 9:41 PM

## 2021-03-07 NOTE — ED Triage Notes (Signed)
Sore throat since Saturday with fever, feels like she's swallowing glass. Also c/o nasal congestion. 35 w/ pregnant

## 2021-03-08 LAB — COVID-19, FLU A+B NAA
Influenza A, NAA: NOT DETECTED
Influenza B, NAA: NOT DETECTED
SARS-CoV-2, NAA: NOT DETECTED

## 2021-03-08 MED ORDER — LIDOCAINE HCL (PF) 1 % IJ SOLN
INTRAMUSCULAR | Status: AC
  Filled 2021-03-08: qty 5

## 2021-03-08 MED ORDER — CEFTRIAXONE SODIUM 1 G IJ SOLR
INTRAMUSCULAR | Status: AC
  Filled 2021-03-08: qty 10

## 2021-03-09 LAB — CULTURE, GROUP A STREP (THRC)

## 2021-03-24 DIAGNOSIS — Z113 Encounter for screening for infections with a predominantly sexual mode of transmission: Secondary | ICD-10-CM | POA: Diagnosis not present

## 2021-03-24 DIAGNOSIS — Z361 Encounter for antenatal screening for raised alphafetoprotein level: Secondary | ICD-10-CM | POA: Diagnosis not present

## 2021-03-24 LAB — OB RESULTS CONSOLE GBS: GBS: NEGATIVE

## 2021-03-31 ENCOUNTER — Telehealth (HOSPITAL_COMMUNITY): Payer: Self-pay | Admitting: *Deleted

## 2021-03-31 ENCOUNTER — Encounter (HOSPITAL_COMMUNITY): Payer: Self-pay | Admitting: *Deleted

## 2021-03-31 NOTE — Telephone Encounter (Signed)
Preadmission screen  

## 2021-04-06 ENCOUNTER — Other Ambulatory Visit: Payer: Self-pay | Admitting: Obstetrics and Gynecology

## 2021-04-06 NOTE — H&P (Signed)
Helen White is a 25 y.o. female presenting for elective IOL. Pregnancy c/b thickened NT with low risk panorama. Fetal ECHO was normal. She declined amniocentesis.  She has a hx of two prior SVD.  OB History     Gravida  3   Para  2   Term  2   Preterm      AB      Living  2      SAB      IAB      Ectopic      Multiple  0   Live Births  2          Past Medical History:  Diagnosis Date   Migraine    Past Surgical History:  Procedure Laterality Date   NO PAST SURGERIES     Family History: family history includes Healthy in her father and mother. Social History:  reports that she has never smoked. She has never used smokeless tobacco. She reports that she does not drink alcohol and does not use drugs.     Maternal Diabetes: No Genetic Screening: Normal Maternal Ultrasounds/Referrals: Other: increased NT Fetal Ultrasounds or other Referrals:  Fetal echo Maternal Substance Abuse:  No Significant Maternal Medications:  None Significant Maternal Lab Results:  None Other Comments:  None  Review of Systems History   unknown if currently breastfeeding. Exam Physical Exam  (from office) NAD, A&O NWOB Abd soft, nondistended, gravid  Prenatal labs: ABO, Rh: O/Positive/-- (05/31 0000) Antibody: Negative (05/31 0000) Rubella: Immune (05/31 0000) RPR: Nonreactive (05/31 0000)  HBsAg: Negative (05/31 0000)  HIV: Non-reactive (05/31 0000)  GBS: Negative/-- (12/15 0000)   Assessment/Plan: 25 yo G3P2002 @ 39 WGA presenting for IOL s/s ELECTIVE. Cervix unfavorable. Plan for cytotec followed by pitocin/AROM when more favorable.  GBS NEGATIVE.    Ranae Pila 04/06/2021, 7:24 AM

## 2021-04-07 ENCOUNTER — Encounter (HOSPITAL_COMMUNITY): Payer: Self-pay | Admitting: Obstetrics and Gynecology

## 2021-04-07 LAB — SARS CORONAVIRUS 2 (TAT 6-24 HRS): SARS Coronavirus 2: NEGATIVE

## 2021-04-08 ENCOUNTER — Other Ambulatory Visit: Payer: Self-pay

## 2021-04-08 ENCOUNTER — Inpatient Hospital Stay (HOSPITAL_COMMUNITY): Payer: BC Managed Care – PPO

## 2021-04-08 ENCOUNTER — Encounter (HOSPITAL_COMMUNITY): Payer: Self-pay | Admitting: Obstetrics and Gynecology

## 2021-04-08 ENCOUNTER — Inpatient Hospital Stay (HOSPITAL_COMMUNITY)
Admission: AD | Admit: 2021-04-08 | Discharge: 2021-04-09 | DRG: 807 | Disposition: A | Discharge status: Home / Self Care | Payer: BC Managed Care – PPO | Attending: Obstetrics and Gynecology | Admitting: Obstetrics and Gynecology

## 2021-04-08 ENCOUNTER — Inpatient Hospital Stay (HOSPITAL_COMMUNITY): Payer: BC Managed Care – PPO | Admitting: Anesthesiology

## 2021-04-08 DIAGNOSIS — O26893 Other specified pregnancy related conditions, third trimester: Secondary | ICD-10-CM | POA: Diagnosis not present

## 2021-04-08 DIAGNOSIS — Z3490 Encounter for supervision of normal pregnancy, unspecified, unspecified trimester: Secondary | ICD-10-CM

## 2021-04-08 DIAGNOSIS — O9902 Anemia complicating childbirth: Secondary | ICD-10-CM | POA: Diagnosis not present

## 2021-04-08 DIAGNOSIS — Z23 Encounter for immunization: Secondary | ICD-10-CM | POA: Diagnosis not present

## 2021-04-08 DIAGNOSIS — Z3A39 39 weeks gestation of pregnancy: Secondary | ICD-10-CM

## 2021-04-08 DIAGNOSIS — D649 Anemia, unspecified: Secondary | ICD-10-CM | POA: Diagnosis not present

## 2021-04-08 HISTORY — DX: Gastro-esophageal reflux disease without esophagitis: K21.9

## 2021-04-08 HISTORY — DX: Anemia, unspecified: D64.9

## 2021-04-08 LAB — TYPE AND SCREEN
ABO/RH(D): O POS
Antibody Screen: NEGATIVE

## 2021-04-08 LAB — CBC
HCT: 32.5 % — ABNORMAL LOW (ref 36.0–46.0)
Hemoglobin: 10.1 g/dL — ABNORMAL LOW (ref 12.0–15.0)
MCH: 24.9 pg — ABNORMAL LOW (ref 26.0–34.0)
MCHC: 31.1 g/dL (ref 30.0–36.0)
MCV: 80.2 fL (ref 80.0–100.0)
Platelets: 297 10*3/uL (ref 150–400)
RBC: 4.05 MIL/uL (ref 3.87–5.11)
RDW: 15.2 % (ref 11.5–15.5)
WBC: 8.8 10*3/uL (ref 4.0–10.5)
nRBC: 0 % (ref 0.0–0.2)

## 2021-04-08 LAB — RPR: RPR Ser Ql: NONREACTIVE

## 2021-04-08 MED ORDER — OXYTOCIN-SODIUM CHLORIDE 30-0.9 UT/500ML-% IV SOLN: 2.5000 [IU]/h | INTRAVENOUS | Status: DC

## 2021-04-08 MED ORDER — ACETAMINOPHEN 325 MG PO TABS: 650.0000 mg | ORAL_TABLET | ORAL | Status: DC | PRN

## 2021-04-08 MED ORDER — SENNOSIDES-DOCUSATE SODIUM 8.6-50 MG PO TABS
2.0000 | ORAL_TABLET | ORAL | Status: DC
  Administered 2021-04-09: 2 via ORAL
  Filled 2021-04-08: qty 2

## 2021-04-08 MED ORDER — TRANEXAMIC ACID-NACL 1000-0.7 MG/100ML-% IV SOLN: 1000.0000 mg | Freq: Once | INTRAVENOUS | Status: DC | PRN

## 2021-04-08 MED ORDER — TETANUS-DIPHTH-ACELL PERTUSSIS 5-2.5-18.5 LF-MCG/0.5 IM SUSY: 0.5000 mL | PREFILLED_SYRINGE | Freq: Once | INTRAMUSCULAR | Status: DC

## 2021-04-08 MED ORDER — OXYCODONE-ACETAMINOPHEN 5-325 MG PO TABS: 1.0000 | ORAL_TABLET | ORAL | Status: DC | PRN

## 2021-04-08 MED ORDER — PRENATAL MULTIVITAMIN CH
1.0000 | ORAL_TABLET | Freq: Every day | ORAL | Status: DC
  Administered 2021-04-09: 1 via ORAL
  Filled 2021-04-08: qty 1

## 2021-04-08 MED ORDER — PHENYLEPHRINE 40 MCG/ML (10ML) SYRINGE FOR IV PUSH (FOR BLOOD PRESSURE SUPPORT): 80.0000 ug | PREFILLED_SYRINGE | INTRAVENOUS | Status: DC | PRN

## 2021-04-08 MED ORDER — TERBUTALINE SULFATE 1 MG/ML IJ SOLN: 0.2500 mg | Freq: Once | INTRAMUSCULAR | Status: DC | PRN

## 2021-04-08 MED ORDER — LIDOCAINE HCL (PF) 1 % IJ SOLN: 30.0000 mL | INTRAMUSCULAR | Status: DC | PRN

## 2021-04-08 MED ORDER — OXYTOCIN-SODIUM CHLORIDE 30-0.9 UT/500ML-% IV SOLN: 1.0000 m[IU]/min | INTRAVENOUS | Status: DC

## 2021-04-08 MED ORDER — DIPHENHYDRAMINE HCL 50 MG/ML IJ SOLN: 12.5000 mg | INTRAMUSCULAR | Status: DC | PRN

## 2021-04-08 MED ORDER — LACTATED RINGERS IV SOLN: INTRAVENOUS | Status: DC

## 2021-04-08 MED ORDER — EPHEDRINE 5 MG/ML INJ: 10.0000 mg | INTRAVENOUS | Status: DC | PRN

## 2021-04-08 MED ORDER — HYDROXYZINE HCL 50 MG PO TABS: 50.0000 mg | ORAL_TABLET | Freq: Four times a day (QID) | ORAL | Status: DC | PRN

## 2021-04-08 MED ORDER — ONDANSETRON HCL 4 MG/2ML IJ SOLN: 4.0000 mg | Freq: Four times a day (QID) | INTRAMUSCULAR | Status: DC | PRN

## 2021-04-08 MED ORDER — LACTATED RINGERS IV SOLN: 500.0000 mL | INTRAVENOUS | Status: DC | PRN

## 2021-04-08 MED ORDER — IBUPROFEN 600 MG PO TABS
600.0000 mg | ORAL_TABLET | Freq: Four times a day (QID) | ORAL | Status: DC
  Administered 2021-04-08 – 2021-04-09 (×4): 600 mg via ORAL
  Filled 2021-04-08 (×4): qty 1

## 2021-04-08 MED ORDER — BENZOCAINE-MENTHOL 20-0.5 % EX AERO
1.0000 "application " | INHALATION_SPRAY | CUTANEOUS | Status: DC | PRN
  Administered 2021-04-08: 1 via TOPICAL
  Filled 2021-04-08: qty 56

## 2021-04-08 MED ORDER — LIDOCAINE-EPINEPHRINE (PF) 2 %-1:200000 IJ SOLN
INTRAMUSCULAR | Status: DC | PRN
  Administered 2021-04-08: 5 mL via EPIDURAL

## 2021-04-08 MED ORDER — ONDANSETRON HCL 4 MG PO TABS: 4.0000 mg | ORAL_TABLET | ORAL | Status: DC | PRN

## 2021-04-08 MED ORDER — SIMETHICONE 80 MG PO CHEW: 80.0000 mg | CHEWABLE_TABLET | ORAL | Status: DC | PRN

## 2021-04-08 MED ORDER — ZOLPIDEM TARTRATE 5 MG PO TABS: 5.0000 mg | ORAL_TABLET | Freq: Every evening | ORAL | Status: DC | PRN

## 2021-04-08 MED ORDER — OXYTOCIN-SODIUM CHLORIDE 30-0.9 UT/500ML-% IV SOLN
1.0000 m[IU]/min | INTRAVENOUS | Status: DC
Start: 2021-04-08 — End: 2021-04-08
  Administered 2021-04-08: 2 m[IU]/min via INTRAVENOUS
  Filled 2021-04-08: qty 500

## 2021-04-08 MED ORDER — LACTATED RINGERS IV SOLN: 500.0000 mL | Freq: Once | INTRAVENOUS | Status: DC

## 2021-04-08 MED ORDER — WITCH HAZEL-GLYCERIN EX PADS: 1.0000 "application " | MEDICATED_PAD | CUTANEOUS | Status: DC | PRN

## 2021-04-08 MED ORDER — DIBUCAINE (PERIANAL) 1 % EX OINT: 1.0000 "application " | TOPICAL_OINTMENT | CUTANEOUS | Status: DC | PRN

## 2021-04-08 MED ORDER — TRANEXAMIC ACID-NACL 1000-0.7 MG/100ML-% IV SOLN
INTRAVENOUS | Status: AC
  Administered 2021-04-08: 15:00:00 1000 mg
  Filled 2021-04-08: qty 100

## 2021-04-08 MED ORDER — OXYCODONE HCL 5 MG PO TABS: 10.0000 mg | ORAL_TABLET | ORAL | Status: DC | PRN

## 2021-04-08 MED ORDER — SOD CITRATE-CITRIC ACID 500-334 MG/5ML PO SOLN: 30.0000 mL | ORAL | Status: DC | PRN

## 2021-04-08 MED ORDER — OXYTOCIN BOLUS FROM INFUSION
333.0000 mL | Freq: Once | INTRAVENOUS | Status: AC
  Administered 2021-04-08: 15:00:00 333 mL via INTRAVENOUS

## 2021-04-08 MED ORDER — ONDANSETRON HCL 4 MG/2ML IJ SOLN: 4.0000 mg | INTRAMUSCULAR | Status: DC | PRN

## 2021-04-08 MED ORDER — COCONUT OIL OIL: 1.0000 "application " | TOPICAL_OIL | Status: DC | PRN

## 2021-04-08 MED ORDER — MISOPROSTOL 25 MCG QUARTER TABLET: 25.0000 ug | ORAL_TABLET | ORAL | Status: DC | PRN

## 2021-04-08 MED ORDER — OXYCODONE-ACETAMINOPHEN 5-325 MG PO TABS: 2.0000 | ORAL_TABLET | ORAL | Status: DC | PRN

## 2021-04-08 MED ORDER — OXYCODONE HCL 5 MG PO TABS: 5.0000 mg | ORAL_TABLET | ORAL | Status: DC | PRN

## 2021-04-08 MED ORDER — DIPHENHYDRAMINE HCL 25 MG PO CAPS: 25.0000 mg | ORAL_CAPSULE | Freq: Four times a day (QID) | ORAL | Status: DC | PRN

## 2021-04-08 MED ORDER — BUTORPHANOL TARTRATE 1 MG/ML IJ SOLN: 1.0000 mg | INTRAMUSCULAR | Status: DC | PRN

## 2021-04-08 MED ORDER — FENTANYL-BUPIVACAINE-NACL 0.5-0.125-0.9 MG/250ML-% EP SOLN
12.0000 mL/h | EPIDURAL | Status: DC | PRN
  Administered 2021-04-08: 10:00:00 12 mL/h via EPIDURAL
  Filled 2021-04-08: qty 250

## 2021-04-08 MED ORDER — TRANEXAMIC ACID-NACL 1000-0.7 MG/100ML-% IV SOLN: 1000.0000 mg | INTRAVENOUS | Status: DC

## 2021-04-08 NOTE — Lactation Note (Signed)
This note was copied from a baby's chart. Lactation Consultation Note  Patient Name: Boy Ruqayyah Lute PRXYV'O Date: 04/08/2021 Reason for consult: L&D Initial assessment;Mother's request;Term;Other (Comment);Breastfeeding assistance (Anemia) Age:26 hours LC assisted with latching with signs of milk transfer.  Mom has electric pump at home. Mom denied any pain with the latch.  Mom to receive further LC support on the floor. Infant still latched and feeding at the end of the visit.  Maternal Data Does the patient have breastfeeding experience prior to this delivery?: Yes How long did the patient breastfeed?: first child did not latch as per Mom (due to lip tie), second child tried for 2 weeks had a milk allergy  Feeding Mother's Current Feeding Choice: Breast Milk  LATCH Score Latch: Grasps breast easily, tongue down, lips flanged, rhythmical sucking.  Audible Swallowing: Spontaneous and intermittent  Type of Nipple: Everted at rest and after stimulation  Comfort (Breast/Nipple): Soft / non-tender  Hold (Positioning): Assistance needed to correctly position infant at breast and maintain latch.  LATCH Score: 9   Lactation Tools Discussed/Used    Interventions Interventions: Breast feeding basics reviewed;Assisted with latch;Skin to skin;Hand express;Breast compression;Adjust position;Education  Discharge Pump: Personal  Consult Status Consult Status: Follow-up Date: 04/09/21 Follow-up type: In-patient    Sarena Jezek  Nicholson-Springer 04/08/2021, 3:17 PM

## 2021-04-08 NOTE — Progress Notes (Signed)
Doing well. Had contractions overnight that resolved SVE 2/50/-2, AROM clear fluid Start pitocin GBS negative

## 2021-04-08 NOTE — Anesthesia Procedure Notes (Signed)
Epidural Patient location during procedure: OB Start time: 04/08/2021 10:10 AM End time: 04/08/2021 10:20 AM  Staffing Anesthesiologist: Elmer Picker, MD Performed: anesthesiologist   Preanesthetic Checklist Completed: patient identified, IV checked, risks and benefits discussed, monitors and equipment checked, pre-op evaluation and timeout performed  Epidural Patient position: sitting Prep: DuraPrep and site prepped and draped Patient monitoring: continuous pulse ox, blood pressure, heart rate and cardiac monitor Approach: midline Location: L3-L4 Injection technique: LOR air  Needle:  Needle type: Tuohy  Needle gauge: 17 G Needle length: 9 cm Needle insertion depth: 6 cm Catheter type: closed end flexible Catheter size: 19 Gauge Catheter at skin depth: 11 cm Test dose: negative  Assessment Sensory level: T8 Events: blood not aspirated, injection not painful, no injection resistance, no paresthesia and negative IV test  Additional Notes Patient identified. Risks/Benefits/Options discussed with patient including but not limited to bleeding, infection, nerve damage, paralysis, failed block, incomplete pain control, headache, blood pressure changes, nausea, vomiting, reactions to medication both or allergic, itching and postpartum back pain. Confirmed with bedside nurse the patient's most recent platelet count. Confirmed with patient that they are not currently taking any anticoagulation, have any bleeding history or any family history of bleeding disorders. Patient expressed understanding and wished to proceed. All questions were answered. Sterile technique was used throughout the entire procedure. Please see nursing notes for vital signs. Test dose was given through epidural catheter and negative prior to continuing to dose epidural or start infusion. Warning signs of high block given to the patient including shortness of breath, tingling/numbness in hands, complete motor  block, or any concerning symptoms with instructions to call for help. Patient was given instructions on fall risk and not to get out of bed. All questions and concerns addressed with instructions to call with any issues or inadequate analgesia.  Reason for block:procedure for pain

## 2021-04-08 NOTE — Progress Notes (Signed)
SVE per RN 5/80/-1 Continue pitocin

## 2021-04-08 NOTE — Anesthesia Preprocedure Evaluation (Signed)
Anesthesia Evaluation  Patient identified by MRN, date of birth, ID band Patient awake    Reviewed: Allergy & Precautions, NPO status , Patient's Chart, lab work & pertinent test results  Airway Mallampati: II  TM Distance: >3 FB Neck ROM: Full    Dental no notable dental hx.    Pulmonary neg pulmonary ROS,    Pulmonary exam normal breath sounds clear to auscultation       Cardiovascular negative cardio ROS Normal cardiovascular exam Rhythm:Regular Rate:Normal     Neuro/Psych  Headaches, negative psych ROS   GI/Hepatic Neg liver ROS, GERD  ,  Endo/Other  negative endocrine ROS  Renal/GU negative Renal ROS  negative genitourinary   Musculoskeletal negative musculoskeletal ROS (+)   Abdominal   Peds  Hematology  (+) Blood dyscrasia, anemia ,   Anesthesia Other Findings Elective IOL  Reproductive/Obstetrics (+) Pregnancy                             Anesthesia Physical Anesthesia Plan  ASA: 2  Anesthesia Plan: Epidural   Post-op Pain Management:    Induction:   PONV Risk Score and Plan: Treatment may vary due to age or medical condition  Airway Management Planned: Natural Airway  Additional Equipment:   Intra-op Plan:   Post-operative Plan:   Informed Consent: I have reviewed the patients History and Physical, chart, labs and discussed the procedure including the risks, benefits and alternatives for the proposed anesthesia with the patient or authorized representative who has indicated his/her understanding and acceptance.       Plan Discussed with: Anesthesiologist  Anesthesia Plan Comments: (Patient identified. Risks, benefits, options discussed with patient including but not limited to bleeding, infection, nerve damage, paralysis, failed block, incomplete pain control, headache, blood pressure changes, nausea, vomiting, reactions to medication, itching, and post partum  back pain. Confirmed with bedside nurse the patient's most recent platelet count. Confirmed with the patient that they are not taking any anticoagulation, have any bleeding history or any family history of bleeding disorders. Patient expressed understanding and wishes to proceed. All questions were answered. )        Anesthesia Quick Evaluation

## 2021-04-08 NOTE — Lactation Note (Signed)
This note was copied from a baby's chart. Lactation Consultation Note  Patient Name: Helen White [id number] Date: 04/08/2021   Age: 26 hrs  Mom stated infant latched for 20 min resting comfortably. Mom prefer to be seen in am with lactation.   Maternal Data    Feeding    LATCH Score                    Lactation Tools Discussed/Used    Interventions    Discharge    Consult Status      Helen Ramroop  White 04/08/2021, 10:07 PM

## 2021-04-09 LAB — CBC
HCT: 28.6 % — ABNORMAL LOW (ref 36.0–46.0)
Hemoglobin: 9 g/dL — ABNORMAL LOW (ref 12.0–15.0)
MCH: 25.2 pg — ABNORMAL LOW (ref 26.0–34.0)
MCHC: 31.5 g/dL (ref 30.0–36.0)
MCV: 80.1 fL (ref 80.0–100.0)
Platelets: 280 10*3/uL (ref 150–400)
RBC: 3.57 MIL/uL — ABNORMAL LOW (ref 3.87–5.11)
RDW: 15.4 % (ref 11.5–15.5)
WBC: 11.2 10*3/uL — ABNORMAL HIGH (ref 4.0–10.5)
nRBC: 0 % (ref 0.0–0.2)

## 2021-04-09 NOTE — Discharge Summary (Signed)
° °  Postpartum Discharge Summary ° °   °Patient Name: Helen White °DOB: 02/14/1996 °MRN: [id number] ° °Date of admission: 04/08/2021 °Delivery date:04/08/2021  °Delivering provider: LEGER, ELISE JENNIFER  °Date of discharge: 04/09/2021 ° °Admitting diagnosis: Pregnancy [Z34.90] °Intrauterine pregnancy: [redacted]w[redacted]d     °Secondary diagnosis:  Principal Problem: °  Pregnancy ° °Additional problems: None    °Discharge diagnosis: Term Pregnancy Delivered                                              °Post partum procedures: None °Augmentation: AROM and Pitocin °Complications: None ° °Hospital course: Induction of Labor With Vaginal Delivery   °25 y.o. yo G3P3003 at [redacted]w[redacted]d was admitted to the hospital 04/08/2021 for induction of labor.  Indication for induction: Elective.  Patient had an uncomplicated labor course as follows: °Membrane Rupture Time/Date: 8:48 AM ,04/08/2021   °Delivery Method:Vaginal, Spontaneous  °Episiotomy: None  °Lacerations:  1st degree;Perineal  °Details of delivery can be found in separate delivery note.  Patient had a routine postpartum course. Patient is discharged home 04/09/21. ° °Newborn Data: °Birth date:04/08/2021  °Birth time:2:31 PM  °Gender:Female  °Living status:Living  °Apgars:8 ,9  °Weight:3575 g  ° °Magnesium Sulfate received: No °BMZ received: No °Rhophylac:N/A °MMR:N/A °T-DaP:Given prenatally °Flu: N/A °Transfusion:No ° °Physical exam  °Vitals:  ° 04/08/21 1740 04/08/21 2230 04/09/21 0200 04/09/21 0645  °BP: 109/66 116/68 104/70 100/71  °Pulse: (!) 104 (!) 104 85 88  °Resp: 16 16 16 18  °Temp: 98 °F (36.7 °C) 98 °F (36.7 °C) 98.1 °F (36.7 °C) 98 °F (36.7 °C)  °TempSrc: Oral Oral Oral Oral  °SpO2:  98% 99% 99%  °Weight:      °Height:      ° °General: alert °Lochia: appropriate °Uterine Fundus: firm °Incision: N/A °DVT Evaluation: No evidence of DVT seen on physical exam. °Labs: °Lab Results  °Component Value Date  ° WBC 11.2 (H) 04/09/2021  ° HGB 9.0 (L) 04/09/2021  ° HCT 28.6 (L)  04/09/2021  ° MCV 80.1 04/09/2021  ° PLT 280 04/09/2021  ° °CMP Latest Ref Rng & Units 01/29/2021  °Glucose 70 - 99 mg/dL 81  °BUN 6 - 20 mg/dL 5(L)  °Creatinine 0.44 - 1.00 mg/dL 0.43(L)  °Sodium 135 - 145 mmol/L 134(L)  °Potassium 3.5 - 5.1 mmol/L 4.2  °Chloride 98 - 111 mmol/L 105  °CO2 22 - 32 mmol/L 21(L)  °Calcium 8.9 - 10.3 mg/dL 8.5(L)  °Total Protein 6.5 - 8.1 g/dL 6.8  °Total Bilirubin 0.3 - 1.2 mg/dL 0.4  °Alkaline Phos 38 - 126 U/L 91  °AST 15 - 41 U/L 12(L)  °ALT 0 - 44 U/L 10  ° °Edinburgh Score: °Edinburgh Postnatal Depression Scale Screening Tool 01/09/2020  °I have been able to laugh and see the funny side of things. 0  °I have looked forward with enjoyment to things. 0  °I have blamed myself unnecessarily when things went wrong. 1  °I have been anxious or worried for no good reason. 0  °I have felt scared or panicky for no good reason. 0  °Things have been getting on top of me. 1  °I have been so unhappy that I have had difficulty sleeping. 0  °I have felt sad or miserable. 0  °I have been so unhappy that I have been crying. 0  °The thought of harming myself   has occurred to me. 0  °Edinburgh Postnatal Depression Scale Total 2  ° ° ° ° °After visit meds:  °Allergies as of 04/09/2021   ° °   Reactions  ° Penicillins Hives  ° Has patient had a PCN reaction causing immediate rash, facial/tongue/throat swelling, SOB or lightheadedness with hypotension: No °Has patient had a PCN reaction causing severe rash involving mucus membranes or skin necrosis: No °Has patient had a PCN reaction that required hospitalization: No °Has patient had a PCN reaction occurring within the last 10 years: No °If all of the above answers are "NO", then may proceed with Cephalosporin use.  ° °  ° °  °Medication List  °  ° °TAKE these medications   ° °prenatal multivitamin Tabs tablet °Take 1 tablet by mouth at bedtime. °  ° °  ° ° ° °Discharge home in stable condition °Infant Feeding: Bottle and Breast °Infant Disposition:home  with mother °Discharge instruction: per After Visit Summary and Postpartum booklet. °Activity: Advance as tolerated. Pelvic rest for 6 weeks.  °Diet: routine diet °Anticipated Birth Control: Unsure °Postpartum Appointment:6 weeks °Additional Postpartum F/U:  None °Future Appointments:No future appointments. °Follow up Visit: ° ° °  ° °04/09/2021 °Elise Jennifer Leger, MD ° ° °

## 2021-04-09 NOTE — Lactation Note (Signed)
This note was copied from a baby's chart. Lactation Consultation Note  Patient Name: Helen White [id number] Date: 04/09/2021 Reason for consult: Initial assessment Age:26 hours  Mom states she desires to breastfeed.  Baby recently fed per mom.  Mom feels baby is not latching deeply enough.  Tenderness on nipples.  LC suggested providing head support and bringing baby to the breast when latching.  LC reviewed cross cradle and encouraged trying this position instead of cradle.    Baby was too sleepy to attempt to latch.  LC highly encouraged mom call out when infant wakes for LC to observe latch and assist with obtaining a deeper latch.  Baby just back from circumcision and is sleeping.    Brochure and resources reviewed with mom.    Maternal Data Has patient been taught Hand Expression?: Yes  Feeding Mother's Current Feeding Choice: Breast Milk  LATCH Score Latch: Too sleepy or reluctant, no latch achieved, no sucking elicited.  Audible Swallowing: None  Type of Nipple: Everted at rest and after stimulation  Comfort (Breast/Nipple): Filling, red/small blisters or bruises, mild/mod discomfort (slightly red/ tender)  Hold (Positioning): Assistance needed to correctly position infant at breast and maintain latch.  LATCH Score: 4   Lactation Tools Discussed/Used    Interventions Interventions: Breast feeding basics reviewed;Assisted with latch;Hand express;Support pillows;Position options  Discharge    Consult Status Consult Status: Follow-up Date: 04/09/21 Follow-up type: In-patient    Maryruth Hancock [facility] 04/09/2021, 12:28 PM

## 2021-04-09 NOTE — Lactation Note (Signed)
This note was copied from a baby's chart. Lactation Consultation Note  Patient Name: Helen White [id number] Date: 04/09/2021   Age:26 hours  LC entered room and mom and dad were resting with lights out.  Baby in bassinet.  LC will visit later today.    Maternal Data    Feeding    LATCH Score                    Lactation Tools Discussed/Used    Interventions    Discharge    Consult Status      Maryruth Hancock [facility] 04/09/2021, 8:20 AM

## 2021-04-09 NOTE — Lactation Note (Signed)
This note was copied from a baby's chart. Lactation Consultation Note  Patient Name: Helen White Date: 04/09/2021 Reason for consult: Mother's request;Difficult latch;Follow-up assessment;Term Age:26 hours, term female infant -3% weight loss. P3, per mom, she briefly breastfeed her other two children 1st child only in hospital and 2nd child for 2 weeks, mom is committed to breastfeeding her 3rd child.  Per mom, infant been having few shallow latches, mom with discomfort, mom requested latch assistance. Mom broke latch and re-latched  infant  with assistance from  [facility], LC asked mom to expressed small amount colostrum prior to latching infant, mom brought infant chin first, tongue down and lower jaw extended,  infant latched with depth, swallows were observed. Infant was still breastfeeding after 8 minutes when LC left the room, per mom, she doesn't feel any discomfort with this latch . Mom's plan: 1-Mom will continue to breastfeed infant according to feeding cues, 8 to 12+ times within 24 hours, skin to skin. 2- Mom knows to break latch and re-latch infant if she feel pain or pinching and not a tug. 3- Mom knows to call RN/LC if she has further questions, concerns or need further assistance with latching infant at the breast. Maternal Data Has patient been taught Hand Expression?: Yes  Feeding Mother's Current Feeding Choice: Breast Milk  LATCH Score Latch: Grasps breast easily, tongue down, lips flanged, rhythmical sucking.  Audible Swallowing: A few with stimulation  Type of Nipple: Everted at rest and after stimulation  Comfort (Breast/Nipple): Soft / non-tender  Hold (Positioning): Assistance needed to correctly position infant at breast and maintain latch.  LATCH Score: 8   Lactation Tools Discussed/Used    Interventions Interventions: Skin to skin;Breast compression;Adjust position;Support pillows;Position options;Expressed milk;Education  Discharge     Consult Status Consult Status: Follow-up Date: 04/10/21 Follow-up type: In-patient    Danelle Earthly 04/09/2021, 3:02 PM

## 2021-04-09 NOTE — Anesthesia Postprocedure Evaluation (Signed)
Anesthesia Post Note  Patient: Helen White  Procedure(s) Performed: AN AD HOC LABOR EPIDURAL     Patient location during evaluation: Mother Baby Anesthesia Type: Epidural Level of consciousness: awake and alert Pain management: pain level controlled Vital Signs Assessment: post-procedure vital signs reviewed and stable Respiratory status: spontaneous breathing, nonlabored ventilation and respiratory function stable Cardiovascular status: stable Postop Assessment: no headache, no backache and epidural receding Anesthetic complications: no   No notable events documented.  Last Vitals:  Vitals:   04/09/21 0200 04/09/21 0645  BP: 104/70 100/71  Pulse: 85 88  Resp: 16 18  Temp: 36.7 C 36.7 C  SpO2: 99% 99%    Last Pain:  Vitals:   04/09/21 1133  TempSrc:   PainSc: 1    Pain Goal: Patients Stated Pain Goal: 3 (04/09/21 1133)                 Rica Records

## 2021-04-12 DIAGNOSIS — Z00110 Health examination for newborn under 8 days old: Secondary | ICD-10-CM | POA: Diagnosis not present

## 2021-04-13 ENCOUNTER — Inpatient Hospital Stay (HOSPITAL_COMMUNITY): Admit: 2021-04-13 | Payer: Self-pay

## 2021-04-19 ENCOUNTER — Telehealth (HOSPITAL_COMMUNITY): Payer: Self-pay | Admitting: *Deleted

## 2021-04-19 NOTE — Telephone Encounter (Signed)
Attempted hospital discharge follow-up call. Left message for patient to return RN call. Deforest Hoyles, RN, 04/19/21, [phone]

## 2021-05-19 DIAGNOSIS — Z1389 Encounter for screening for other disorder: Secondary | ICD-10-CM | POA: Diagnosis not present

## 2021-05-23 DIAGNOSIS — Z30013 Encounter for initial prescription of injectable contraceptive: Secondary | ICD-10-CM | POA: Diagnosis not present

## 2021-12-02 ENCOUNTER — Ambulatory Visit (INDEPENDENT_AMBULATORY_CARE_PROVIDER_SITE_OTHER): Payer: 59

## 2021-12-02 ENCOUNTER — Ambulatory Visit: Payer: 59

## 2021-12-02 ENCOUNTER — Ambulatory Visit: Admit: 2021-12-02 | Payer: Medicaid Other

## 2021-12-02 ENCOUNTER — Ambulatory Visit
Admission: EM | Admit: 2021-12-02 | Discharge: 2021-12-02 | Disposition: A | Discharge status: Home / Self Care | Payer: 59 | Attending: Emergency Medicine | Admitting: Emergency Medicine

## 2021-12-02 DIAGNOSIS — M25572 Pain in left ankle and joints of left foot: Secondary | ICD-10-CM | POA: Diagnosis not present

## 2021-12-02 DIAGNOSIS — S93402A Sprain of unspecified ligament of left ankle, initial encounter: Secondary | ICD-10-CM | POA: Diagnosis not present

## 2021-12-02 NOTE — ED Triage Notes (Signed)
The pt c/o left ankle pain, she states she fell 3 weeks ago and twisted her ankle.

## 2021-12-02 NOTE — Discharge Instructions (Addendum)
The x-ray of your left ankle did not reveal any acute bony abnormalities or injury.  I believe it is safe to say that you have sprained your left ankle.  Because you have not seen any meaningful improvement in the past 3 weeks, recommend that she go to [facility] for [facility] evaluation.  They have a walk-in clinic that is open tomorrow, I provided you with the contact information and address.  Thank you for visiting urgent care today.

## 2021-12-02 NOTE — ED Provider Notes (Signed)
[facility]    CSN: [id number] Arrival date & time: 12/02/21  1859    HISTORY   Chief Complaint  Patient presents with  . Ankle Pain   HPI Helen White is a pleasant, 26 y.o. female who presents to urgent care today.  Ankle Pain Past Medical History:  Diagnosis Date  . Anemia   . GERD (gastroesophageal reflux disease)   . Migraine    Patient Active Problem List   Diagnosis Date Noted  . Pregnancy 04/08/2021  . Encounter for elective induction of labor 01/08/2020   Past Surgical History:  Procedure Laterality Date  . NO PAST SURGERIES     OB History     Gravida  3   Para  3   Term  3   Preterm      AB      Living  3      SAB      IAB      Ectopic      Multiple  0   Live Births  3          Home Medications    Prior to Admission medications   Medication Sig Start Date End Date Taking? Authorizing Provider  Prenatal Vit-Fe Fumarate-FA (PRENATAL MULTIVITAMIN) TABS tablet Take 1 tablet by mouth at bedtime.     [provider]  ipratropium (ATROVENT) 0.06 % nasal spray Place 2 sprays into both nostrils 4 (four) times daily. 03/17/18 01/03/19  Belinda Fisher, PA-C    Family History Family History  Problem Relation Age of Onset  . Healthy Mother   . Healthy Father    Social History Social History   Tobacco Use  . Smoking status: Never  . Smokeless tobacco: Never  Vaping Use  . Vaping Use: Never used  Substance Use Topics  . Alcohol use: No  . Drug use: No   Allergies   Penicillins  Review of Systems Review of Systems Pertinent findings revealed after performing a 14 point review of systems has been noted in the history of present illness.  Physical Exam Triage Vital Signs ED Triage Vitals  Enc Vitals Group     BP 02/04/21 0827 (!) 147/82     Pulse Rate 02/04/21 0827 72     Resp 02/04/21 0827 18     Temp 02/04/21 0827 98.3 F (36.8 C)     Temp Source 02/04/21 0827 Oral     SpO2 02/04/21 0827 98 %      Weight --      Height --      Head Circumference --      Peak Flow --      Pain Score 02/04/21 0826 5     Pain Loc --      Pain Edu? --      Excl. in GC? --    Updated Vital Signs BP 100/69 (BP Location: Right Arm)   Pulse 97   Temp 98.1 F (36.7 C) (Oral)   Resp 18   SpO2 96%   Breastfeeding No   Physical Exam  UC Couse / Diagnostics / Procedures:     Radiology No results found.  Procedures Procedures (including critical care time) EKG  Pending results:  Labs Reviewed - No data to display  Medications Ordered in UC: Medications - No data to display  UC Diagnoses / Final Clinical Impressions(s)   I have reviewed the triage vital signs and the nursing notes.  Pertinent labs & imaging results  that were available during my care of the patient were reviewed by me and considered in my medical decision making (see chart for details).    Final diagnoses:  None    Patient was provided with an injection during their visit today for acute pain relief.  Patient was advised to:  {LOWERBACKPAINPLAN:26747}  ED Prescriptions   None    PDMP not reviewed this encounter.  Discharge Instructions: Discharge Instructions   None     Disposition Upon Discharge:  Condition: stable for discharge home Home: take medications as prescribed; routine discharge instructions as discussed; follow up as advised.  Patient presented with an acute illness with associated systemic symptoms and significant discomfort requiring urgent management. In my opinion, this is a condition that a prudent lay person (someone who possesses an average knowledge of health and medicine) may potentially expect to result in complications if not addressed urgently such as respiratory distress, impairment of bodily function or dysfunction of bodily organs.   Routine symptom specific, illness specific and/or disease specific instructions were discussed with the patient and/or caregiver at length.   As  such, the patient has been evaluated and assessed, work-up was performed and treatment was provided in alignment with urgent care protocols and evidence based medicine.  Patient/parent/caregiver has been advised that the patient may require follow up for further testing and treatment if the symptoms continue in spite of treatment, as clinically indicated and appropriate.  Patient/parent/caregiver has been advised to report to orthopedic urgent care clinic or return to the [facility] or PCP in 3-5 days if no better; follow-up with orthopedics, PCP or the Emergency Department if new signs and symptoms develop or if the current signs or symptoms continue to change or worsen for further workup, evaluation and treatment as clinically indicated and appropriate  The patient will follow up with their current PCP if and as advised. If the patient does not currently have a PCP we will have assisted them in obtaining one.   The patient may need specialty follow up if the symptoms continue, in spite of conservative treatment and management, for further workup, evaluation, consultation and treatment as clinically indicated and appropriate.  Patient/parent/caregiver verbalized understanding and agreement of plan as discussed.  All questions were addressed during visit.  Please see discharge instructions below for further details of plan.  This office note has been dictated using Teaching laboratory technician.  Unfortunately, this method of dictation can sometimes lead to typographical or grammatical errors.  I apologize for your inconvenience in advance if this occurs.  Please do not hesitate to reach out to me if clarification is needed.

## 2022-03-13 ENCOUNTER — Ambulatory Visit
Admission: EM | Admit: 2022-03-13 | Discharge: 2022-03-13 | Disposition: A | Discharge status: Home / Self Care | Payer: 59 | Attending: Urgent Care | Admitting: Urgent Care

## 2022-03-13 DIAGNOSIS — B349 Viral infection, unspecified: Secondary | ICD-10-CM | POA: Insufficient documentation

## 2022-03-13 DIAGNOSIS — Z1152 Encounter for screening for COVID-19: Secondary | ICD-10-CM | POA: Diagnosis not present

## 2022-03-13 LAB — RESP PANEL BY RT-PCR (FLU A&B, COVID) ARPGX2
Influenza A by PCR: NEGATIVE
Influenza B by PCR: NEGATIVE
SARS Coronavirus 2 by RT PCR: NEGATIVE

## 2022-03-13 MED ORDER — BENZONATATE 100 MG PO CAPS: 100.0000 mg | ORAL_CAPSULE | Freq: Three times a day (TID) | ORAL | 0 refills | Status: DC | PRN

## 2022-03-13 MED ORDER — ONDANSETRON 8 MG PO TBDP: 8.0000 mg | ORAL_TABLET | Freq: Three times a day (TID) | ORAL | 0 refills | Status: DC | PRN

## 2022-03-13 MED ORDER — LOPERAMIDE HCL 2 MG PO CAPS: 2.0000 mg | ORAL_CAPSULE | Freq: Two times a day (BID) | ORAL | 0 refills | Status: DC | PRN

## 2022-03-13 MED ORDER — CETIRIZINE HCL 10 MG PO TABS: 10.0000 mg | ORAL_TABLET | Freq: Every day | ORAL | 0 refills | Status: DC

## 2022-03-13 MED ORDER — IPRATROPIUM BROMIDE 0.03 % NA SOLN: 2.0000 | Freq: Two times a day (BID) | NASAL | 0 refills | Status: DC

## 2022-03-13 MED ORDER — PROMETHAZINE-DM 6.25-15 MG/5ML PO SYRP: 2.5000 mL | ORAL_SOLUTION | Freq: Three times a day (TID) | ORAL | 0 refills | Status: DC | PRN

## 2022-03-13 NOTE — ED Triage Notes (Signed)
Pt c/o body aches, fever, n/v/d-started yesterday-NAD-steady gait

## 2022-03-13 NOTE — Discharge Instructions (Signed)
We will notify you of your test results as they arrive and may take between about 24 hours.  I encourage you to sign up for MyChart if you have not already done so as this can be the easiest way for us to communicate results to you online or through a phone app.  Generally, we only contact you if it is a positive test result.  In the meantime, if you develop worsening symptoms including fever, chest pain, shortness of breath despite our current treatment plan then please report to the emergency room as this may be a sign of worsening status from possible viral infection.  Otherwise, we will manage this as a viral syndrome. For sore throat or cough try using a honey-based tea. Use 3 teaspoons of honey with juice squeezed from half lemon. Place shaved pieces of ginger into 1/2-1 cup of water and warm over stove top. Then mix the ingredients and repeat every 4 hours as needed. Please take Tylenol 500mg-650mg every 6 hours for aches and pains, fevers. Hydrate very well with at least 2 liters of water. Eat light meals such as soups to replenish electrolytes and soft fruits, veggies. Start an antihistamine like Zyrtec for postnasal drainage, sinus congestion.  Use the cough medications as needed.  

## 2022-03-13 NOTE — ED Provider Notes (Signed)
[facility]  Note:  This document was prepared using Systems analyst and may include unintentional dictation errors.  MRN: [id number] DOB: 07-Jun-1995  Subjective:   Helen White is a 26 y.o. female presenting for 1 day history of acute onset persistent fever, body pains, nausea, vomiting, diarrhea, coughing.  No overt chest pain, shortness of breath or wheezing.  Patient is not opposed to COVID testing, would like Paxlovid should she test positive.  No current facility-administered medications for this encounter. No current outpatient medications on file.   Allergies  Allergen Reactions   Penicillins Hives    Has patient had a PCN reaction causing immediate rash, facial/tongue/throat swelling, SOB or lightheadedness with hypotension: No Has patient had a PCN reaction causing severe rash involving mucus membranes or skin necrosis: No Has patient had a PCN reaction that required hospitalization: No Has patient had a PCN reaction occurring within the last 10 years: No If all of the above answers are "NO", then may proceed with Cephalosporin use.     Past Medical History:  Diagnosis Date   Anemia    GERD (gastroesophageal reflux disease)    Migraine      Past Surgical History:  Procedure Laterality Date   NO PAST SURGERIES      Family History  Problem Relation Age of Onset   Healthy Mother    Healthy Father     Social History   Tobacco Use   Smoking status: Never   Smokeless tobacco: Never  Vaping Use   Vaping Use: Never used  Substance Use Topics   Alcohol use: No   Drug use: No    ROS   Objective:   Vitals: BP 115/77 (BP Location: Right Arm)   Pulse (!) 118   Temp 99.3 F (37.4 C) (Oral)   Resp 18   SpO2 97%   Wt Readings from Last 3 Encounters:  04/08/21 199 lb 1.6 oz (90.3 kg)  03/07/21 192 lb (87.1 kg)  01/08/20 205 lb 8 oz (93.2 kg)   Temp Readings from Last 3 Encounters:  03/13/22 99.3 F  (37.4 C) (Oral)  12/02/21 98.1 F (36.7 C) (Oral)  04/09/21 98 F (36.7 C) (Oral)   BP Readings from Last 3 Encounters:  03/13/22 115/77  12/02/21 100/69  04/09/21 100/71   Pulse Readings from Last 3 Encounters:  03/13/22 (!) 118  12/02/21 97  04/09/21 88   Physical Exam Constitutional:      General: She is not in acute distress.    Appearance: Normal appearance. She is well-developed and normal weight. She is not ill-appearing, toxic-appearing or diaphoretic.  HENT:     Head: Normocephalic and atraumatic.     Right Ear: Tympanic membrane, ear canal and external ear normal. No drainage or tenderness. No middle ear effusion. There is no impacted cerumen. Tympanic membrane is not erythematous or bulging.     Left Ear: Tympanic membrane, ear canal and external ear normal. No drainage or tenderness.  No middle ear effusion. There is no impacted cerumen. Tympanic membrane is not erythematous or bulging.     Nose: Nose normal. No congestion or rhinorrhea.     Mouth/Throat:     Mouth: Mucous membranes are moist. No oral lesions.     Pharynx: No pharyngeal swelling, oropharyngeal exudate, posterior oropharyngeal erythema or uvula swelling.     Tonsils: No tonsillar exudate or tonsillar abscesses.  Eyes:     General: No scleral icterus.  Right eye: No discharge.        Left eye: No discharge.     Extraocular Movements: Extraocular movements intact.     Right eye: Normal extraocular motion.     Left eye: Normal extraocular motion.     Conjunctiva/sclera: Conjunctivae normal.  Cardiovascular:     Rate and Rhythm: Regular rhythm. Tachycardia present.     Heart sounds: Normal heart sounds. No murmur heard.    No friction rub. No gallop.  Pulmonary:     Effort: Pulmonary effort is normal. No respiratory distress.     Breath sounds: No stridor. No wheezing, rhonchi or rales.  Chest:     Chest wall: No tenderness.  Abdominal:     General: Bowel sounds are normal. There is no  distension.     Palpations: Abdomen is soft. There is no mass.     Tenderness: There is no abdominal tenderness. There is no right CVA tenderness, left CVA tenderness, guarding or rebound.  Musculoskeletal:     Cervical back: Normal range of motion and neck supple.  Lymphadenopathy:     Cervical: No cervical adenopathy.  Skin:    General: Skin is warm and dry.  Neurological:     General: No focal deficit present.     Mental Status: She is alert and oriented to person, place, and time.  Psychiatric:        Mood and Affect: Mood normal.        Behavior: Behavior normal.        Thought Content: Thought content normal.        Judgment: Judgment normal.      Assessment and Plan :   PDMP not reviewed this encounter.  1. Acute viral syndrome     No history of CKD.  Recommended patient take Paxlovid should she test positive.  Use supportive care otherwise. Deferred imaging given clear cardiopulmonary exam, hemodynamically stable vital signs.  Respiratory testing pending.  Counseled patient on potential for adverse effects with medications prescribed/recommended today, ER and return-to-clinic precautions discussed, patient verbalized understanding.    Wallis Bamberg, PA-C 03/13/22 1815

## 2022-06-14 ENCOUNTER — Encounter: Payer: Self-pay | Admitting: Neurology

## 2022-06-14 ENCOUNTER — Ambulatory Visit (INDEPENDENT_AMBULATORY_CARE_PROVIDER_SITE_OTHER): Payer: 59 | Admitting: Neurology

## 2022-06-14 VITALS — BP 105/66 | HR 89 | Ht 64.0 in | Wt 205.8 lb

## 2022-06-14 DIAGNOSIS — G43109 Migraine with aura, not intractable, without status migrainosus: Secondary | ICD-10-CM | POA: Insufficient documentation

## 2022-06-14 DIAGNOSIS — G43009 Migraine without aura, not intractable, without status migrainosus: Secondary | ICD-10-CM

## 2022-06-14 DIAGNOSIS — E538 Deficiency of other specified B group vitamins: Secondary | ICD-10-CM

## 2022-06-14 MED ORDER — ONDANSETRON 4 MG PO TBDP: 4.0000 mg | ORAL_TABLET | Freq: Three times a day (TID) | ORAL | 3 refills | Status: DC | PRN

## 2022-06-14 MED ORDER — RIZATRIPTAN BENZOATE 10 MG PO TBDP: 10.0000 mg | ORAL_TABLET | ORAL | 11 refills | Status: DC | PRN

## 2022-06-14 NOTE — Patient Instructions (Addendum)
Emergency management: Rizatriptan '10mg'$  (tab) and Ondansetron '4mg'$  (one tab). Take RIGHT at onset of migraine and can repeat both pills in 2 hours or later if needed.  Get an eye exam  Meds ordered this encounter  Medications   rizatriptan (MAXALT-MLT) 10 MG disintegrating tablet    Sig: Take 1 tablet (10 mg total) by mouth as needed for migraine. May repeat in 2 hours if needed    Dispense:  9 tablet    Refill:  11   ondansetron (ZOFRAN-ODT) 4 MG disintegrating tablet    Sig: Take 1-2 tablets (4-8 mg total) by mouth every 8 (eight) hours as needed.    Dispense:  30 tablet    Refill:  3    There is increased risk for stroke in women with migraine with aura and a contraindication for the combined contraceptive pill for use by women who have migraine with aura. The risk for women with migraine without aura is lower. However other risk factors like smoking are far more likely to increase stroke risk than migraine. There is a recommendation for no smoking and for the use of OCPs without estrogen such as progestogen only pills particularly for women with migraine with aura.Marland Kitchen People who have migraine headaches with auras may be 3 times more likely to have a stroke caused by a blood clot, compared to migraine patients who don't see auras. Women who take hormone-replacement therapy may be 30 percent more likely to suffer a clot-based stroke than women not taking medication containing estrogen. Other risk factors like smoking and high blood pressure may be  much more important.    Ondansetron Dissolving Tablets What is this medication? ONDANSETRON (on DAN se tron) prevents nausea and vomiting from chemotherapy, radiation, or surgery. It works by blocking substances in the body that may cause nausea or vomiting. It belongs to a group of medications called antiemetics. This medicine may be used for other purposes; ask your health care provider or pharmacist if you have questions. COMMON BRAND NAME(S):  Zofran ODT What should I tell my care team before I take this medication? They need to know if you have any of these conditions: Heart disease History of irregular heartbeat Liver disease Low levels of magnesium or potassium in the blood An unusual or allergic reaction to ondansetron, granisetron, other medications, foods, dyes, or preservatives Pregnant or trying to get pregnant Breast-feeding How should I use this medication? These tablets are made to dissolve in the mouth. Do not try to push the tablet through the foil backing. With dry hands, peel away the foil backing and gently remove the tablet. Place the tablet in the mouth and allow it to dissolve, then swallow. While you may take these tablets with water, it is not necessary to do so. Talk to your care team regarding the use of this medication in children. Special care may be needed. Overdosage: If you think you have taken too much of this medicine contact a poison control center or emergency room at once. NOTE: This medicine is only for you. Do not share this medicine with others. What if I miss a dose? If you miss a dose, take it as soon as you can. If it is almost time for your next dose, take only that dose. Do not take double or extra doses. What may interact with this medication? Do not take this medication with any of the following: Apomorphine Certain medications for fungal infections like fluconazole, itraconazole, ketoconazole, posaconazole, voriconazole Cisapride Dronedarone Pimozide Thioridazine  This medication may also interact with the following: Carbamazepine Certain medications for depression, anxiety, or psychotic disturbances Fentanyl Linezolid MAOIs like Carbex, Eldepryl, Marplan, Nardil, and Parnate Methylene blue (injected into a vein) Other medications that prolong the QT interval (cause an abnormal heart rhythm) like dofetilide, ziprasidone Phenytoin Rifampicin Tramadol This list may not describe  all possible interactions. Give your health care provider a list of all the medicines, herbs, non-prescription drugs, or dietary supplements you use. Also tell them if you smoke, drink alcohol, or use illegal drugs. Some items may interact with your medicine. What should I watch for while using this medication? Check with your care team as soon as you can if you have any sign of an allergic reaction. What side effects may I notice from receiving this medication? Side effects that you should report to your care team as soon as possible: Allergic reactions--skin rash, itching, hives, swelling of the face, lips, tongue, or throat Bowel blockage--stomach cramping, unable to have a bowel movement or pass gas, loss of appetite, vomiting Chest pain (angina)--pain, pressure, or tightness in the chest, neck, back, or arms Heart rhythm changes--fast or irregular heartbeat, dizziness, feeling faint or lightheaded, chest pain, trouble breathing Irritability, confusion, fast or irregular heartbeat, muscle stiffness, twitching muscles, sweating, high fever, seizure, chills, vomiting, diarrhea, which may be signs of serotonin syndrome Side effects that usually do not require medical attention (report to your care team if they continue or are bothersome): Constipation Diarrhea General discomfort and fatigue Headache This list may not describe all possible side effects. Call your doctor for medical advice about side effects. You may report side effects to FDA at 1-800-FDA-1088. Where should I keep my medication? Keep out of the reach of children and pets. Store between 2 and 30 degrees C (36 and 86 degrees F). Throw away any unused medication after the expiration date. NOTE: This sheet is a summary. It may not cover all possible information. If you have questions about this medicine, talk to your doctor, pharmacist, or health care provider.  2023 Elsevier/Gold Standard (2007-05-18 00:00:00)
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
## 2022-06-15 LAB — CBC WITH DIFFERENTIAL/PLATELET
Basophils Absolute: 0 10*3/uL (ref 0.0–0.2)
Basos: 0 %
EOS (ABSOLUTE): 0.1 10*3/uL (ref 0.0–0.4)
Eos: 2 %
Hematocrit: 41.5 % (ref 34.0–46.6)
Hemoglobin: 13.4 g/dL (ref 11.1–15.9)
Immature Grans (Abs): 0 10*3/uL (ref 0.0–0.1)
Immature Granulocytes: 0 %
Lymphocytes Absolute: 2.2 10*3/uL (ref 0.7–3.1)
Lymphs: 32 %
MCH: 27.7 pg (ref 26.6–33.0)
MCHC: 32.3 g/dL (ref 31.5–35.7)
MCV: 86 fL (ref 79–97)
Monocytes Absolute: 0.5 10*3/uL (ref 0.1–0.9)
Monocytes: 7 %
Neutrophils Absolute: 4 10*3/uL (ref 1.4–7.0)
Neutrophils: 59 %
Platelets: 317 10*3/uL (ref 150–450)
RBC: 4.83 x10E6/uL (ref 3.77–5.28)
RDW: 12.6 % (ref 11.7–15.4)
WBC: 6.9 10*3/uL (ref 3.4–10.8)

## 2022-06-15 LAB — COMPREHENSIVE METABOLIC PANEL
ALT: 24 IU/L (ref 0–32)
AST: 21 IU/L (ref 0–40)
Albumin/Globulin Ratio: 1.4 (ref 1.2–2.2)
Albumin: 4 g/dL (ref 4.0–5.0)
Alkaline Phosphatase: 84 IU/L (ref 44–121)
BUN/Creatinine Ratio: 13 (ref 9–23)
BUN: 7 mg/dL (ref 6–20)
Bilirubin Total: <0.2 mg/dL (ref 0.0–1.2)
CO2: 22 mmol/L (ref 20–29)
Calcium: 8.9 mg/dL (ref 8.7–10.2)
Chloride: 107 mmol/L — ABNORMAL HIGH (ref 96–106)
Creatinine, Ser: 0.54 mg/dL — ABNORMAL LOW (ref 0.57–1.00)
Globulin, Total: 2.8 g/dL (ref 1.5–4.5)
Glucose: 72 mg/dL (ref 70–99)
Potassium: 4 mmol/L (ref 3.5–5.2)
Sodium: 141 mmol/L (ref 134–144)
Total Protein: 6.8 g/dL (ref 6.0–8.5)
eGFR: 129 mL/min/{1.73_m2} (ref >59)

## 2022-06-15 LAB — TSH RFX ON ABNORMAL TO FREE T4: TSH: 1.5 u[IU]/mL (ref 0.450–4.500)

## 2023-05-17 ENCOUNTER — Ambulatory Visit
Admission: EM | Admit: 2023-05-17 | Discharge: 2023-05-17 | Disposition: A | Discharge status: Home / Self Care | Payer: 59 | Attending: Internal Medicine | Admitting: Internal Medicine

## 2023-05-17 DIAGNOSIS — J029 Acute pharyngitis, unspecified: Secondary | ICD-10-CM | POA: Diagnosis present

## 2023-05-17 LAB — POCT RAPID STREP A (OFFICE): Rapid Strep A Screen: NEGATIVE

## 2023-05-17 MED ORDER — IBUPROFEN 800 MG PO TABS
800.0000 mg | ORAL_TABLET | Freq: Once | ORAL | Status: AC
  Administered 2023-05-17: 800 mg via ORAL

## 2023-05-17 NOTE — ED Provider Notes (Signed)
 [facility]    CSN: [id number] Arrival date & time: 05/17/23  0917      History   Chief Complaint No chief complaint on file.   HPI Helen White is a 28 y.o. female.   Patient presents to urgent care for evaluation of sore throat and swollen lymph nodes to the anterior neck that started a few days ago.  She was sick last week with cough, congestion, and other cold symptoms but has since improved.  Sore throat remains and is worsened by coughing and swallowing.  Her daughter has additionally been sick with similar symptoms recently and is currently in kindergarten where she is frequently exposed to illnesses.  Patient is no longer coughing and denies shortness of breath, chest tightness, nausea, vomiting, diarrhea, rash, and fever/chills.  She has not noticed any changes in her voice sounds and is not currently taking any medications to help with symptoms.     Past Medical History:  Diagnosis Date   Anemia    GERD (gastroesophageal reflux disease)    Migraine     Patient Active Problem List   Diagnosis Date Noted   Migraine without aura and without status migrainosus, not intractable 06/14/2022   Migraine with aura and without status migrainosus, not intractable 06/14/2022   Pregnancy 04/08/2021   Encounter for elective induction of labor 01/08/2020    Past Surgical History:  Procedure Laterality Date   NO PAST SURGERIES      OB History     Gravida  3   Para  3   Term  3   Preterm      AB      Living  3      SAB      IAB      Ectopic      Multiple  0   Live Births  3            Home Medications    Prior to Admission medications   Medication Sig Start Date End Date Taking? Authorizing Provider  ondansetron  (ZOFRAN -ODT) 4 MG disintegrating tablet Take 1-2 tablets (4-8 mg total) by mouth every 8 (eight) hours as needed. Patient not taking: Reported on 05/17/2023 06/14/22   Ines Onetha NOVAK, MD  rizatriptan  (MAXALT -MLT) 10 MG  disintegrating tablet Take 1 tablet (10 mg total) by mouth as needed for migraine. May repeat in 2 hours if needed Patient not taking: Reported on 05/17/2023 06/14/22   Ines Onetha NOVAK, MD    Family History Family History  Problem Relation Age of Onset   Healthy Mother    Migraines Mother    Healthy Father    Migraines Sister     Social History Social History   Tobacco Use   Smoking status: Never   Smokeless tobacco: Never  Vaping Use   Vaping status: Never Used  Substance Use Topics   Alcohol use: No   Drug use: No     Allergies   Penicillins   Review of Systems Review of Systems Per HPI  Physical Exam Triage Vital Signs ED Triage Vitals  Encounter Vitals Group     BP 05/17/23 0922 117/87     Systolic BP Percentile --      Diastolic BP Percentile --      Pulse Rate 05/17/23 0922 (!) 106     Resp 05/17/23 0922 16     Temp 05/17/23 0922 98.3 F (36.8 C)     Temp Source 05/17/23 0922 Oral  SpO2 05/17/23 0922 97 %     Weight --      Height --      Head Circumference --      Peak Flow --      Pain Score 05/17/23 0925 5     Pain Loc --      Pain Education --      Exclude from Growth Chart --    No data found.  Updated Vital Signs BP 117/87 (BP Location: Right Arm)   Pulse (!) 106   Temp 98.3 F (36.8 C) (Oral)   Resp 16   LMP 05/14/2023   SpO2 97%   Visual Acuity Right Eye Distance:   Left Eye Distance:   Bilateral Distance:    Right Eye Near:   Left Eye Near:    Bilateral Near:     Physical Exam Vitals and nursing note reviewed.  Constitutional:      Appearance: She is not ill-appearing or toxic-appearing.  HENT:     Head: Normocephalic and atraumatic.     Jaw: There is normal jaw occlusion.     Right Ear: Hearing, tympanic membrane, ear canal and external ear normal.     Left Ear: Hearing, tympanic membrane, ear canal and external ear normal.     Nose: Nose normal.     Mouth/Throat:     Lips: Pink.     Mouth: Mucous membranes are  moist. No injury or oral lesions.     Dentition: Normal dentition.     Tongue: No lesions.     Pharynx: Oropharynx is clear. Uvula midline. Posterior oropharyngeal erythema present. No pharyngeal swelling, oropharyngeal exudate, uvula swelling or postnasal drip.     Tonsils: No tonsillar exudate.     Comments: Bilateral erythema to the tonsillar pillars with minimal posterior oropharyngeal swelling. No trismus, phonation normal, maintaining secretions without difficulty.   Eyes:     General: Lids are normal. Vision grossly intact. Gaze aligned appropriately.     Extraocular Movements: Extraocular movements intact.     Conjunctiva/sclera: Conjunctivae normal.  Neck:     Thyroid: No thyroid mass, thyromegaly or thyroid tenderness.     Trachea: Trachea and phonation normal.     Comments: Normal thyroid exam. No masses or thyromegaly.  Cardiovascular:     Rate and Rhythm: Normal rate and regular rhythm.     Heart sounds: Normal heart sounds, S1 normal and S2 normal.  Pulmonary:     Effort: Pulmonary effort is normal. No respiratory distress.     Breath sounds: Normal breath sounds and air entry.  Musculoskeletal:     Cervical back: Neck supple.  Lymphadenopathy:     Cervical: Cervical adenopathy present.     Right cervical: Superficial cervical adenopathy and deep cervical adenopathy present.     Left cervical: Superficial cervical adenopathy and deep cervical adenopathy present.  Skin:    General: Skin is warm and dry.     Capillary Refill: Capillary refill takes less than 2 seconds.     Findings: No rash.  Neurological:     General: No focal deficit present.     Mental Status: She is alert and oriented to person, place, and time. Mental status is at baseline.     Cranial Nerves: No dysarthria or facial asymmetry.  Psychiatric:        Mood and Affect: Mood normal.        Speech: Speech normal.        Behavior: Behavior normal.  Thought Content: Thought content normal.         Judgment: Judgment normal.      UC Treatments / Results  Labs (all labs ordered are listed, but only abnormal results are displayed) Labs Reviewed  CULTURE, GROUP A STREP [facility])  POCT RAPID STREP A (OFFICE)    EKG   Radiology No results found.  Procedures Procedures (including critical care time)  Medications Ordered in UC Medications  ibuprofen  (ADVIL ) tablet 800 mg (800 mg Oral Given 05/17/23 1006)    Initial Impression / Assessment and Plan / UC Course  I have reviewed the triage vital signs and the nursing notes.  Pertinent labs & imaging results that were available during my care of the patient were reviewed by me and considered in my medical decision making (see chart for details).   1. Viral pharyngitis Evaluation suggests viral pharyngitis etiology.   Group A strep POC testing negative, throat culture pending, will treat based on throat culture results for bacterial pharyngitis if indicated.  Low suspicion for mononucleosis, epiglottitis, peritonsillar abscess, etc.  HEENT exam stable without red flag signs. Will manage this conservatively with supportive care as outlined in AVS.  Salt water gargles as needed, warm water with honey.   Counseled patient on potential for adverse effects with medications prescribed/recommended today, strict ER and return-to-clinic precautions discussed, patient verbalized understanding.    Final Clinical Impressions(s) / UC Diagnoses   Final diagnoses:  Viral pharyngitis     Discharge Instructions      Strep test in the clinic is negative, staff will call you if the throat culture is positive for bacteria in the next 2-3 days and call in treatment if necessary based on result.   For now, we will treat this as a viral infection with the following: - Over the counter medicines as needed for pain and swelling of the throat (Ibuprofen  600mg  and/or Tylenol  1,000mg  every 6 hours as needed) - 1 tablespoon of honey in warm water  and/or salt water gargles every 3-4 hours  Seek medical care if you develop changes to your voice, inability to swallow, drooling, or any new symptoms. If symptoms are severe, please go to the ER. I hope you feel better!       ED Prescriptions   None    PDMP not reviewed this encounter.   Enedelia Dorna HERO, [place] 05/17/23 1022

## 2023-05-17 NOTE — Discharge Instructions (Signed)
 Strep test in the clinic is negative, staff will call you if the throat culture is positive for bacteria in the next 2-3 days and call in treatment if necessary based on result.   For now, we will treat this as a viral infection with the following: - Over the counter medicines as needed for pain and swelling of the throat (Ibuprofen 600mg  and/or Tylenol 1,000mg  every 6 hours as needed) - 1 tablespoon of honey in warm water and/or salt water gargles every 3-4 hours  Seek medical care if you develop changes to your voice, inability to swallow, drooling, or any new symptoms. If symptoms are severe, please go to the ER. I hope you feel better!

## 2023-05-17 NOTE — ED Triage Notes (Signed)
 Pt states she thinks her thyroid is swollen. She was sick last week but feels better now.  States it is painful when she swallows on right side.

## 2023-05-18 ENCOUNTER — Ambulatory Visit: Payer: 59

## 2023-05-19 LAB — CULTURE, GROUP A STREP (THRC)

## 2023-05-21 ENCOUNTER — Telehealth (HOSPITAL_COMMUNITY): Payer: Self-pay

## 2023-05-21 MED ORDER — AZITHROMYCIN 250 MG PO TABS: ORAL_TABLET | ORAL | 0 refills | Status: AC

## 2023-05-21 NOTE — Telephone Encounter (Signed)
 Pt reports continued symptoms.  Per protocol, pt to receive treatment with Azithromycin .  Reviewed with patient, verified pharmacy, prescription sent
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
## 2023-06-20 ENCOUNTER — Telehealth: Payer: Self-pay | Admitting: Neurology

## 2023-06-20 ENCOUNTER — Telehealth: Payer: 59 | Admitting: Adult Health

## 2023-06-20 NOTE — Telephone Encounter (Signed)
 Noted thanks

## 2023-06-20 NOTE — Telephone Encounter (Signed)
 ..  Pt understands that although there may be some limitations with this type of visit, we will take all precautions to reduce any security or privacy concerns.  Pt understands that this will be treated like an in office visit and we will file with pt's insurance, and there may be a patient responsible charge related to this service. ? ?

## 2023-09-13 ENCOUNTER — Ambulatory Visit: Admission: EM | Admit: 2023-09-13 | Discharge: 2023-09-13 | Disposition: A | Discharge status: Home / Self Care

## 2023-09-13 ENCOUNTER — Encounter: Payer: Self-pay | Admitting: Emergency Medicine

## 2023-09-13 DIAGNOSIS — J029 Acute pharyngitis, unspecified: Secondary | ICD-10-CM | POA: Diagnosis not present

## 2023-09-13 DIAGNOSIS — H9201 Otalgia, right ear: Secondary | ICD-10-CM | POA: Diagnosis not present

## 2023-09-13 DIAGNOSIS — R519 Headache, unspecified: Secondary | ICD-10-CM | POA: Insufficient documentation

## 2023-09-13 LAB — POCT RAPID STREP A (OFFICE): Rapid Strep A Screen: NEGATIVE

## 2023-09-13 MED ORDER — CEPHALEXIN 500 MG PO CAPS: 500.0000 mg | ORAL_CAPSULE | Freq: Two times a day (BID) | ORAL | 0 refills | Status: AC

## 2023-09-13 MED ORDER — PREDNISONE 20 MG PO TABS: 40.0000 mg | ORAL_TABLET | Freq: Every day | ORAL | 0 refills | Status: AC

## 2023-09-13 NOTE — ED Triage Notes (Signed)
 Pt presents with sore throat and ear pain x 3 days with some neck swelling. Pt denies cough.

## 2023-09-13 NOTE — Discharge Instructions (Signed)
 You were seen today for a sore throat. On exam, your throat appears red but not severely swollen, and there are no signs of pus or drainage. Your right ear looks normal and does not show signs of infection. A rapid strep test was performed today and was negative; a throat culture has been sent to confirm the results due to your history of a Group B strep infection earlier this year.  You have been prescribed cephalexin  to begin treatment today. To help with throat discomfort, you can sip warm liquids like broth, tea, or warm water. Cold or frozen drinks and snacks, like ice pops, can also soothe your throat. Gargling with warm salt water three to four times a day may provide relief, and sucking on hard candy or throat lozenges can help as well. Using a cool-mist humidifier at night can keep the air moist and make breathing easier. Tylenol  or ibuprofen  can be taken if you have any pain or fever.  Be sure to drink plenty of fluids to stay well hydrated. Once you have finished your medication and your symptoms have improved, remember to replace your toothbrush to help prevent re-infection.   Follow-up with your primary care provider within 48 to 72 hours, or if you experience worsening throat pain, difficulty swallowing, persistent headaches, or swelling of the neck.  Go to the emergency room if you develop trouble breathing, severe pain, high fever, rash, swelling of the face or throat, or feel faint or weak.

## 2023-09-13 NOTE — ED Provider Notes (Signed)
 [facility]    CSN: [id number] Arrival date & time: 09/13/23  1704      History   Chief Complaint Chief Complaint  Patient presents with   Otalgia   Sore Throat    HPI Helen White is a 28 y.o. female.   Helen White is a 28 y.o. female that presents with complaints of sore throat and right ear pain. She reports a history of ear infection approximately 3 weeks ago, which she managed with Tylenol . The patient states that her current symptoms began about 3 days ago when she noticed throat pain and swelling. Today, the right ear pain returned, which is in the same ear that was affected 3 weeks ago. She describes swelling in her throat that causes pain when swallowing. The patient also reports a headache for the past 2 days. She denies fever, chills, body aches, runny nose, congestion, sneezing, cough, nausea, or vomiting. The patient mentions having normal spring allergies but nothing out of the ordinary. This is the third recent occurrence of similar symptoms for the patient. During a previous episode in February, she visited an Urgent Care where she tested negative for strep throat but throat culture was positive for Group B strep in which she was prescribed azithromycin  (Z-Pack). The patient reports that the antibiotic did not help, and Advil  provided more relief.  The following portions of the patient's history were reviewed and updated as appropriate: allergies, current medications, past family history, past medical history, past social history, past surgical history, and problem list.    Past Medical History:  Diagnosis Date   Anemia    GERD (gastroesophageal reflux disease)    Migraine     Patient Active Problem List   Diagnosis Date Noted   Vaginal delivery 09/13/2023   Migraine without aura and without status migrainosus, not intractable 06/14/2022   Migraine with aura and without status migrainosus, not intractable 06/14/2022   Pregnancy  04/08/2021   Encounter for elective induction of labor 01/08/2020    Past Surgical History:  Procedure Laterality Date   NO PAST SURGERIES      OB History     Gravida  3   Para  3   Term  3   Preterm      AB      Living  3      SAB      IAB      Ectopic      Multiple  0   Live Births  3            Home Medications    Prior to Admission medications   Medication Sig Start Date End Date Taking? Authorizing Provider  cephALEXin  (KEFLEX ) 500 MG capsule Take 1 capsule (500 mg total) by mouth 2 (two) times daily for 10 days. 09/13/23 09/23/23 Yes Perseus Westall, FNP  levonorgestrel (MIRENA, 52 MG,) 20 MCG/DAY IUD PROVIDED BY [facility]ARE [facility] 04/26/22  Yes [provider]  predniSONE  (DELTASONE ) 20 MG tablet Take 2 tablets (40 mg total) by mouth daily for 5 days. 09/13/23 09/18/23 Yes Maryruth Sol, FNP  cetirizine  (ZYRTEC ) 10 MG tablet Take 1 tablet by mouth daily.    [provider]  ipratropium (ATROVENT ) 0.03 % nasal spray USE 2 SPRAY(S) IN EACH NOSTRIL TWICE DAILY    [provider]  loperamide  (IMODIUM ) 2 MG capsule TAKE 1 CAPSULE BY MOUTH TWICE DAILY AS NEEDED FOR DIARRHEA OR LOOSE STOOLS.    [provider]  ondansetron  (ZOFRAN -ODT)  4 MG disintegrating tablet Take 1-2 tablets (4-8 mg total) by mouth every 8 (eight) hours as needed. Patient not taking: Reported on 05/17/2023 06/14/22   Glory Larsen, MD  rizatriptan  (MAXALT -MLT) 10 MG disintegrating tablet Take 1 tablet (10 mg total) by mouth as needed for migraine. May repeat in 2 hours if needed Patient not taking: Reported on 05/17/2023 06/14/22   Glory Larsen, MD    Family History Family History  Problem Relation Age of Onset   Healthy Mother    Migraines Mother    Healthy Father    Migraines Sister     Social History Social History   Tobacco Use   Smoking status: Never   Smokeless tobacco: Never  Vaping Use   Vaping status: Never Used  Substance Use Topics    Alcohol use: No   Drug use: No     Allergies   Penicillins   Review of Systems Review of Systems  Constitutional:  Negative for chills and fever.  HENT:  Positive for ear pain (right) and sore throat. Negative for congestion, rhinorrhea, sneezing and trouble swallowing.   Respiratory:  Negative for cough.   Gastrointestinal:  Negative for nausea and vomiting.  Musculoskeletal:  Negative for myalgias.  Neurological:  Positive for headaches.  All other systems reviewed and are negative.    Physical Exam Triage Vital Signs ED Triage Vitals  Encounter Vitals Group     BP 09/13/23 1723 114/64     Systolic BP Percentile --      Diastolic BP Percentile --      Pulse Rate 09/13/23 1723 94     Resp 09/13/23 1723 18     Temp 09/13/23 1723 98.1 F (36.7 C)     Temp Source 09/13/23 1723 Oral     SpO2 09/13/23 1723 98 %     Weight 09/13/23 1722 205 lb 14.6 oz (93.4 kg)     Height --      Head Circumference --      Peak Flow --      Pain Score 09/13/23 1719 5     Pain Loc --      Pain Education --      Exclude from Growth Chart --    No data found.  Updated Vital Signs BP 114/64 (BP Location: Left Arm)   Pulse 94   Temp 98.1 F (36.7 C) (Oral)   Resp 18   Wt 205 lb 14.6 oz (93.4 kg)   LMP  (LMP Unknown)   SpO2 98%   BMI 35.34 kg/m   Visual Acuity Right Eye Distance:   Left Eye Distance:   Bilateral Distance:    Right Eye Near:   Left Eye Near:    Bilateral Near:     Physical Exam Vitals reviewed.  Constitutional:      General: She is awake. She is not in acute distress.    Appearance: Normal appearance. She is well-developed, well-groomed and normal weight. She is not ill-appearing, toxic-appearing or diaphoretic.  HENT:     Head: Normocephalic.     Right Ear: Hearing, tympanic membrane, ear canal and external ear normal. No drainage, swelling or tenderness. No mastoid tenderness. Tympanic membrane is not erythematous.     Left Ear: Hearing, tympanic  membrane, ear canal and external ear normal. No drainage, swelling or tenderness. Tympanic membrane is not erythematous.     Nose: Nose normal.     Mouth/Throat:     Lips: Pink.  Mouth: Mucous membranes are moist.     Pharynx: Oropharynx is clear. Uvula midline. Posterior oropharyngeal erythema present. No pharyngeal swelling, oropharyngeal exudate or uvula swelling.     Tonsils: No tonsillar exudate or tonsillar abscesses.  Neck:     Thyroid: No thyroid mass, thyromegaly or thyroid tenderness.     Trachea: Trachea and phonation normal.  Cardiovascular:     Rate and Rhythm: Normal rate.  Pulmonary:     Effort: Pulmonary effort is normal. No tachypnea.     Breath sounds: Normal breath sounds and air entry. No decreased air movement. No decreased breath sounds.  Musculoskeletal:        General: Normal range of motion.     Cervical back: Full passive range of motion without pain, normal range of motion and neck supple.  Lymphadenopathy:     Cervical: Cervical adenopathy present.     Right cervical: Deep cervical adenopathy present. No superficial or posterior cervical adenopathy.    Left cervical: No superficial, deep or posterior cervical adenopathy.  Skin:    General: Skin is warm and dry.  Neurological:     General: No focal deficit present.     Mental Status: She is alert and oriented to person, place, and time.  Psychiatric:        Mood and Affect: Mood normal.        Behavior: Behavior normal. Behavior is cooperative.      UC Treatments / Results  Labs (all labs ordered are listed, but only abnormal results are displayed) Labs Reviewed  POCT RAPID STREP A (OFFICE) - Normal  CULTURE, GROUP A STREP [facility])    EKG   Radiology No results found.  Procedures Procedures (including critical care time)  Medications Ordered in UC Medications - No data to display  Initial Impression / Assessment and Plan / UC Course  I have reviewed the triage vital signs and the  nursing notes.  Pertinent labs & imaging results that were available during my care of the patient were reviewed by me and considered in my medical decision making (see chart for details).     Patient presents with sore throat for the past three days and new onset of right ear pain today. This is the third episode of similar symptoms in recent weeks, with a suspected ear infection three weeks ago that resolved with Tylenol . Current complaints include throat swelling, pain with swallowing, and headache for two days. The patient denies fever, chills, systemic symptoms, or respiratory complaints. On exam, there is throat erythema without significant swelling or exudate, and the right ear appears normal with no signs of infection. A rapid strep test was performed and results were negative. Given the patient's history of Group B strep treated with azithromycin  earlier this year, a throat culture will be sent. Will empirically treat today with cephalexin  given history of PCN allergy. Will treat with cephalexin  due to reported penicillin allergy. Cephalosporin use is appropriate as the reaction did not involve an immediate rash, facial or throat swelling, respiratory distress, hypotension, or severe mucocutaneous symptoms. Supportive care measures and indications for follow-up reviewed with patient.   Today's evaluation has revealed no signs of a dangerous process. Discussed diagnosis with patient and/or guardian. Patient and/or guardian aware of their diagnosis, possible red flag symptoms to watch out for and need for close follow up. Patient and/or guardian understands verbal and written discharge instructions. Patient and/or guardian comfortable with plan and disposition.  Patient and/or guardian has a clear mental status  at this time, good insight into illness (after discussion and teaching) and has clear judgment to make decisions regarding their care  Documentation was completed with the aid of voice  recognition software. Transcription may contain typographical errors.  Final Clinical Impressions(s) / UC Diagnoses   Final diagnoses:  Acute sore throat     Discharge Instructions      You were seen today for a sore throat. On exam, your throat appears red but not severely swollen, and there are no signs of pus or drainage. Your right ear looks normal and does not show signs of infection. A rapid strep test was performed today and was negative; a throat culture has been sent to confirm the results due to your history of a Group B strep infection earlier this year.  You have been prescribed cephalexin  to begin treatment today. To help with throat discomfort, you can sip warm liquids like broth, tea, or warm water. Cold or frozen drinks and snacks, like ice pops, can also soothe your throat. Gargling with warm salt water three to four times a day may provide relief, and sucking on hard candy or throat lozenges can help as well. Using a cool-mist humidifier at night can keep the air moist and make breathing easier. Tylenol  or ibuprofen  can be taken if you have any pain or fever.  Be sure to drink plenty of fluids to stay well hydrated. Once you have finished your medication and your symptoms have improved, remember to replace your toothbrush to help prevent re-infection.   Follow-up with your primary care provider within 48 to 72 hours, or if you experience worsening throat pain, difficulty swallowing, persistent headaches, or swelling of the neck.  Go to the emergency room if you develop trouble breathing, severe pain, high fever, rash, swelling of the face or throat, or feel faint or weak.    ED Prescriptions     Medication Sig Dispense Auth. Provider   cephALEXin  (KEFLEX ) 500 MG capsule Take 1 capsule (500 mg total) by mouth 2 (two) times daily for 10 days. 20 capsule Maryruth Sol, FNP   predniSONE  (DELTASONE ) 20 MG tablet Take 2 tablets (40 mg total) by mouth daily for 5 days. 10  tablet Maryruth Sol, FNP      PDMP not reviewed this encounter.   Maryruth Sol, [place] 09/13/23 [phone]

## 2023-09-15 LAB — CULTURE, GROUP A STREP (THRC)

## 2023-09-17 ENCOUNTER — Ambulatory Visit (HOSPITAL_COMMUNITY): Payer: Self-pay

## 2024-03-09 ENCOUNTER — Telehealth: Admitting: Family

## 2024-03-09 DIAGNOSIS — J069 Acute upper respiratory infection, unspecified: Secondary | ICD-10-CM | POA: Diagnosis not present

## 2024-03-09 MED ORDER — PREDNISONE 10 MG (21) PO TBPK: ORAL_TABLET | ORAL | 0 refills | Status: DC

## 2024-03-09 MED ORDER — BENZONATATE 200 MG PO CAPS: 200.0000 mg | ORAL_CAPSULE | Freq: Two times a day (BID) | ORAL | 0 refills | Status: AC | PRN

## 2024-03-09 NOTE — Progress Notes (Signed)
 We are sorry that you are not feeling well.  Here is how we plan to help!  Based on your presentation I believe you most likely have A cough due to a virus.  This is called viral bronchitis and is best treated by rest, plenty of fluids and control of the cough.  You may use Ibuprofen or Tylenol as directed to help your symptoms.     In addition you may use A non-prescription cough medication called Robitussin DAC. Take 2 teaspoons every 8 hours or Delsym: take 2 teaspoons every 12 hours., A non-prescription cough medication called Mucinex  DM: take 2 tablets every 12 hours., and A prescription cough medication called Tessalon  Perles 100mg . You may take 1-2 capsules every 8 hours as needed for your cough.  Prednisone  10 mg daily for 6 days (see taper instructions below)  Directions for 6 day taper: Day 1: 2 tablets before breakfast, 1 after both lunch & dinner and 2 at bedtime Day 2: 1 tab before breakfast, 1 after both lunch & dinner and 2 at bedtime Day 3: 1 tab at each meal & 1 at bedtime Day 4: 1 tab at breakfast, 1 at lunch, 1 at bedtime Day 5: 1 tab at breakfast & 1 tab at bedtime Day 6: 1 tab at breakfast  From your responses in the eVisit questionnaire you describe inflammation in the upper respiratory tract which is causing a significant cough.  This is commonly called Bronchitis and has four common causes:   Allergies Viral Infections Acid Reflux Bacterial Infection Allergies, viruses and acid reflux are treated by controlling symptoms or eliminating the cause. An example might be a cough caused by taking certain blood pressure medications. You stop the cough by changing the medication. Another example might be a cough caused by acid reflux. Controlling the reflux helps control the cough.  USE OF BRONCHODILATOR (RESCUE) INHALERS: There is a risk from using your bronchodilator too frequently.  The risk is that over-reliance on a medication which only relaxes the muscles surrounding  the breathing tubes can reduce the effectiveness of medications prescribed to reduce swelling and congestion of the tubes themselves.  Although you feel brief relief from the bronchodilator inhaler, your asthma may actually be worsening with the tubes becoming more swollen and filled with mucus.  This can delay other crucial treatments, such as oral steroid medications. If you need to use a bronchodilator inhaler daily, several times per day, you should discuss this with your provider.  There are probably better treatments that could be used to keep your asthma under control.     HOME CARE Only take medications as instructed by your medical team. Complete the entire course of an antibiotic. Drink plenty of fluids and get plenty of rest. Avoid close contacts especially the very young and the elderly Cover your mouth if you cough or cough into your sleeve. Always remember to wash your hands A steam or ultrasonic humidifier can help congestion.   GET HELP RIGHT AWAY IF: You develop worsening fever. You become short of breath You cough up blood. Your symptoms persist after you have completed your treatment plan MAKE SURE YOU  Understand these instructions. Will watch your condition. Will get help right away if you are not doing well or get worse.  Your e-visit answers were reviewed by a board certified advanced clinical practitioner to complete your personal care plan.  Depending on the condition, your plan could have included both over the counter or prescription medications. If there  is a problem please reply  once you have received a response from your provider. Your safety is important to us .  If you have drug allergies check your prescription carefully.    You can use MyChart to ask questions about today's visit, request a non-urgent call back, or ask for a work or school excuse for 24 hours related to this e-Visit. If it has been greater than 24 hours you will need to follow up with your  provider, or enter a new e-Visit to address those concerns. You will get an e-mail in the next two days asking about your experience.  I hope that your e-visit has been valuable and will speed your recovery. Thank you for using e-visits.   I have spent 5 minutes in review of e-visit questionnaire, review and updating patient chart, medical decision making and response to patient.   Bari Learn, FNP

## 2024-03-14 ENCOUNTER — Emergency Department (HOSPITAL_COMMUNITY): Admission: EM | Admit: 2024-03-14 | Discharge: 2024-03-14 | Disposition: A | Discharge status: Home / Self Care

## 2024-03-14 ENCOUNTER — Ambulatory Visit: Admitting: Radiology

## 2024-03-14 ENCOUNTER — Encounter (HOSPITAL_COMMUNITY): Payer: Self-pay

## 2024-03-14 ENCOUNTER — Ambulatory Visit (HOSPITAL_COMMUNITY): Payer: Self-pay

## 2024-03-14 ENCOUNTER — Ambulatory Visit
Admission: EM | Admit: 2024-03-14 | Discharge: 2024-03-14 | Disposition: A | Discharge status: Home / Self Care | Attending: Emergency Medicine | Admitting: Emergency Medicine

## 2024-03-14 ENCOUNTER — Emergency Department (HOSPITAL_COMMUNITY)

## 2024-03-14 ENCOUNTER — Encounter: Payer: Self-pay | Admitting: Emergency Medicine

## 2024-03-14 DIAGNOSIS — N390 Urinary tract infection, site not specified: Secondary | ICD-10-CM | POA: Insufficient documentation

## 2024-03-14 DIAGNOSIS — R10A1 Flank pain, right side: Secondary | ICD-10-CM | POA: Diagnosis not present

## 2024-03-14 DIAGNOSIS — A499 Bacterial infection, unspecified: Secondary | ICD-10-CM | POA: Insufficient documentation

## 2024-03-14 DIAGNOSIS — J189 Pneumonia, unspecified organism: Secondary | ICD-10-CM | POA: Insufficient documentation

## 2024-03-14 DIAGNOSIS — R509 Fever, unspecified: Secondary | ICD-10-CM

## 2024-03-14 DIAGNOSIS — E871 Hypo-osmolality and hyponatremia: Secondary | ICD-10-CM | POA: Insufficient documentation

## 2024-03-14 LAB — CBC WITH DIFFERENTIAL/PLATELET
Basophils Absolute: 0.1 K/uL (ref 0.0–0.1)
Basophils Relative: 2 %
Eosinophils Absolute: 0.1 K/uL (ref 0.0–0.5)
Eosinophils Relative: 1 %
HCT: 44.5 % (ref 36.0–46.0)
Hemoglobin: 14.7 g/dL (ref 12.0–15.0)
Lymphocytes Relative: 18 %
Lymphs Abs: 1.1 K/uL (ref 0.7–4.0)
MCH: 28.4 pg (ref 26.0–34.0)
MCHC: 33 g/dL (ref 30.0–36.0)
MCV: 86.1 fL (ref 80.0–100.0)
Monocytes Absolute: 0.3 K/uL (ref 0.1–1.0)
Monocytes Relative: 5 %
Neutro Abs: 4.6 K/uL (ref 1.7–7.7)
Neutrophils Relative %: 74 %
Platelets: 227 K/uL (ref 150–400)
RBC: 5.17 MIL/uL — ABNORMAL HIGH (ref 3.87–5.11)
RDW: 13.2 % (ref 11.5–15.5)
WBC: 6.2 K/uL (ref 4.0–10.5)
nRBC: 0 % (ref 0.0–0.2)

## 2024-03-14 LAB — PROTIME-INR
INR: 1.1 (ref 0.8–1.2)
Prothrombin Time: 15.1 s (ref 11.4–15.2)

## 2024-03-14 LAB — COMPREHENSIVE METABOLIC PANEL WITH GFR
ALT: 95 U/L — ABNORMAL HIGH (ref 0–44)
AST: 60 U/L — ABNORMAL HIGH (ref 15–41)
Albumin: 3.5 g/dL (ref 3.5–5.0)
Alkaline Phosphatase: 93 U/L (ref 38–126)
Anion gap: 12 (ref 5–15)
BUN: 7 mg/dL (ref 6–20)
CO2: 19 mmol/L — ABNORMAL LOW (ref 22–32)
Calcium: 8.7 mg/dL — ABNORMAL LOW (ref 8.9–10.3)
Chloride: 103 mmol/L (ref 98–111)
Creatinine, Ser: 0.84 mg/dL (ref 0.44–1.00)
GFR, Estimated: >60 mL/min (ref >60)
Glucose, Bld: 108 mg/dL — ABNORMAL HIGH (ref 70–99)
Potassium: 3.5 mmol/L (ref 3.5–5.1)
Sodium: 134 mmol/L — ABNORMAL LOW (ref 135–145)
Total Bilirubin: 1 mg/dL (ref 0.0–1.2)
Total Protein: 7.7 g/dL (ref 6.5–8.1)

## 2024-03-14 LAB — POCT URINE DIPSTICK
Glucose, UA: NEGATIVE mg/dL
Nitrite, UA: NEGATIVE
POC PROTEIN,UA: 30 — AB
Spec Grav, UA: 1.02 (ref 1.010–1.025)
Urobilinogen, UA: >=8 U/dL — AB
pH, UA: 6 (ref 5.0–8.0)

## 2024-03-14 LAB — POC COVID19/FLU A&B COMBO
Covid Antigen, POC: NEGATIVE
Influenza A Antigen, POC: NEGATIVE
Influenza B Antigen, POC: NEGATIVE

## 2024-03-14 LAB — URINALYSIS, ROUTINE W REFLEX MICROSCOPIC
Bilirubin Urine: NEGATIVE
Glucose, UA: NEGATIVE mg/dL
Ketones, ur: 40 mg/dL — AB
Nitrite: NEGATIVE
Protein, ur: NEGATIVE mg/dL
Specific Gravity, Urine: <1.005 — ABNORMAL LOW (ref 1.005–1.030)
pH: 6 (ref 5.0–8.0)

## 2024-03-14 LAB — URINALYSIS, MICROSCOPIC (REFLEX): WBC, UA: >50 WBC/hpf (ref 0–5)

## 2024-03-14 LAB — POCT URINE PREGNANCY: Preg Test, Ur: NEGATIVE

## 2024-03-14 LAB — HCG, SERUM, QUALITATIVE: Preg, Serum: NEGATIVE

## 2024-03-14 LAB — LIPASE, BLOOD: Lipase: 28 U/L (ref 11–51)

## 2024-03-14 LAB — BILIRUBIN, DIRECT: Bilirubin, Direct: 0.3 mg/dL — ABNORMAL HIGH (ref 0.0–0.2)

## 2024-03-14 MED ORDER — CEFPODOXIME PROXETIL 200 MG PO TABS: 200.0000 mg | ORAL_TABLET | Freq: Two times a day (BID) | ORAL | 0 refills | Status: DC

## 2024-03-14 MED ORDER — AZITHROMYCIN 250 MG PO TABS: 250.0000 mg | ORAL_TABLET | Freq: Every day | ORAL | 0 refills | Status: DC

## 2024-03-14 MED ORDER — SODIUM CHLORIDE 0.9 % IV BOLUS
1000.0000 mL | Freq: Once | INTRAVENOUS | Status: AC
  Administered 2024-03-14: 1000 mL via INTRAVENOUS

## 2024-03-14 MED ORDER — ACETAMINOPHEN 325 MG PO TABS
975.0000 mg | ORAL_TABLET | Freq: Once | ORAL | Status: AC
  Administered 2024-03-14: 975 mg via ORAL

## 2024-03-14 NOTE — ED Provider Notes (Signed)
 [place] [facility] AT [facility] Provider Note   CSN: [id number] Arrival date & time: 03/14/24  1124     Patient presents with: No chief complaint on file.   Helen White is a 28 y.o. female.   HPI     Presents with some chills, body aches.  Mild congestion.  Initially had the symptoms in November.  Subsequently resolved but then started feeling ill again approximately on 12 3.  Body aches.  No significant shortness of breath.  No wheezing.  No significant cough that she can appreciate.  Slightly nauseous but no actually diarrhea.  No vomiting.   Previous medical history reviewed : Follow-up with urgent care today.  Found to have abnormal UA.  Sent into ED for further evaluation.   Prior to Admission medications   Medication Sig Start Date End Date Taking? Authorizing Provider  azithromycin  (ZITHROMAX ) 250 MG tablet Take 1 tablet (250 mg total) by mouth daily. Take first 2 tablets together, then 1 every day until finished. 03/14/24  Yes Simon Lavonia SAILOR, MD  cefpodoxime  (VANTIN ) 200 MG tablet Take 1 tablet (200 mg total) by mouth 2 (two) times daily for 12 days. 03/14/24 03/26/24 Yes Simon Lavonia SAILOR, MD  benzonatate  (TESSALON ) 200 MG capsule Take 1 capsule (200 mg total) by mouth 2 (two) times daily as needed for cough. 03/09/24   Lavell Lye A, FNP  cetirizine  (ZYRTEC ) 10 MG tablet Take 1 tablet by mouth daily.    [provider]  ipratropium (ATROVENT ) 0.03 % nasal spray USE 2 SPRAY(S) IN EACH NOSTRIL TWICE DAILY    [provider]  levonorgestrel (MIRENA, 52 MG,) 20 MCG/DAY IUD PROVIDED BY [facility]ARE [facility] 04/26/22   [provider]  loperamide  (IMODIUM ) 2 MG capsule TAKE 1 CAPSULE BY MOUTH TWICE DAILY AS NEEDED FOR DIARRHEA OR LOOSE STOOLS.    [provider]  ondansetron  (ZOFRAN -ODT) 4 MG disintegrating tablet Take 1-2 tablets (4-8 mg total) by mouth every 8 (eight) hours as needed. Patient not taking: Reported on  05/17/2023 06/14/22   Ines Onetha NOVAK, MD  predniSONE  (STERAPRED UNI-PAK 21 TAB) 10 MG (21) TBPK tablet Use as directed 03/09/24   Lavell Lye A, FNP  rizatriptan  (MAXALT -MLT) 10 MG disintegrating tablet Take 1 tablet (10 mg total) by mouth as needed for migraine. May repeat in 2 hours if needed Patient not taking: Reported on 05/17/2023 06/14/22   Ines Onetha NOVAK, MD    Allergies: Penicillins    Review of Systems  Constitutional:  Negative for chills and fever.  HENT:  Negative for ear pain and sore throat.   Eyes:  Negative for pain and visual disturbance.  Respiratory:  Negative for cough and shortness of breath.   Cardiovascular:  Negative for chest pain and palpitations.  Gastrointestinal:  Negative for abdominal pain and vomiting.  Genitourinary:  Negative for dysuria and hematuria.  Musculoskeletal:  Negative for arthralgias and back pain.  Skin:  Negative for color change and rash.  Neurological:  Negative for seizures and syncope.  All other systems reviewed and are negative.   Updated Vital Signs BP 96/71 (BP Location: Right Arm)   Pulse (!) 112   Temp 98.1 F (36.7 C)   Resp 18   SpO2 95%   Physical Exam Vitals and nursing note reviewed.  Constitutional:      General: She is not in acute distress.    Appearance: She is well-developed.  HENT:     Head: Normocephalic and atraumatic.  Eyes:     Conjunctiva/sclera: Conjunctivae normal.  Cardiovascular:     Rate and Rhythm: Normal rate and regular rhythm.     Heart sounds: No murmur heard. Pulmonary:     Effort: Pulmonary effort is normal. No respiratory distress.     Breath sounds: Normal breath sounds.  Abdominal:     Palpations: Abdomen is soft.     Tenderness: There is no abdominal tenderness.  Musculoskeletal:        General: No swelling.     Cervical back: Neck supple.  Skin:    General: Skin is warm and dry.     Capillary Refill: Capillary refill takes less than 2 seconds.  Neurological:     Mental  Status: She is alert.  Psychiatric:        Mood and Affect: Mood normal.     (all labs ordered are listed, but only abnormal results are displayed) Labs Reviewed  CBC WITH DIFFERENTIAL/PLATELET - Abnormal; Notable for the following components:      Result Value   RBC 5.17 (*)    All other components within normal limits  COMPREHENSIVE METABOLIC PANEL WITH GFR - Abnormal; Notable for the following components:   Sodium 134 (*)    CO2 19 (*)    Glucose, Bld 108 (*)    Calcium 8.7 (*)    AST 60 (*)    ALT 95 (*)    All other components within normal limits  URINALYSIS, ROUTINE W REFLEX MICROSCOPIC - Abnormal; Notable for the following components:   Specific Gravity, Urine <1.005 (*)    Hgb urine dipstick LARGE (*)    Ketones, ur 40 (*)    Leukocytes,Ua MODERATE (*)    All other components within normal limits  BILIRUBIN, DIRECT - Abnormal; Notable for the following components:   Bilirubin, Direct 0.3 (*)    All other components within normal limits  URINALYSIS, MICROSCOPIC (REFLEX) - Abnormal; Notable for the following components:   Bacteria, UA MANY (*)    All other components within normal limits  LIPASE, BLOOD  HCG, SERUM, QUALITATIVE  PROTIME-INR    EKG: EKG Interpretation Date/Time:  Friday March 14 2024 16:11:48 EST Ventricular Rate:  92 PR Interval:  172 QRS Duration:  92 QT Interval:  382 QTC Calculation: 472 R Axis:   74  Text Interpretation: Normal sinus rhythm Low voltage QRS Incomplete right bundle branch block Borderline ECG No previous ECGs available Confirmed by Simon Rea [phone]) on 03/14/2024 4:17:59 PM  Radiology: [facility] Chest 2 View Result Date: 03/14/2024 EXAM: 2 VIEW(S) XRAY OF THE CHEST 03/14/2024 10:26:43 AM COMPARISON: Portable chest x ray 07/02/2018. CLINICAL HISTORY: 28 year old female with fever and cough. FINDINGS: LUNGS AND PLEURA: Mild generalized increased pulmonary interstitium. No confluent lung opacity. No pleural effusion. No  pneumothorax. HEART AND MEDIASTINUM: No acute abnormality of the cardiac and mediastinal silhouettes. BONES AND SOFT TISSUES: No acute osseous abnormality. IMPRESSION: 1. Mild generalized increased pulmonary interstitium, suspicious for acute viral / atypical respiratory infection in this setting. Electronically signed by: Helayne Hurst MD 03/14/2024 10:59 AM EST RP Workstation: [id number]     Procedures   Medications Ordered in the ED  sodium chloride  0.9 % bolus 1,000 mL (0 mLs Intravenous Stopped 03/14/24 1508)  sodium chloride  0.9 % bolus 1,000 mL (1,000 mLs Intravenous New Bag/Given 03/14/24 1605)  Medical Decision Making Amount and/or Complexity of Data Reviewed Labs: ordered. Radiology: ordered.  Risk Prescription drug management.     HPI:  Presents with some chills, body aches.  Mild congestion.  Initially had the symptoms in November.  Subsequently resolved but then started feeling ill again approximately on 12 3.  Body aches.  No significant shortness of breath.  No wheezing.  No significant cough that she can appreciate.  Slightly nauseous but no actually diarrhea.  No vomiting.   Previous medical history reviewed : Follow-up with urgent care today.  Found to have abnormal UA.  Sent into ED for further evaluation.  MDM:   Upon exam, patient hemodynamically stable.  ANO x 3 with GCS 15.  No focal deficits.  Lung sounds clear.  No obvious rales rhonchi or wheezing.  Soft and benign abdomen.  No CVA tenderness.  Obtain basic laboratory workup.  Obtain CMP to check for any kind of elevated liver enzymes.  He has no pain in this area.  Obtain repeat UA.  Previous urine could be from dehydration.  Will give a liter of fluid as well.  Chest x-ray reviewed.  Probable viral component to the chest x-ray.  No indication for repeat chest x-ray here.  No chest pain or shortness of breath.  No concerns for PE.  Reevaluation:   Upon reexamination,  patient hemodynamically stable.  Remains A&O x 3 with GCS 15.  Reviewed x-ray from urgent care.  Question whether it could be atypical pneumonia.  Start patient on azithromycin  for this.  Mild elevation in LFTs.  Patient will have repeat laboratory workup in 1 week to make sure this resolves.  Unclear etiology.  No pain to palpation in right upper quadrant.  No rebound or guarding.  Could be reactive.  UA showed leuk esterase, bacteria as well as white blood cells and red blood cells.  Currently not on her menstrual cycle.  Reexamined the patient, she does have some slight right sided CVA tenderness.  Therefore, we will obtain CT scan to make sure there is no obvious nephrolithiasis on the side that could change management.  Will start patient on cephalosporin to cover for UTI as well as pyelonephritis though my concern for pyelonephritis is pretty low at this point time.  She has tolerated cephalosporins in the past.  History of penicillin allergy.  I think this is safe.  Patient be signed out pending CT scan.  Interventions: 2 L NS   EKG Interpreted by Me: sinus      I have independently interpreted the CXR images and agree with the radiologist finding   Social Determinant of Health: denies drugs/alcohol    Disposition and Follow Up: PCP       Final diagnoses:  Atypical pneumonia  UTI (urinary tract infection), bacterial    ED Discharge Orders          Ordered    azithromycin  (ZITHROMAX ) 250 MG tablet  Daily        03/14/24 1530    cefpodoxime  (VANTIN ) 200 MG tablet  2 times daily        03/14/24 1536               Simon Lavonia SAILOR, MD 03/14/24 1620

## 2024-03-14 NOTE — ED Provider Notes (Signed)
 Received patient in turnover from Dr. Simon.  Please see their note for further details of Hx, PE.  Briefly patient is a 28 y.o. female with a No chief complaint on file. .  Patient has atypical pneumonia may have a UTI.  Awaiting CT imaging.  Plan for likely home post CT. CT without obvious acute finding.  D/c home.  PCP follow up.     [name], DO 03/14/24 1700

## 2024-03-14 NOTE — Discharge Instructions (Signed)
Please go directly to ER.

## 2024-03-14 NOTE — Telephone Encounter (Signed)
 Chest x-ray is concerning for atypical pneumonia.  Patient is in the ED now for evaluation.  Will include ED provider and this communication.

## 2024-03-14 NOTE — ED Triage Notes (Signed)
 Pt sent from UC with concern for elevated urobilinogen and bilirubin, no hx of liver disease.  Pt reports feeling sick for a few days, thought she had the flu and then felt better for few days until Wednesday she started feeling worse, this time feels worse.

## 2024-03-14 NOTE — Discharge Instructions (Signed)
 Your urine was concerning for possible UTI.  Given your pain in the flank area as well as concerns for possible UTI, I will cover you with antibiotics to cover this.  Chest x-ray did show possible atypical pneumonia as well.  Given this, started on azithromycin .  Your liver enzymes were minimally high.  I think you should have repeat testing in the next week to make sure this normalizes or does not get worse.  If you have any, chest pain or shortness of breath please come back to see us .  If you have any kind of worsening fatigue fever or chills please go back to see us .

## 2024-03-14 NOTE — ED Provider Notes (Signed)
 [facility]    CSN: [id number] Arrival date & time: 03/14/24  0909      History   Chief Complaint Chief Complaint  Patient presents with   Fever    HPI Helen White is a 28 y.o. female.   Pt was sick 11/28-11/30 with fever, chills, body aches, congestion. Thought she must have the flu. Felt completely well, zero symptoms on 12/1 and 12/2. 12/3 developed fever, very mild nasal congestion, and body aches again. No SOB, wheezing. Pt does not smoke and does not have hx of respiratory disease.   Felt nauseated this morning, took a zofran  and felt slightly better but is still nauseated. No vomiting. No abd pain. Does have R flank pain. She has mirena IUD, does not get periods. No vaginal bleeding or discharge.    Fever   Past Medical History:  Diagnosis Date   Anemia    GERD (gastroesophageal reflux disease)    Migraine     Patient Active Problem List   Diagnosis Date Noted   Vaginal delivery 09/13/2023   Migraine without aura and without status migrainosus, not intractable 06/14/2022   Migraine with aura and without status migrainosus, not intractable 06/14/2022   Pregnancy 04/08/2021   Encounter for elective induction of labor 01/08/2020    Past Surgical History:  Procedure Laterality Date   NO PAST SURGERIES      OB History     Gravida  3   Para  3   Term  3   Preterm      AB      Living  3      SAB      IAB      Ectopic      Multiple  0   Live Births  3            Home Medications    Prior to Admission medications   Medication Sig Start Date End Date Taking? Authorizing Provider  benzonatate  (TESSALON ) 200 MG capsule Take 1 capsule (200 mg total) by mouth 2 (two) times daily as needed for cough. 03/09/24   Lavell Lye A, FNP  cetirizine  (ZYRTEC ) 10 MG tablet Take 1 tablet by mouth daily.    [provider]  ipratropium (ATROVENT ) 0.03 % nasal spray USE 2 SPRAY(S) IN EACH NOSTRIL TWICE DAILY    [provider]  levonorgestrel (MIRENA, 52 MG,) 20 MCG/DAY IUD PROVIDED BY [facility]ARE [facility] 04/26/22   [provider]  loperamide  (IMODIUM ) 2 MG capsule TAKE 1 CAPSULE BY MOUTH TWICE DAILY AS NEEDED FOR DIARRHEA OR LOOSE STOOLS.    [provider]  ondansetron  (ZOFRAN -ODT) 4 MG disintegrating tablet Take 1-2 tablets (4-8 mg total) by mouth every 8 (eight) hours as needed. Patient not taking: Reported on 05/17/2023 06/14/22   Ines Onetha NOVAK, MD  predniSONE  (STERAPRED UNI-PAK 21 TAB) 10 MG (21) TBPK tablet Use as directed 03/09/24   Lavell Lye A, FNP  rizatriptan  (MAXALT -MLT) 10 MG disintegrating tablet Take 1 tablet (10 mg total) by mouth as needed for migraine. May repeat in 2 hours if needed Patient not taking: Reported on 05/17/2023 06/14/22   Ines Onetha NOVAK, MD    Family History Family History  Problem Relation Age of Onset   Healthy Mother    Migraines Mother    Healthy Father    Migraines Sister     Social History Social History   Tobacco Use   Smoking status: Never   Smokeless tobacco: Never  Vaping Use   Vaping status: Never Used  Substance Use Topics   Alcohol use: No   Drug use: No     Allergies   Penicillins   Review of Systems Review of Systems  Constitutional:  Positive for fever.     Physical Exam Triage Vital Signs ED Triage Vitals  Encounter Vitals Group     BP 03/14/24 0932 (!) 135/106     Girls Systolic BP Percentile --      Girls Diastolic BP Percentile --      Boys Systolic BP Percentile --      Boys Diastolic BP Percentile --      Pulse Rate 03/14/24 0932 (!) 130     Resp 03/14/24 0932 17     Temp 03/14/24 0932 (!) 100.8 F (38.2 C)     Temp Source 03/14/24 0932 Oral     SpO2 03/14/24 0932 91 %     Weight --      Height --      Head Circumference --      Peak Flow --      Pain Score 03/14/24 0935 5     Pain Loc --      Pain Education --      Exclude from Growth Chart --    No data found.  Updated Vital Signs BP (!)  135/106 (BP Location: Right Arm)   Pulse (!) 122   Temp (!) 100.8 F (38.2 C) (Oral)   Resp 17   SpO2 93%   Visual Acuity Right Eye Distance:   Left Eye Distance:   Bilateral Distance:    Right Eye Near:   Left Eye Near:    Bilateral Near:     Physical Exam Constitutional:      Appearance: She is ill-appearing.  HENT:     Mouth/Throat:     Mouth: Mucous membranes are moist.     Pharynx: Oropharynx is clear. No oropharyngeal exudate or posterior oropharyngeal erythema.  Cardiovascular:     Rate and Rhythm: Regular rhythm. Tachycardia present.  Pulmonary:     Effort: Pulmonary effort is normal. No respiratory distress.     Breath sounds: Normal breath sounds.  Abdominal:     Tenderness: There is no abdominal tenderness. There is right CVA tenderness. There is no left CVA tenderness, guarding or rebound.  Musculoskeletal:     Cervical back: No rigidity.  Lymphadenopathy:     Cervical: No cervical adenopathy.  Neurological:     Mental Status: She is alert.      UC Treatments / Results  Labs (all labs ordered are listed, but only abnormal results are displayed) Labs Reviewed  POCT URINE DIPSTICK - Abnormal; Notable for the following components:      Result Value   Color, UA brown (*)    Clarity, UA cloudy (*)    Bilirubin, UA moderate (*)    Ketones, POC UA large (80) (*)    Blood, UA moderate (*)    POC PROTEIN,UA =30 (*)    Urobilinogen, UA >=8.0 (*)    Leukocytes, UA Small (1+) (*)    All other components within normal limits  POC COVID19/FLU A&B COMBO  POCT URINE PREGNANCY    EKG   Radiology No results found.  Procedures Procedures (including critical care time)  Medications Ordered in UC Medications  acetaminophen  (TYLENOL ) tablet 975 mg (975 mg Oral Given 03/14/24 1017)    Initial Impression / Assessment and Plan / UC Course  I  have reviewed the triage vital signs and the nursing notes.  Pertinent labs & imaging results that were available  during my care of the patient were reviewed by me and considered in my medical decision making (see chart for details).     SpO2 is low for someone without respiratory sx or respiratory disease hx. Urine concerning with elevated urobilinogen and bilirubin - no hx of liver disease. Urine also with RBCs, ketones. I recommend ED evaluation   Final Clinical Impressions(s) / UC Diagnoses   Final diagnoses:  Right flank pain  Fever, unspecified     Discharge Instructions      Please go directly to ER     ED Prescriptions   None    PDMP not reviewed this encounter.   Richad Jon HERO, NP 03/14/24 1051

## 2024-03-14 NOTE — ED Provider Triage Note (Signed)
 Emergency Medicine Provider Triage Evaluation Note  Elli Groesbeck [facility] , a 28 y.o. female  was evaluated in triage.  Pt complains of bodyaches.  Chills.  Maybe a mild runny nose.  No significant cough.  Endorsing nausea but no vomiting or diarrhea.  With urgent care today.  Found abnormal UA so sent here to the ED for further evaluation..  Review of Systems  Positive: N, chills Negative: CP/SOB  Physical Exam  BP 96/71 (BP Location: Right Arm)   Pulse (!) 112   Temp 98.1 F (36.7 C)   Resp 18   SpO2 95%  Gen:   Awake, no distress   Resp:  Normal effort  MSK:   Moves extremities without difficulty  Other:    Medical Decision Making  Medically screening exam initiated at 11:46 AM.  Appropriate orders placed.  Adna Helling Donaho was informed that the remainder of the evaluation will be completed by another provider, this initial triage assessment does not replace that evaluation, and the importance of remaining in the ED until their evaluation is complete.  Obtain basic laboratory workup.  Obtain UA.  Screening labs.  Liter of fluid.  No chest pain or shortness of breath at this time.  Obtain x-ray.   Simon Lavonia SAILOR, MD 03/14/24 [phone]

## 2024-03-14 NOTE — ED Triage Notes (Addendum)
 Pt states she got sick Friday last week with fever, cough, body aches, and nausea. She got better after 3 days. Then Wednesday she started having same symptoms again.

## 2024-03-18 ENCOUNTER — Inpatient Hospital Stay (HOSPITAL_COMMUNITY)
Admission: EM | Admit: 2024-03-18 | Discharge: 2024-03-25 | DRG: 866 | Disposition: A | Discharge status: Home / Self Care

## 2024-03-18 ENCOUNTER — Other Ambulatory Visit: Payer: Self-pay

## 2024-03-18 ENCOUNTER — Emergency Department (HOSPITAL_COMMUNITY)

## 2024-03-18 ENCOUNTER — Encounter (HOSPITAL_COMMUNITY): Payer: Self-pay | Admitting: Emergency Medicine

## 2024-03-18 ENCOUNTER — Telehealth: Admitting: Family Medicine

## 2024-03-18 DIAGNOSIS — K219 Gastro-esophageal reflux disease without esophagitis: Secondary | ICD-10-CM | POA: Diagnosis present

## 2024-03-18 DIAGNOSIS — R10A0 Flank pain, unspecified side: Secondary | ICD-10-CM

## 2024-03-18 DIAGNOSIS — N10 Acute tubulo-interstitial nephritis: Secondary | ICD-10-CM | POA: Diagnosis present

## 2024-03-18 DIAGNOSIS — Y92239 Unspecified place in hospital as the place of occurrence of the external cause: Secondary | ICD-10-CM | POA: Diagnosis not present

## 2024-03-18 DIAGNOSIS — Z975 Presence of (intrauterine) contraceptive device: Secondary | ICD-10-CM

## 2024-03-18 DIAGNOSIS — R509 Fever, unspecified: Secondary | ICD-10-CM | POA: Diagnosis present

## 2024-03-18 DIAGNOSIS — Z8701 Personal history of pneumonia (recurrent): Secondary | ICD-10-CM

## 2024-03-18 DIAGNOSIS — T360X5A Adverse effect of penicillins, initial encounter: Secondary | ICD-10-CM | POA: Diagnosis not present

## 2024-03-18 DIAGNOSIS — I959 Hypotension, unspecified: Secondary | ICD-10-CM | POA: Diagnosis present

## 2024-03-18 DIAGNOSIS — Z6835 Body mass index (BMI) 35.0-35.9, adult: Secondary | ICD-10-CM

## 2024-03-18 DIAGNOSIS — N12 Tubulo-interstitial nephritis, not specified as acute or chronic: Secondary | ICD-10-CM | POA: Diagnosis not present

## 2024-03-18 DIAGNOSIS — E860 Dehydration: Secondary | ICD-10-CM | POA: Diagnosis present

## 2024-03-18 DIAGNOSIS — E66812 Obesity, class 2: Secondary | ICD-10-CM | POA: Diagnosis present

## 2024-03-18 DIAGNOSIS — B349 Viral infection, unspecified: Principal | ICD-10-CM | POA: Diagnosis present

## 2024-03-18 DIAGNOSIS — G43909 Migraine, unspecified, not intractable, without status migrainosus: Secondary | ICD-10-CM | POA: Diagnosis present

## 2024-03-18 DIAGNOSIS — Z820 Family history of epilepsy and other diseases of the nervous system: Secondary | ICD-10-CM

## 2024-03-18 DIAGNOSIS — L500 Allergic urticaria: Secondary | ICD-10-CM | POA: Diagnosis not present

## 2024-03-18 DIAGNOSIS — E876 Hypokalemia: Secondary | ICD-10-CM | POA: Diagnosis present

## 2024-03-18 DIAGNOSIS — Z880 Allergy status to penicillin: Secondary | ICD-10-CM

## 2024-03-18 LAB — URINALYSIS, ROUTINE W REFLEX MICROSCOPIC
Glucose, UA: NEGATIVE mg/dL
Ketones, ur: >80 mg/dL — AB
Nitrite: NEGATIVE
Protein, ur: 30 mg/dL — AB
Specific Gravity, Urine: 1.025 (ref 1.005–1.030)
pH: 6 (ref 5.0–8.0)

## 2024-03-18 LAB — RESP PANEL BY RT-PCR (RSV, FLU A&B, COVID)  RVPGX2
Influenza A by PCR: NEGATIVE
Influenza B by PCR: NEGATIVE
Resp Syncytial Virus by PCR: NEGATIVE
SARS Coronavirus 2 by RT PCR: NEGATIVE

## 2024-03-18 LAB — LIPASE, BLOOD: Lipase: 25 U/L (ref 11–51)

## 2024-03-18 LAB — COMPREHENSIVE METABOLIC PANEL WITH GFR
ALT: 57 U/L — ABNORMAL HIGH (ref 0–44)
AST: 43 U/L — ABNORMAL HIGH (ref 15–41)
Albumin: 3.1 g/dL — ABNORMAL LOW (ref 3.5–5.0)
Alkaline Phosphatase: 73 U/L (ref 38–126)
Anion gap: 9 (ref 5–15)
BUN: 5 mg/dL — ABNORMAL LOW (ref 6–20)
CO2: 26 mmol/L (ref 22–32)
Calcium: 9 mg/dL (ref 8.9–10.3)
Chloride: 104 mmol/L (ref 98–111)
Creatinine, Ser: 0.8 mg/dL (ref 0.44–1.00)
GFR, Estimated: >60 mL/min (ref >60)
Glucose, Bld: 96 mg/dL (ref 70–99)
Potassium: 4.5 mmol/L (ref 3.5–5.1)
Sodium: 139 mmol/L (ref 135–145)
Total Bilirubin: 1 mg/dL (ref 0.0–1.2)
Total Protein: 7.3 g/dL (ref 6.5–8.1)

## 2024-03-18 LAB — URINALYSIS, MICROSCOPIC (REFLEX)

## 2024-03-18 LAB — CBC
HCT: 42.7 % (ref 36.0–46.0)
Hemoglobin: 14.3 g/dL (ref 12.0–15.0)
MCH: 28.5 pg (ref 26.0–34.0)
MCHC: 33.5 g/dL (ref 30.0–36.0)
MCV: 85.1 fL (ref 80.0–100.0)
Platelets: 230 K/uL (ref 150–400)
RBC: 5.02 MIL/uL (ref 3.87–5.11)
RDW: 13.2 % (ref 11.5–15.5)
WBC: 5.6 K/uL (ref 4.0–10.5)
nRBC: 0 % (ref 0.0–0.2)

## 2024-03-18 LAB — HCG, SERUM, QUALITATIVE: Preg, Serum: NEGATIVE

## 2024-03-18 MED ORDER — SODIUM CHLORIDE 0.9 % IV SOLN
2.0000 g | Freq: Once | INTRAVENOUS | Status: AC
  Administered 2024-03-18: 2 g via INTRAVENOUS
  Filled 2024-03-18: qty 20

## 2024-03-18 MED ORDER — LACTATED RINGERS IV SOLN: INTRAVENOUS | Status: AC

## 2024-03-18 MED ORDER — SODIUM CHLORIDE 0.9 % IV BOLUS
1000.0000 mL | Freq: Once | INTRAVENOUS | Status: AC
  Administered 2024-03-18: 1000 mL via INTRAVENOUS

## 2024-03-18 MED ORDER — LACTATED RINGERS IV BOLUS (SEPSIS)
1000.0000 mL | Freq: Once | INTRAVENOUS | Status: AC
  Administered 2024-03-18: 1000 mL via INTRAVENOUS

## 2024-03-18 MED ORDER — SODIUM CHLORIDE 0.9 % IV SOLN: INTRAVENOUS | Status: AC

## 2024-03-18 MED ORDER — ENOXAPARIN SODIUM 40 MG/0.4ML IJ SOSY
40.0000 mg | PREFILLED_SYRINGE | INTRAMUSCULAR | Status: DC
  Administered 2024-03-18: 40 mg via SUBCUTANEOUS
  Filled 2024-03-18 (×5): qty 0.4

## 2024-03-18 MED ORDER — ONDANSETRON 4 MG PO TBDP
4.0000 mg | ORAL_TABLET | Freq: Once | ORAL | Status: AC | PRN
  Administered 2024-03-18: 4 mg via ORAL
  Filled 2024-03-18: qty 1

## 2024-03-18 MED ORDER — ACETAMINOPHEN 500 MG PO TABS
1000.0000 mg | ORAL_TABLET | Freq: Once | ORAL | Status: AC
  Administered 2024-03-18: 1000 mg via ORAL
  Filled 2024-03-18: qty 2

## 2024-03-18 MED ORDER — ACETAMINOPHEN 650 MG RE SUPP
650.0000 mg | Freq: Four times a day (QID) | RECTAL | Status: DC | PRN
  Filled 2024-03-18: qty 1

## 2024-03-18 MED ORDER — OXYCODONE HCL 5 MG PO TABS: 5.0000 mg | ORAL_TABLET | ORAL | Status: DC | PRN

## 2024-03-18 MED ORDER — SODIUM CHLORIDE 0.9 % IV SOLN
2.0000 g | INTRAVENOUS | Status: DC
  Administered 2024-03-19 – 2024-03-20 (×2): 2 g via INTRAVENOUS
  Filled 2024-03-18 (×2): qty 20

## 2024-03-18 MED ORDER — IOHEXOL 350 MG/ML SOLN
75.0000 mL | Freq: Once | INTRAVENOUS | Status: AC | PRN
  Administered 2024-03-18: 75 mL via INTRAVENOUS

## 2024-03-18 MED ORDER — SODIUM CHLORIDE 0.9 % IV SOLN: 2.0000 g | Freq: Once | INTRAVENOUS | Status: DC

## 2024-03-18 MED ORDER — ACETAMINOPHEN 325 MG PO TABS
650.0000 mg | ORAL_TABLET | Freq: Four times a day (QID) | ORAL | Status: DC | PRN
  Administered 2024-03-18 – 2024-03-25 (×22): 650 mg via ORAL
  Filled 2024-03-18 (×24): qty 2

## 2024-03-18 MED ORDER — ONDANSETRON HCL 4 MG/2ML IJ SOLN
4.0000 mg | Freq: Four times a day (QID) | INTRAMUSCULAR | Status: DC | PRN
  Administered 2024-03-19 – 2024-03-22 (×5): 4 mg via INTRAVENOUS
  Filled 2024-03-18 (×5): qty 2

## 2024-03-18 MED ORDER — ONDANSETRON HCL 4 MG PO TABS
4.0000 mg | ORAL_TABLET | Freq: Four times a day (QID) | ORAL | Status: DC | PRN
  Filled 2024-03-18: qty 1

## 2024-03-18 NOTE — ED Provider Notes (Signed)
 [place] EMERGENCY DEPARTMENT AT [facility] Provider Note   CSN: [id number] Arrival date & time: 03/18/24  1006     Patient presents with: Back Pain, UTI, Fever, and Pneumonia   Helen White is a 28 y.o. female.  {Add pertinent medical, surgical, social history, OB history to HPI:5864} 28 year old female with no past medical history presents to the ED with a chief complaint of fever that is been ongoing for the past close to 10 days.  Patient reports she was evaluated in the emergency department 4 days ago and diagnosed with a urinary tract infection and started on Vantin , in addition she was also diagnosed with pneumonia which she was given azithromycin  for.  She reports that Saturday she began to have severe pain to the right flank, exacerbated with any type of movement along with ambulation.  She reports there is no alleviating factors.  Pain is also exacerbated whenever she lies flat.  She has had a consistent fever for the past several days, her highest recorded was 102.5 which she last took tylenol  for last night.  She is febrile on arrival with an oral temperature of 101.3, recorded by me.  She does have some nasal congestion which she reports began after her azithromycin .  She is endorsing some nausea but was given Zofran  while in triage.  She denies any hematuria, no vaginal discharge, no prior surgical history to her abdomen.   The history is provided by the patient.  Back Pain Location:  Thoracic spine Quality:  Aching Radiates to:  Does not radiate Pain severity:  Moderate Pain is:  Same all the time Onset quality:  Gradual Duration:  4 days Timing:  Constant Progression:  Worsening Chronicity:  New Associated symptoms: fever   Associated symptoms: no chest pain   Fever Associated symptoms: chills   Associated symptoms: no chest pain   Pneumonia Pertinent negatives include no chest pain and no shortness of breath.       Prior to Admission  medications   Medication Sig Start Date End Date Taking? Authorizing Provider  azithromycin  (ZITHROMAX ) 250 MG tablet Take 1 tablet (250 mg total) by mouth daily. Take first 2 tablets together, then 1 every day until finished. 03/14/24  Yes Simon Lavonia SAILOR, MD  benzonatate  (TESSALON ) 200 MG capsule Take 1 capsule (200 mg total) by mouth 2 (two) times daily as needed for cough. 03/09/24  Yes Hawks, Christy A, FNP  cefpodoxime  (VANTIN ) 200 MG tablet Take 1 tablet (200 mg total) by mouth 2 (two) times daily for 12 days. 03/14/24 03/26/24 Yes Simon Lavonia SAILOR, MD  Ibuprofen  (ADVIL  LIQUI-GELS MINIS) 200 MG CAPS Take 400 mg by mouth every 6 (six) hours as needed (fever, pain, headache).   Yes [provider]  levonorgestrel (MIRENA, 52 MG,) 20 MCG/DAY IUD PROVIDED BY [facility] 04/26/22  Yes [provider]  predniSONE  (STERAPRED UNI-PAK 21 TAB) 10 MG (21) TBPK tablet Use as directed Patient not taking: Reported on 03/18/2024 03/09/24   Lavell Bari LABOR, FNP    Allergies: Penicillins    Review of Systems  Constitutional:  Positive for chills and fever.  Respiratory:  Negative for shortness of breath.   Cardiovascular:  Negative for chest pain.  Genitourinary:  Positive for flank pain.  Musculoskeletal:  Positive for back pain.  All other systems reviewed and are negative.   Updated Vital Signs BP 94/64   Pulse (!) 108   Temp 98 F (36.7 C)   Resp 17  SpO2 98%   Physical Exam Vitals and nursing note reviewed.  Constitutional:      Appearance: Normal appearance.  HENT:     Head: Normocephalic and atraumatic.     Mouth/Throat:     Mouth: Mucous membranes are moist.  Cardiovascular:     Rate and Rhythm: Normal rate.  Pulmonary:     Effort: Pulmonary effort is normal.  Abdominal:     General: Abdomen is flat.     Palpations: Abdomen is soft.     Tenderness: There is right CVA tenderness.  Musculoskeletal:     Cervical back: Normal range of motion and neck supple.   Skin:    General: Skin is warm and dry.  Neurological:     Mental Status: She is alert and oriented to person, place, and time.     (all labs ordered are listed, but only abnormal results are displayed) Labs Reviewed  COMPREHENSIVE METABOLIC PANEL WITH GFR - Abnormal; Notable for the following components:      Result Value   BUN 5 (*)    Albumin 3.1 (*)    AST 43 (*)    ALT 57 (*)    All other components within normal limits  URINALYSIS, ROUTINE W REFLEX MICROSCOPIC - Abnormal; Notable for the following components:   Color, Urine AMBER (*)    Hgb urine dipstick SMALL (*)    Bilirubin Urine SMALL (*)    Ketones, ur >80 (*)    Protein, ur 30 (*)    Leukocytes,Ua SMALL (*)    All other components within normal limits  URINALYSIS, MICROSCOPIC (REFLEX) - Abnormal; Notable for the following components:   Bacteria, UA FEW (*)    All other components within normal limits  RESP PANEL BY RT-PCR (RSV, FLU A&B, COVID)  RVPGX2  URINE CULTURE  CULTURE, BLOOD (ROUTINE X 2)  CULTURE, BLOOD (ROUTINE X 2)  CULTURE, BLOOD (SINGLE)  LIPASE, BLOOD  CBC  HCG, SERUM, QUALITATIVE    EKG: None  Radiology: DG Chest 2 View Result Date: 03/18/2024 CLINICAL DATA:  Shortness of breath. EXAM: CHEST - 2 VIEW COMPARISON:  03/14/2024 FINDINGS: The heart size and mediastinal contours are within normal limits. Both lungs are clear. Subtle interstitial opacity seen previously not evident on the current exam. The visualized skeletal structures are unremarkable. IMPRESSION: No active cardiopulmonary disease. Electronically Signed   By: Camellia Candle M.D.   On: 03/18/2024 11:35    {Document cardiac monitor, telemetry assessment procedure when appropriate:32947} .Critical Care  Performed by: Jakayla Schweppe, PA-C Authorized by: Caddie Randle, PA-C   Critical care provider statement:    Critical care time (minutes):  45   Critical care start time:  03/18/2024 2:30 PM   Critical care end time:  03/18/2024 3:15  PM   Critical care was necessary to treat or prevent imminent or life-threatening deterioration of the following conditions:  Sepsis   Critical care was time spent personally by me on the following activities:  Examination of patient and evaluation of patient's response to treatment    Medications Ordered in the ED  lactated ringers  infusion ( Intravenous New Bag/Given 03/18/24 1300)  ondansetron  (ZOFRAN -ODT) disintegrating tablet 4 mg (4 mg Oral Given 03/18/24 1051)  sodium chloride  0.9 % bolus 1,000 mL (0 mLs Intravenous Stopped 03/18/24 1302)  lactated ringers  bolus 1,000 mL (0 mLs Intravenous Stopped 03/18/24 1417)    And  lactated ringers  bolus 1,000 mL (1,000 mLs Intravenous New Bag/Given 03/18/24 1417)  cefTRIAXone  (ROCEPHIN ) 2 g  in sodium chloride  0.9 % 100 mL IVPB (0 g Intravenous Stopped 03/18/24 1302)  acetaminophen  (TYLENOL ) tablet 1,000 mg (1,000 mg Oral Given 03/18/24 1208)  iohexol  (OMNIPAQUE ) 350 MG/ML injection 75 mL (75 mLs Intravenous Contrast Given 03/18/24 1414)    Clinical Course as of 03/18/24 1522  Tue Mar 18, 2024  1145 Pulse Rate(!): 116 [JS]  1145 Temp: 98 F (36.7 C) [JS]  [address]): SMALL [JS]  1222 Hgb urine dipstick(!): SMALL [JS]  1222 Bacteria, UA(!): FEW [JS]    Clinical Course User Index [JS] Letesha Klecker, PA-C   {Click here for ABCD2, HEART and other calculators REFRESH Note before signing:1}                              Medical Decision Making Amount and/or Complexity of Data Reviewed Labs: ordered. Decision-making details documented in ED Course. Radiology: ordered.  Risk OTC drugs. Prescription drug management.   This patient presents to the ED for concern of back pain, fever, this involves a number of treatment options, and is a complaint that carries with it a highi risk of complications and morbidity.  The differential diagnosis includes pyelonephritis, infected stone,  sepsis.    Co morbidities: Discussed in HPI   Brief  History:  See HPI.   EMR reviewed including pt PMHx, past surgical history and past visits to ER.   See HPI for more details   Lab Tests:  I ordered and independently interpreted labs.  The pertinent results include:    I personally reviewed all laboratory work and imaging. Metabolic panel without any acute abnormality specifically kidney function within normal limits and no significant electrolyte abnormalities. CBC without leukocytosis or significant anemia.  Lipase level is normal.  UA with few bacteria, no nitrites present however she has been on antibiotics for approximately 4 days.  Imaging Studies:  CT chest abdomen and pelvis obtained to further evaluate pneumonia versus infected stone pending    Medicines ordered:  I ordered medication including rocephin   for UTI  Reevaluation of the patient after these medicines showed that the patient stayed the same I have reviewed the patients home medicines and have made adjustments as needed   Critical Interventions:  ***   Consults:  I requested consultation with ***,  and discussed lab and imaging findings as well as pertinent plan - they recommend: ***    Reevaluation:  After the interventions noted above I re-evaluated patient and found that they have :{resolved/improved/worsened:23923::improved}   Social Determinants of Health:  The patient's social determinants of health were a factor in the care of this patient  Problem List / ED Course:  ***   Dispostion:  After consideration of the diagnostic results and the patients response to treatment, I feel that the patent would benefit from ***    Portions of this note were generated with Dragon dictation software. Dictation errors may occur despite best attempts at proofreading.   Final diagnoses:  None    ED Discharge Orders     None

## 2024-03-18 NOTE — Progress Notes (Signed)

## 2024-03-18 NOTE — Progress Notes (Signed)
 Elink is following code sepsis.

## 2024-03-18 NOTE — Progress Notes (Signed)
 Requested Lactic acid per sepsis protocol.

## 2024-03-18 NOTE — H&P (Signed)
 History and Physical    Helen White FMW:[id number] DOB: Jun 17, 1995 DOA: 03/18/2024  I have briefly reviewed the patient's prior medical records in [facility]  PCP: Patient, No Pcp Per  Patient coming from: home  Chief Complaint: Generalized weakness, fevers, right flank pain  HPI: Helen White is a 28 y.o. female with without past medical history, not taking any prescription medications on a regular basis comes into the hospital with generalized weakness, fevers, right flank pain.  Patient has been having high fevers for the past 9 days, coming and going.  She would have couple days with fevers and feeling very weak, and then a day or 2 of feeling fairly normal and then symptoms returning.  She presented to the emergency room here on 12/5 with similar symptoms, she was diagnosed with atypical pneumonia based on a chest x-ray, also UTI given positive urinalysis, she was prescribed azithromycin  and cefpodoxime  and sent home.  She do the antibiotics for couple of days, however her symptoms persisted and newly developed right flank pain, has been having persistent fevers and decided come back to the emergency room.  She denies any vomiting but did report some intermittent nausea.  She complains of a cough but mainly when sitting down, no sputum production.  She denies any dysuria.  ED Course: In the emergency room she is febrile to 101.3, tachycardic up to 116, and blood pressures have been ranging between 90s and 100s systolic.  Blood work fairly unremarkable with normal CBC, no leukocytosis.  Pregnancy is negative.  COVID, RSV, flu all negative.  Urinalysis with few bacteria, increased ketones small leukocytes but no nitrites.  She was given ceftriaxone  and we are asked to admit.  CT scan of the abdomen and pelvis is pending  Review of Systems: All systems reviewed, and apart from HPI, all negative  Past Medical History:  Diagnosis Date   Anemia    GERD (gastroesophageal  reflux disease)    Migraine     Past Surgical History:  Procedure Laterality Date   NO PAST SURGERIES       reports that she has never smoked. She has never used smokeless tobacco. She reports that she does not drink alcohol and does not use drugs.  Allergies  Allergen Reactions   Penicillins Hives    Tolerates cephalosporins     Family History  Problem Relation Age of Onset   Healthy Mother    Migraines Mother    Healthy Father    Migraines Sister     Prior to Admission medications   Medication Sig Start Date End Date Taking? Authorizing Provider  azithromycin  (ZITHROMAX ) 250 MG tablet Take 1 tablet (250 mg total) by mouth daily. Take first 2 tablets together, then 1 every day until finished. 03/14/24  Yes Simon Lavonia SAILOR, MD  benzonatate  (TESSALON ) 200 MG capsule Take 1 capsule (200 mg total) by mouth 2 (two) times daily as needed for cough. 03/09/24  Yes Hawks, Christy A, FNP  cefpodoxime  (VANTIN ) 200 MG tablet Take 1 tablet (200 mg total) by mouth 2 (two) times daily for 12 days. 03/14/24 03/26/24 Yes Simon Lavonia SAILOR, MD  Ibuprofen  (ADVIL  LIQUI-GELS MINIS) 200 MG CAPS Take 400 mg by mouth every 6 (six) hours as needed (fever, pain, headache).   Yes [provider]  levonorgestrel (MIRENA, 52 MG,) 20 MCG/DAY IUD PROVIDED BY [facility]ARE [facility] 04/26/22  Yes [provider]  predniSONE  (STERAPRED UNI-PAK 21 TAB) 10 MG (21) TBPK tablet Use as directed  Patient not taking: Reported on 03/18/2024 03/09/24   Lavell Bari LABOR, FNP    Physical Exam: Vitals:   03/18/24 1200 03/18/24 1330 03/18/24 1400 03/18/24 1500  BP: 100/65 (!) 96/51 (!) 99/56 94/64  Pulse: (!) 112 (!) 106 97 (!) 108  Resp: 18 17  17   Temp:  98 F (36.7 C)    TempSrc:      SpO2: 99% 99% 97% 98%    Constitutional: NAD, calm, comfortable Eyes: PERRL, lids and conjunctivae normal ENMT: Mucous membranes are moist. Posterior pharynx clear of any exudate or lesions. Neck: normal, supple Respiratory:  clear to auscultation bilaterally, no wheezing, no crackles. Normal respiratory effort.  Cardiovascular: Regular rate and rhythm, no murmurs / rubs / gallops. No extremity edema.  Abdomen: Right lower quadrant tenderness on deep palpation, no masses palpated.  No guarding or rebound bowel sounds positive.  Musculoskeletal: no clubbing / cyanosis. Normal muscle tone.  CVA tenderness present on the right side Skin: no rashes, lesions, ulcers. No induration Neurologic: Nonfocal Psychiatric: Normal judgment and insight. Alert and oriented x 3. Normal mood.   Labs on Admission: I have personally reviewed following labs and imaging studies  CBC: Recent Labs  Lab 03/14/24 1202 03/18/24 1054  WBC 6.2 5.6  NEUTROABS 4.6  --   HGB 14.7 14.3  HCT 44.5 42.7  MCV 86.1 85.1  PLT 227 230   Basic Metabolic Panel: Recent Labs  Lab 03/14/24 1202 03/18/24 1054  NA 134* 139  K 3.5 4.5  CL 103 104  CO2 19* 26  GLUCOSE 108* 96  BUN 7 5*  CREATININE 0.84 0.80  CALCIUM 8.7* 9.0   Liver Function Tests: Recent Labs  Lab 03/14/24 1202 03/18/24 1054  AST 60* 43*  ALT 95* 57*  ALKPHOS 93 73  BILITOT 1.0 1.0  PROT 7.7 7.3  ALBUMIN 3.5 3.1*   Coagulation Profile: Recent Labs  Lab 03/14/24 1202  INR 1.1   BNP (last 3 results) No results for input(s): PROBNP in the last 8760 hours. CBG: No results for input(s): GLUCAP in the last 168 hours. Thyroid Function Tests: No results for input(s): TSH, T4TOTAL, FREET4, T3FREE, THYROIDAB in the last 72 hours. Urine analysis:    Component Value Date/Time   COLORURINE AMBER (A) 03/18/2024 1122   APPEARANCEUR CLEAR 03/18/2024 1122   LABSPEC 1.025 03/18/2024 1122   PHURINE 6.0 03/18/2024 1122   GLUCOSEU NEGATIVE 03/18/2024 1122   HGBUR SMALL (A) 03/18/2024 1122   BILIRUBINUR SMALL (A) 03/18/2024 1122   BILIRUBINUR moderate (A) 03/14/2024 1020   KETONESUR >80 (A) 03/18/2024 1122   PROTEINUR 30 (A) 03/18/2024 1122   UROBILINOGEN  >=8.0 (A) 03/14/2024 1020   NITRITE NEGATIVE 03/18/2024 1122   LEUKOCYTESUR SMALL (A) 03/18/2024 1122     Radiological Exams on Admission: DG Chest 2 View Result Date: 03/18/2024 CLINICAL DATA:  Shortness of breath. EXAM: CHEST - 2 VIEW COMPARISON:  03/14/2024 FINDINGS: The heart size and mediastinal contours are within normal limits. Both lungs are clear. Subtle interstitial opacity seen previously not evident on the current exam. The visualized skeletal structures are unremarkable. IMPRESSION: No active cardiopulmonary disease. Electronically Signed   By: Camellia Candle M.D.   On: 03/18/2024 11:35    Assessment/Plan Principal problem SIRS, possible pyelonephritis -patient had a positive urinalysis with many bacteria, significant pyuria when she presented to the ER on 12/5.  She has received oral antibiotics since, and now urinalysis is not as impressive, however she does have significant CVA tenderness  on the right suggesting pyelonephritis - CT scan of the abdomen pelvis is pending, would be important to rule out an abscess, although CT scan done 4 days ago was without acute findings - Has been placed on ceftriaxone , continue - Unfortunately during her ED visit on 12/5, a urine culture was not sent.  Cultures now very likely to remain negative given that she was exposed to antibiotics meanwhile  Active problems ?  Pneumonia -minimal symptoms, no productive cough.  Chest x-ray this time fairly unremarkable.  I doubt this.  CT scan is pending though  DVT prophylaxis: Lovenox  Code Status: Full code Family Communication: Husband at bedside Bed Type: MedSurg Consults called: None Obs/Inp: Obs  Nilda Fendt, MD, PhD [facility]  Contact via [email or web address]  03/18/2024, 3:36 PM

## 2024-03-18 NOTE — ED Triage Notes (Addendum)
 PT was here on Friday with fever.  Dx with PNA and UTI. Sent home with a scrip that she picked up on Sat and took Sat/Sunday.  She has been experiencing 9/10 severe right lower back pain that radiates to front since Sat. Did E-visit with UC who told her to come back here. PT also endorses on going fever over the weekend tx with ibuprofen .  Also endorses nausea r/t pain.

## 2024-03-19 ENCOUNTER — Inpatient Hospital Stay (HOSPITAL_COMMUNITY)

## 2024-03-19 DIAGNOSIS — N12 Tubulo-interstitial nephritis, not specified as acute or chronic: Secondary | ICD-10-CM | POA: Diagnosis not present

## 2024-03-19 DIAGNOSIS — R630 Anorexia: Secondary | ICD-10-CM | POA: Diagnosis not present

## 2024-03-19 DIAGNOSIS — I959 Hypotension, unspecified: Secondary | ICD-10-CM

## 2024-03-19 DIAGNOSIS — N10 Acute tubulo-interstitial nephritis: Secondary | ICD-10-CM | POA: Diagnosis present

## 2024-03-19 DIAGNOSIS — Z820 Family history of epilepsy and other diseases of the nervous system: Secondary | ICD-10-CM | POA: Diagnosis not present

## 2024-03-19 DIAGNOSIS — R110 Nausea: Secondary | ICD-10-CM | POA: Diagnosis not present

## 2024-03-19 DIAGNOSIS — T368X5A Adverse effect of other systemic antibiotics, initial encounter: Secondary | ICD-10-CM | POA: Diagnosis not present

## 2024-03-19 DIAGNOSIS — E876 Hypokalemia: Secondary | ICD-10-CM | POA: Diagnosis present

## 2024-03-19 DIAGNOSIS — Z6835 Body mass index (BMI) 35.0-35.9, adult: Secondary | ICD-10-CM | POA: Diagnosis not present

## 2024-03-19 DIAGNOSIS — R509 Fever, unspecified: Secondary | ICD-10-CM | POA: Diagnosis present

## 2024-03-19 DIAGNOSIS — T360X5A Adverse effect of penicillins, initial encounter: Secondary | ICD-10-CM | POA: Diagnosis not present

## 2024-03-19 DIAGNOSIS — A419 Sepsis, unspecified organism: Secondary | ICD-10-CM | POA: Diagnosis not present

## 2024-03-19 DIAGNOSIS — L500 Allergic urticaria: Secondary | ICD-10-CM | POA: Diagnosis not present

## 2024-03-19 DIAGNOSIS — Y92239 Unspecified place in hospital as the place of occurrence of the external cause: Secondary | ICD-10-CM | POA: Diagnosis not present

## 2024-03-19 DIAGNOSIS — E860 Dehydration: Secondary | ICD-10-CM | POA: Diagnosis present

## 2024-03-19 DIAGNOSIS — K219 Gastro-esophageal reflux disease without esophagitis: Secondary | ICD-10-CM | POA: Diagnosis present

## 2024-03-19 DIAGNOSIS — G43909 Migraine, unspecified, not intractable, without status migrainosus: Secondary | ICD-10-CM | POA: Diagnosis present

## 2024-03-19 DIAGNOSIS — Z975 Presence of (intrauterine) contraceptive device: Secondary | ICD-10-CM | POA: Diagnosis not present

## 2024-03-19 DIAGNOSIS — E66812 Obesity, class 2: Secondary | ICD-10-CM | POA: Diagnosis present

## 2024-03-19 DIAGNOSIS — Z8701 Personal history of pneumonia (recurrent): Secondary | ICD-10-CM | POA: Diagnosis not present

## 2024-03-19 DIAGNOSIS — B349 Viral infection, unspecified: Secondary | ICD-10-CM | POA: Diagnosis present

## 2024-03-19 DIAGNOSIS — Z880 Allergy status to penicillin: Secondary | ICD-10-CM | POA: Diagnosis not present

## 2024-03-19 LAB — COMPREHENSIVE METABOLIC PANEL WITH GFR
ALT: 45 U/L — ABNORMAL HIGH (ref 0–44)
AST: 40 U/L (ref 15–41)
Albumin: 2.2 g/dL — ABNORMAL LOW (ref 3.5–5.0)
Alkaline Phosphatase: 55 U/L (ref 38–126)
Anion gap: 3 — ABNORMAL LOW (ref 5–15)
BUN: <5 mg/dL — ABNORMAL LOW (ref 6–20)
CO2: 26 mmol/L (ref 22–32)
Calcium: 7.6 mg/dL — ABNORMAL LOW (ref 8.9–10.3)
Chloride: 108 mmol/L (ref 98–111)
Creatinine, Ser: 0.61 mg/dL (ref 0.44–1.00)
GFR, Estimated: >60 mL/min (ref >60)
Glucose, Bld: 93 mg/dL (ref 70–99)
Potassium: 3.3 mmol/L — ABNORMAL LOW (ref 3.5–5.1)
Sodium: 137 mmol/L (ref 135–145)
Total Bilirubin: 0.7 mg/dL (ref 0.0–1.2)
Total Protein: 5.4 g/dL — ABNORMAL LOW (ref 6.5–8.1)

## 2024-03-19 LAB — URINE CULTURE

## 2024-03-19 LAB — HIV ANTIBODY (ROUTINE TESTING W REFLEX): HIV Screen 4th Generation wRfx: NONREACTIVE

## 2024-03-19 LAB — CBC
HCT: 34.3 % — ABNORMAL LOW (ref 36.0–46.0)
Hemoglobin: 11.7 g/dL — ABNORMAL LOW (ref 12.0–15.0)
MCH: 29 pg (ref 26.0–34.0)
MCHC: 34.1 g/dL (ref 30.0–36.0)
MCV: 84.9 fL (ref 80.0–100.0)
Platelets: 197 K/uL (ref 150–400)
RBC: 4.04 MIL/uL (ref 3.87–5.11)
RDW: 13.2 % (ref 11.5–15.5)
WBC: 4 K/uL (ref 4.0–10.5)
nRBC: 0 % (ref 0.0–0.2)

## 2024-03-19 LAB — PROCALCITONIN: Procalcitonin: 0.11 ng/mL

## 2024-03-19 LAB — LACTIC ACID, PLASMA: Lactic Acid, Venous: 1.6 mmol/L (ref 0.5–1.9)

## 2024-03-19 MED ORDER — SODIUM CHLORIDE 0.9 % IV SOLN: INTRAVENOUS | Status: AC

## 2024-03-19 MED ORDER — LACTATED RINGERS IV BOLUS
1000.0000 mL | Freq: Once | INTRAVENOUS | Status: AC
  Administered 2024-03-19: 1000 mL via INTRAVENOUS

## 2024-03-19 NOTE — TOC CM/SW Note (Signed)
 Transition of Care [facility]) - Inpatient Brief Assessment   Patient Details  Name: Helen White MRN: [id number] Date of Birth: 07/25/1995  [facility] CM/SW Contact:    [name], RN Phone Number: 03/19/2024, 9:57 AM   Clinical Narrative:  Spoke to patient regarding transition needs.  Patient prefers to make her own PCP apt where her children are currently being seen.  ICM (Inpatient Care Management) will continue to follow, no needs anticipated at this time.   Transition of Care Asessment: Insurance and Status: Insurance coverage has been reviewed Patient has primary care physician: No (patient will make her own PCP) Home environment has been reviewed: safe to discharge home Prior level of function:: independent Prior/Current Home Services: No current home services Social Drivers of Health Review: SDOH reviewed no interventions necessary Readmission risk has been reviewed: Yes Transition of care needs: no transition of care needs at this time

## 2024-03-19 NOTE — Progress Notes (Signed)
 PROGRESS NOTE    Helen White  FMW:[id number] DOB: Oct 31, 1995 DOA: 03/18/2024 PCP: Patient, No Pcp Per  Subjective:  No acute events overnight. Seen and examined at bedside with grandparents present. Reports feeling nauseous, weak and dizzy still. Reports oral intake remains poor. Denies any vomiting or constipation.   Hospital Course:  28 y.o. female with without past medical history, not taking any prescription medications on a regular basis comes into the hospital with generalized weakness, fevers, right flank pain.  Patient has been having high fevers for the past 9 days, coming and going.  She would have couple days with fevers and feeling very weak, and then a day or 2 of feeling fairly normal and then symptoms returning.  She presented to the emergency room here on 12/5 with similar symptoms, she was diagnosed with atypical pneumonia based on a chest x-ray, also UTI given positive urinalysis, she was prescribed azithromycin  and cefpodoxime  and sent home.  She took the antibiotics for couple of days, however her symptoms persisted and newly developed right flank pain with persistent fevers so decided come back to the emergency room.  She denies any vomiting but did report some intermittent nausea.  She complains of a cough but mainly when sitting down, no sputum production.  She denies any dysuria.     Assessment and Plan:  Sepsis of unclear etiology - continues to have intermittent fevers - CXR unremarkable - UA unremarkable, however, has been on antibiotics recently - CT A/P unremarkable - Bcx x2 with NGTD, final read pending - status post sepsis fluids in ED/admission - cont ceftriaxone  for now  Hypotension - etiology unclear, likely in the setting of sepsis and poor oral intake - status post sepsis fluids as elsewhere - will give another 1L LR now - keep on continuous fluids - encourage oral intake/hydration - will check lactate  DVT prophylaxis: enoxaparin   (LOVENOX ) injection 40 mg Start: 03/18/24 2000  SCDs   Code Status: Full Code Family Communication: updated grandparents at bedside Disposition Plan: home Reason for continuing need for hospitalization: IV antibiotics, monitor hypotension, encourage oral intake  Objective: Vitals:   03/19/24 0450 03/19/24 0825 03/19/24 0900 03/19/24 1100  BP: 97/61 (!) 93/55 (!) 95/58 (!) 88/56  Pulse: (!) 107 (!) 129 (!) 110 (!) 112  Resp: 16 17 16 18   Temp: 98.8 F (37.1 C) (!) 102.9 F (39.4 C) 98.2 F (36.8 C) 98.7 F (37.1 C)  TempSrc: Oral Oral Oral Oral  SpO2: 97% 97% 97% 96%  Weight:      Height:        Intake/Output Summary (Last 24 hours) at 03/19/2024 1237 Last data filed at 03/18/2024 1928 Gross per 24 hour  Intake 3828.97 ml  Output --  Net 3828.97 ml   Filed Weights   03/18/24 1822  Weight: 93.4 kg    Examination:  Physical Exam Vitals and nursing note reviewed.  Constitutional:      General: She is not in acute distress.    Appearance: She is ill-appearing.     Comments: Weak, frail  HENT:     Head: Normocephalic and atraumatic.  Cardiovascular:     Rate and Rhythm: Normal rate and regular rhythm.     Pulses: Normal pulses.     Heart sounds: Normal heart sounds.  Pulmonary:     Effort: Pulmonary effort is normal.     Breath sounds: Normal breath sounds.  Abdominal:     General: Bowel sounds are normal.  Palpations: Abdomen is soft.  Neurological:     Mental Status: She is alert.     Data Reviewed: I have personally reviewed following labs and imaging studies  CBC: Recent Labs  Lab 03/14/24 1202 03/18/24 1054 03/19/24 0449  WBC 6.2 5.6 4.0  NEUTROABS 4.6  --   --   HGB 14.7 14.3 11.7*  HCT 44.5 42.7 34.3*  MCV 86.1 85.1 84.9  PLT 227 230 197   Basic Metabolic Panel: Recent Labs  Lab 03/14/24 1202 03/18/24 1054 03/19/24 0449  NA 134* 139 137  K 3.5 4.5 3.3*  CL 103 104 108  CO2 19* 26 26  GLUCOSE 108* 96 93  BUN 7 5* <5*   CREATININE 0.84 0.80 0.61  CALCIUM 8.7* 9.0 7.6*   GFR: Estimated Creatinine Clearance: 115 mL/min (by C-G formula based on SCr of 0.61 mg/dL). Liver Function Tests: Recent Labs  Lab 03/14/24 1202 03/18/24 1054 03/19/24 0449  AST 60* 43* 40  ALT 95* 57* 45*  ALKPHOS 93 73 55  BILITOT 1.0 1.0 0.7  PROT 7.7 7.3 5.4*  ALBUMIN 3.5 3.1* 2.2*   Recent Labs  Lab 03/14/24 1202 03/18/24 1054  LIPASE 28 25   No results for input(s): AMMONIA in the last 168 hours. Coagulation Profile: Recent Labs  Lab 03/14/24 1202  INR 1.1   Cardiac Enzymes: No results for input(s): CKTOTAL, CKMB, CKMBINDEX, TROPONINI in the last 168 hours. ProBNP, BNP (last 5 results) No results for input(s): PROBNP, BNP in the last 8760 hours. HbA1C: No results for input(s): HGBA1C in the last 72 hours. CBG: No results for input(s): GLUCAP in the last 168 hours. Lipid Profile: No results for input(s): CHOL, HDL, LDLCALC, TRIG, CHOLHDL, LDLDIRECT in the last 72 hours. Thyroid Function Tests: No results for input(s): TSH, T4TOTAL, FREET4, T3FREE, THYROIDAB in the last 72 hours. Anemia Panel: No results for input(s): VITAMINB12, FOLATE, FERRITIN, TIBC, IRON, RETICCTPCT in the last 72 hours. Sepsis Labs: No results for input(s): PROCALCITON, LATICACIDVEN in the last 168 hours.  Recent Results (from the past 240 hours)  Resp panel by RT-PCR (RSV, Flu A&B, Covid) Anterior Nasal Swab     Status: None   Collection Time: 03/18/24 10:50 AM   Specimen: Anterior Nasal Swab  Result Value Ref Range Status   SARS Coronavirus 2 by RT PCR NEGATIVE NEGATIVE Final   Influenza A by PCR NEGATIVE NEGATIVE Final   Influenza B by PCR NEGATIVE NEGATIVE Final    Comment: (NOTE) The Xpert Xpress SARS-CoV-2/FLU/RSV plus assay is intended as an aid in the diagnosis of influenza from Nasopharyngeal swab specimens and should not be used as a sole basis for treatment. Nasal  washings and aspirates are unacceptable for Xpert Xpress SARS-CoV-2/FLU/RSV testing.  Fact Sheet for Patients: bloggercourse.com  Fact Sheet for Healthcare Providers: seriousbroker.it  This test is not yet approved or cleared by the [place]  FDA and has been authorized for detection and/or diagnosis of SARS-CoV-2 by FDA under an Emergency Use Authorization (EUA). This EUA will remain in effect (meaning this test can be used) for the duration of the COVID-19 declaration under Section 564(b)(1) of the Act, 21 U.S.C. section 360bbb-3(b)(1), unless the authorization is terminated or revoked.     Resp Syncytial Virus by PCR NEGATIVE NEGATIVE Final    Comment: (NOTE) Fact Sheet for Patients: bloggercourse.com  Fact Sheet for Healthcare Providers: seriousbroker.it  This test is not yet approved or cleared by the [place]  FDA and has been authorized for detection  and/or diagnosis of SARS-CoV-2 by FDA under an Emergency Use Authorization (EUA). This EUA will remain in effect (meaning this test can be used) for the duration of the COVID-19 declaration under Section 564(b)(1) of the Act, 21 U.S.C. section 360bbb-3(b)(1), unless the authorization is terminated or revoked.  Performed at [facility], [address] [place], [place] [address]   Blood culture (routine x 2)     Status: None (Preliminary result)   Collection Time: 03/18/24 11:58 AM   Specimen: BLOOD RIGHT FOREARM  Result Value Ref Range Status   Specimen Description BLOOD RIGHT FOREARM  Final   Special Requests   Final    BOTTLES DRAWN AEROBIC AND ANAEROBIC Blood Culture results may not be optimal due to an inadequate volume of blood received in culture bottles   Culture   Final    NO GROWTH < 24 HOURS Performed at [facility], [address] [place], [place] [address]    Report Status PENDING   Incomplete  Urine Culture     Status: Abnormal   Collection Time: 03/18/24 12:23 PM   Specimen: Urine, Clean Catch  Result Value Ref Range Status   Specimen Description URINE, CLEAN CATCH  Final   Special Requests   Final    NONE Performed at [facility], [address], [place], [place] [address]    Culture MULTIPLE SPECIES PRESENT, SUGGEST RECOLLECTION (A)  Final   Report Status 03/19/2024 FINAL  Final  Culture, blood (single)     Status: None (Preliminary result)   Collection Time: 03/18/24  5:30 PM   Specimen: BLOOD LEFT ARM  Result Value Ref Range Status   Specimen Description BLOOD LEFT ARM  Final   Special Requests   Final    BOTTLES DRAWN AEROBIC AND ANAEROBIC Blood Culture adequate volume   Culture   Final    NO GROWTH < 24 HOURS Performed at [facility], [address], [place], [place] [address]    Report Status PENDING  Incomplete     Radiology Studies: CT CHEST ABDOMEN PELVIS W CONTRAST Result Date: 03/18/2024 CLINICAL DATA:  Sepsis. Right lower back pain with fever for the last several days. EXAM: CT CHEST, ABDOMEN, AND PELVIS WITH CONTRAST TECHNIQUE: Multidetector CT imaging of the chest, abdomen and pelvis was performed following the standard protocol during bolus administration of intravenous contrast. RADIATION DOSE REDUCTION: This exam was performed according to the departmental dose-optimization program which includes automated exposure control, adjustment of the mA and/or kV according to patient size and/or use of iterative reconstruction technique. CONTRAST:  75mL OMNIPAQUE  IOHEXOL  350 MG/ML SOLN COMPARISON:  Chest radiographs 03/18/2024. Abdominopelvic CT 03/14/2024. FINDINGS: CT CHEST FINDINGS Cardiovascular: No significant vascular findings. The heart size is normal. There is no pericardial effusion. Mediastinum/Nodes: There are no enlarged mediastinal, hilar or axillary lymph nodes. Small amount of residual thymic tissue in the anterior mediastinum,  not uncommon for age. The thyroid gland, trachea and esophagus demonstrate no significant findings. Lungs/Pleura: No pleural effusion or pneumothorax. Mildly increased subsegmental atelectasis at both lung bases. No confluent airspace disease or suspicious nodularity. Musculoskeletal/Chest wall: No chest wall mass or suspicious osseous findings. No evidence of discitis or osteomyelitis. CT ABDOMEN AND PELVIS FINDINGS Hepatobiliary: The liver is normal in density without suspicious focal abnormality. No evidence of gallstones, gallbladder wall thickening or biliary dilatation. Pancreas: Unremarkable. No pancreatic ductal dilatation or surrounding inflammatory changes. Spleen: Normal in size without focal abnormality. Adrenals/Urinary Tract: Both adrenal glands  appear normal. No evidence of urinary tract calculus, suspicious renal lesion or hydronephrosis. The bladder appears normal for its degree of distention. Stomach/Bowel: No enteric contrast administered. The stomach appears unremarkable for its degree of distension. No evidence of bowel wall thickening, distention or surrounding inflammatory change. The appendix appears normal. Vascular/Lymphatic: There are no enlarged abdominal or pelvic lymph nodes. No significant vascular findings. Reproductive: Intrauterine device in place. The uterus and ovaries otherwise appear unremarkable. No adnexal mass. Other: No evidence of abdominal wall mass or hernia. No ascites or pneumoperitoneum. Musculoskeletal: No acute or significant osseous findings. No evidence of discitis or osteomyelitis. The sacroiliac joints appear unremarkable. Stable mild irregularity of the symphysis pubis, likely developmental or secondary to previous childbirth. IMPRESSION: 1. No acute findings or explanation for the patient's symptoms. 2. Mildly increased subsegmental atelectasis at both lung bases. 3. Intrauterine device in place. Electronically Signed   By: Elsie Perone M.D.   On: 03/18/2024  15:43   DG Chest 2 View Result Date: 03/18/2024 CLINICAL DATA:  Shortness of breath. EXAM: CHEST - 2 VIEW COMPARISON:  03/14/2024 FINDINGS: The heart size and mediastinal contours are within normal limits. Both lungs are clear. Subtle interstitial opacity seen previously not evident on the current exam. The visualized skeletal structures are unremarkable. IMPRESSION: No active cardiopulmonary disease. Electronically Signed   By: Camellia Candle M.D.   On: 03/18/2024 11:35    Scheduled Meds:  enoxaparin  (LOVENOX ) injection  40 mg Subcutaneous Q24H   Continuous Infusions:  sodium chloride  100 mL/hr at 03/18/24 1942   cefTRIAXone  (ROCEPHIN )  IV 2 g (03/19/24 0559)   lactated ringers        LOS: 0 days   Norval Bar, MD  [facility]  03/19/2024, 12:37 PM

## 2024-03-19 NOTE — Progress Notes (Addendum)
 [facility] night cross cover note:   I was notified by the patient's RN that the patient's updated vital signs, with most recent temperature 102.9, consistent with her documented fever earlier this morning, associate with sinus tachycardia, with most recent heart rate of 130, which is similar to her heart rates in the morning, but appears slightly faster relative to afternoon readings.  She was previously on continuous IV fluids, that is subsequently expired.  Will resume IV fluids at this time.  Systolic blood pressures remain in the 90s with maps greater than 65, consistent with blood pressures throughout the day.  She has now received a dose of Tylenol , with repeat vital signs pending.  She is currently on Rocephin  for concern regarding potential right-sided pyelonephritis.  She continues to report right flank discomfort, now some radiation into her left back.  Blood cultures drawn yesterday, reported to show no growth to date.  Per brief chart review, there is documentation of atypical pneumonia 03/14/2024 .  Initial chest x-ray was reported showed no evidence of acute cardiopulmonary process.  In the setting of recurrent fevers, will repeat chest x-ray at this time, and check procalcitonin level.  I have also resumed continuous IV fluids with order for normal saline running at 125 cc/h, and added cbc with diff to tomorrow AM's labs.     Eva Pore, DO Hospitalist

## 2024-03-19 NOTE — Plan of Care (Signed)

## 2024-03-20 LAB — CBC WITH DIFFERENTIAL/PLATELET
Abs Immature Granulocytes: 0.02 K/uL (ref 0.00–0.07)
Basophils Absolute: 0 K/uL (ref 0.0–0.1)
Basophils Relative: 0 %
Eosinophils Absolute: 0 K/uL (ref 0.0–0.5)
Eosinophils Relative: 0 %
HCT: 35.5 % — ABNORMAL LOW (ref 36.0–46.0)
Hemoglobin: 11.9 g/dL — ABNORMAL LOW (ref 12.0–15.0)
Immature Granulocytes: 0 %
Lymphocytes Relative: 38 %
Lymphs Abs: 1.7 K/uL (ref 0.7–4.0)
MCH: 28 pg (ref 26.0–34.0)
MCHC: 33.5 g/dL (ref 30.0–36.0)
MCV: 83.5 fL (ref 80.0–100.0)
Monocytes Absolute: 0.3 K/uL (ref 0.1–1.0)
Monocytes Relative: 7 %
Neutro Abs: 2.4 K/uL (ref 1.7–7.7)
Neutrophils Relative %: 55 %
Platelets: 202 K/uL (ref 150–400)
RBC: 4.25 MIL/uL (ref 3.87–5.11)
RDW: 13.3 % (ref 11.5–15.5)
Smear Review: NORMAL
WBC: 4.5 K/uL (ref 4.0–10.5)
nRBC: 0 % (ref 0.0–0.2)

## 2024-03-20 LAB — MONONUCLEOSIS SCREEN: Mono Screen: NEGATIVE

## 2024-03-20 LAB — BASIC METABOLIC PANEL WITH GFR
Anion gap: 8 (ref 5–15)
BUN: <5 mg/dL — ABNORMAL LOW (ref 6–20)
CO2: 22 mmol/L (ref 22–32)
Calcium: 7.5 mg/dL — ABNORMAL LOW (ref 8.9–10.3)
Chloride: 106 mmol/L (ref 98–111)
Creatinine, Ser: 0.57 mg/dL (ref 0.44–1.00)
GFR, Estimated: >60 mL/min (ref >60)
Glucose, Bld: 106 mg/dL — ABNORMAL HIGH (ref 70–99)
Potassium: 3.6 mmol/L (ref 3.5–5.1)
Sodium: 136 mmol/L (ref 135–145)

## 2024-03-20 LAB — CBC
HCT: 34 % — ABNORMAL LOW (ref 36.0–46.0)
Hemoglobin: 11.4 g/dL — ABNORMAL LOW (ref 12.0–15.0)
MCH: 28.3 pg (ref 26.0–34.0)
MCHC: 33.5 g/dL (ref 30.0–36.0)
MCV: 84.4 fL (ref 80.0–100.0)
Platelets: 183 K/uL (ref 150–400)
RBC: 4.03 MIL/uL (ref 3.87–5.11)
RDW: 13.4 % (ref 11.5–15.5)
WBC: 4.3 K/uL (ref 4.0–10.5)
nRBC: 0 % (ref 0.0–0.2)

## 2024-03-20 LAB — C-REACTIVE PROTEIN: CRP: 5.9 mg/dL — ABNORMAL HIGH (ref <1.0)

## 2024-03-20 LAB — SEDIMENTATION RATE: Sed Rate: 5 mm/h (ref 0–22)

## 2024-03-20 LAB — LACTIC ACID, PLASMA: Lactic Acid, Venous: 0.8 mmol/L (ref 0.5–1.9)

## 2024-03-20 MED ORDER — METOCLOPRAMIDE HCL 5 MG/ML IJ SOLN
5.0000 mg | Freq: Once | INTRAMUSCULAR | Status: AC
  Administered 2024-03-20: 5 mg via INTRAVENOUS
  Filled 2024-03-20: qty 2

## 2024-03-20 MED ORDER — VANCOMYCIN HCL 2000 MG/400ML IV SOLN
2000.0000 mg | Freq: Once | INTRAVENOUS | Status: AC
  Administered 2024-03-21: 2000 mg via INTRAVENOUS
  Filled 2024-03-20: qty 400

## 2024-03-20 MED ORDER — KETOROLAC TROMETHAMINE 15 MG/ML IJ SOLN
15.0000 mg | Freq: Once | INTRAMUSCULAR | Status: AC
  Administered 2024-03-20: 15 mg via INTRAVENOUS
  Filled 2024-03-20: qty 1

## 2024-03-20 MED ORDER — PIPERACILLIN-TAZOBACTAM 3.375 G IVPB: 3.3750 g | Freq: Three times a day (TID) | INTRAVENOUS | Status: DC

## 2024-03-20 MED ORDER — DIPHENHYDRAMINE HCL 50 MG/ML IJ SOLN
25.0000 mg | Freq: Once | INTRAMUSCULAR | Status: AC
  Administered 2024-03-20: 25 mg via INTRAVENOUS
  Filled 2024-03-20: qty 1

## 2024-03-20 MED ORDER — SODIUM CHLORIDE 0.9 % IV SOLN
2.0000 g | Freq: Three times a day (TID) | INTRAVENOUS | Status: DC
  Administered 2024-03-20 – 2024-03-21 (×2): 2 g via INTRAVENOUS
  Filled 2024-03-20 (×2): qty 12.5

## 2024-03-20 MED ORDER — PIPERACILLIN-TAZOBACTAM 3.375 G IVPB
3.3750 g | Freq: Once | INTRAVENOUS | Status: DC
  Administered 2024-03-20: 3.375 g via INTRAVENOUS
  Filled 2024-03-20: qty 50

## 2024-03-20 MED ORDER — LACTATED RINGERS IV BOLUS
1000.0000 mL | Freq: Once | INTRAVENOUS | Status: AC
  Administered 2024-03-20: 1000 mL via INTRAVENOUS

## 2024-03-20 MED ORDER — METRONIDAZOLE 500 MG/100ML IV SOLN
500.0000 mg | Freq: Two times a day (BID) | INTRAVENOUS | Status: DC
  Administered 2024-03-20 – 2024-03-21 (×2): 500 mg via INTRAVENOUS
  Filled 2024-03-20 (×2): qty 100

## 2024-03-20 MED ORDER — VANCOMYCIN HCL IN DEXTROSE 1-5 GM/200ML-% IV SOLN
1000.0000 mg | Freq: Two times a day (BID) | INTRAVENOUS | Status: DC
  Administered 2024-03-21 – 2024-03-22 (×3): 1000 mg via INTRAVENOUS
  Filled 2024-03-20 (×3): qty 200

## 2024-03-20 NOTE — Progress Notes (Signed)
 PROGRESS NOTE    Helen White  FMW:[id number] DOB: [birth year] DOA: 03/18/2024 PCP: Patient, No Pcp Per  Subjective:  Noted to have recurrent fever overnight, tachycardia and persistently low BP with poor oral intake for which was started on IV fluids. Seen and examined at bedside with grandparents present. Reports still having shivering and intermittent low grade fevers. Reports also having a mild cough. Reports oral intake remains poor. Denies nausea or vomiting. Having BMs  Hospital Course:  28 y.o. female with without past medical history, not taking any prescription medications on a regular basis comes into the hospital with generalized weakness, fevers, right flank pain.  Patient has been having high fevers for the past 9 days, coming and going.  She would have couple days with fevers and feeling very weak, and then a day or 2 of feeling fairly normal and then symptoms returning.  She presented to the emergency room here on 12/5 with similar symptoms, she was diagnosed with atypical pneumonia based on a chest x-ray, also UTI given positive urinalysis, she was prescribed azithromycin  and cefpodoxime  and sent home.  She took the antibiotics for couple of days, however her symptoms persisted and newly developed right flank pain with persistent fevers so decided come back to the emergency room.  She denies any vomiting but did report some intermittent nausea.  She complains of a cough but mainly when sitting down, no sputum production.  She denies any dysuria.    Assessment and Plan:  Sepsis of unclear etiology - no recent travel, sick contacts, medication changes - continues to have intermittent fevers - covid/flu/RSV negative - HIV screen negative - CRP 5.9, ESR 5 - CXR unremarkable - UA unremarkable, however, has been on antibiotics recently - CT C/A/P on 12/9 unremarkable - Bcx x2 with NGTD, final read pending - status post sepsis fluids in ED/admission - cont IV fluids for  now - cont ceftriaxone  for now - will get repeat blood cultures given recurrent fevers - will check monospot test, ANA, RF - will get ID consult for recurrent fevers   Poor oral intake/Hypotension - etiology unclear, likely in the setting of sepsis and poor oral intake - status post sepsis fluids as elsewhere - keep on continuous fluids until PO intake improves - encourage oral intake/hydration - lactate not elevated  Sinus tachycardia - EKG 12/11 confirmed - continue IV fluids as elsewhere for sepsis picture - monitor clinically  DVT prophylaxis: enoxaparin  (LOVENOX ) injection 40 mg Start: 03/18/24 2000  SCDs   Code Status: Full Code Family Communication: updated grandparents at bedside Disposition Plan: TBD pending clinical course Reason for continuing need for hospitalization: severity of illness, ID consult pending  Objective: Vitals:   03/20/24 0500 03/20/24 0719 03/20/24 1003 03/20/24 1130  BP:  94/67 107/76 109/74  Pulse: (!) 116 90 (!) 115 (!) 118  Resp:  16 20 20   Temp: 100.3 F (37.9 C) 98.4 F (36.9 C) 99.2 F (37.3 C) (!) 103.1 F (39.5 C)  TempSrc:  Oral Oral Oral  SpO2:  96% 100% 99%  Weight:      Height:        Intake/Output Summary (Last 24 hours) at 03/20/2024 1253 Last data filed at 03/19/2024 1600 Gross per 24 hour  Intake 2124.23 ml  Output --  Net 2124.23 ml   Filed Weights   03/18/24 1822  Weight: 93.4 kg    Examination:  Physical Exam Vitals and nursing note reviewed.  Constitutional:      General:  She is not in acute distress.    Appearance: She is ill-appearing.  HENT:     Head: Normocephalic and atraumatic.  Cardiovascular:     Rate and Rhythm: Normal rate and regular rhythm.     Pulses: Normal pulses.     Heart sounds: Normal heart sounds.  Pulmonary:     Effort: Pulmonary effort is normal.     Breath sounds: Normal breath sounds.  Abdominal:     General: Bowel sounds are normal.     Palpations: Abdomen is soft.   Musculoskeletal:     Right lower leg: No edema.  Skin:    General: Skin is warm and dry.  Neurological:     Mental Status: She is alert. Mental status is at baseline.     Data Reviewed: I have personally reviewed following labs and imaging studies  CBC: Recent Labs  Lab 03/14/24 1202 03/18/24 1054 03/19/24 0449 03/20/24 0425  WBC 6.2 5.6 4.0 4.5  NEUTROABS 4.6  --   --  2.4  HGB 14.7 14.3 11.7* 11.9*  HCT 44.5 42.7 34.3* 35.5*  MCV 86.1 85.1 84.9 83.5  PLT 227 230 197 202   Basic Metabolic Panel: Recent Labs  Lab 03/14/24 1202 03/18/24 1054 03/19/24 0449 03/20/24 0954  NA 134* 139 137 136  K 3.5 4.5 3.3* 3.6  CL 103 104 108 106  CO2 19* 26 26 22   GLUCOSE 108* 96 93 106*  BUN 7 5* <5* <5*  CREATININE 0.84 0.80 0.61 0.57  CALCIUM 8.7* 9.0 7.6* 7.5*   GFR: Estimated Creatinine Clearance: 115 mL/min (by C-G formula based on SCr of 0.57 mg/dL). Liver Function Tests: Recent Labs  Lab 03/14/24 1202 03/18/24 1054 03/19/24 0449  AST 60* 43* 40  ALT 95* 57* 45*  ALKPHOS 93 73 55  BILITOT 1.0 1.0 0.7  PROT 7.7 7.3 5.4*  ALBUMIN 3.5 3.1* 2.2*   Recent Labs  Lab 03/14/24 1202 03/18/24 1054  LIPASE 28 25   No results for input(s): AMMONIA in the last 168 hours. Coagulation Profile: Recent Labs  Lab 03/14/24 1202  INR 1.1   Cardiac Enzymes: No results for input(s): CKTOTAL, CKMB, CKMBINDEX, TROPONINI in the last 168 hours. ProBNP, BNP (last 5 results) No results for input(s): PROBNP, BNP in the last 8760 hours. HbA1C: No results for input(s): HGBA1C in the last 72 hours. CBG: No results for input(s): GLUCAP in the last 168 hours. Lipid Profile: No results for input(s): CHOL, HDL, LDLCALC, TRIG, CHOLHDL, LDLDIRECT in the last 72 hours. Thyroid Function Tests: No results for input(s): TSH, T4TOTAL, FREET4, T3FREE, THYROIDAB in the last 72 hours. Anemia Panel: No results for input(s): VITAMINB12, FOLATE,  FERRITIN, TIBC, IRON, RETICCTPCT in the last 72 hours. Sepsis Labs: Recent Labs  Lab 03/19/24 1405 03/19/24 2128  PROCALCITON  --  0.11  LATICACIDVEN 1.6  --     Recent Results (from the past 240 hours)  Resp panel by RT-PCR (RSV, Flu A&B, Covid) Anterior Nasal Swab     Status: None   Collection Time: 03/18/24 10:50 AM   Specimen: Anterior Nasal Swab  Result Value Ref Range Status   SARS Coronavirus 2 by RT PCR NEGATIVE NEGATIVE Final   Influenza A by PCR NEGATIVE NEGATIVE Final   Influenza B by PCR NEGATIVE NEGATIVE Final    Comment: (NOTE) The Xpert Xpress SARS-CoV-2/FLU/RSV plus assay is intended as an aid in the diagnosis of influenza from Nasopharyngeal swab specimens and should not be used as a sole  basis for treatment. Nasal washings and aspirates are unacceptable for Xpert Xpress SARS-CoV-2/FLU/RSV testing.  Fact Sheet for Patients: bloggercourse.com  Fact Sheet for Healthcare Providers: seriousbroker.it  This test is not yet approved or cleared by the [place]  FDA and has been authorized for detection and/or diagnosis of SARS-CoV-2 by FDA under an Emergency Use Authorization (EUA). This EUA will remain in effect (meaning this test can be used) for the duration of the COVID-19 declaration under Section 564(b)(1) of the Act, 21 U.S.C. section 360bbb-3(b)(1), unless the authorization is terminated or revoked.     Resp Syncytial Virus by PCR NEGATIVE NEGATIVE Final    Comment: (NOTE) Fact Sheet for Patients: bloggercourse.com  Fact Sheet for Healthcare Providers: seriousbroker.it  This test is not yet approved or cleared by the [place]  FDA and has been authorized for detection and/or diagnosis of SARS-CoV-2 by FDA under an Emergency Use Authorization (EUA). This EUA will remain in effect (meaning this test can be used) for the duration of  the COVID-19 declaration under Section 564(b)(1) of the Act, 21 U.S.C. section 360bbb-3(b)(1), unless the authorization is terminated or revoked.  Performed at [facility], [address] [place], [place] [address]   Blood culture (routine x 2)     Status: None (Preliminary result)   Collection Time: 03/18/24 11:58 AM   Specimen: BLOOD RIGHT FOREARM  Result Value Ref Range Status   Specimen Description BLOOD RIGHT FOREARM  Final   Special Requests   Final    BOTTLES DRAWN AEROBIC AND ANAEROBIC Blood Culture results may not be optimal due to an inadequate volume of blood received in culture bottles   Culture   Final    NO GROWTH 2 DAYS Performed at [facility], [address], [place], [place] [address]    Report Status PENDING  Incomplete  Urine Culture     Status: Abnormal   Collection Time: 03/18/24 12:23 PM   Specimen: Urine, Clean Catch  Result Value Ref Range Status   Specimen Description URINE, CLEAN CATCH  Final   Special Requests   Final    NONE Performed at [facility], [address] [place], [place] [address]    Culture MULTIPLE SPECIES PRESENT, SUGGEST RECOLLECTION (A)  Final   Report Status 03/19/2024 FINAL  Final  Culture, blood (single)     Status: None (Preliminary result)   Collection Time: 03/18/24  5:30 PM   Specimen: BLOOD LEFT ARM  Result Value Ref Range Status   Specimen Description BLOOD LEFT ARM  Final   Special Requests   Final    BOTTLES DRAWN AEROBIC AND ANAEROBIC Blood Culture adequate volume   Culture   Final    NO GROWTH 2 DAYS Performed at [facility], [address] [place], [place] [address]    Report Status PENDING  Incomplete     Radiology Studies: DG Chest Port 1 View Result Date: 03/19/2024 EXAM: 1 VIEW(S) XRAY OF THE CHEST 03/19/2024 09:25:00 PM COMPARISON: AP and Lateral chest 03/18/2024. CLINICAL HISTORY: Fever. FINDINGS: LUNGS AND PLEURA: There is a low inspiration compared to the prior study. Mild  streaky left infrahilar opacity is favored to be bronchovascular crowding, less likely pneumonia or aspiration. The lungs are otherwise clear. No pleural effusion. No pneumothorax. HEART AND MEDIASTINUM: There is mild cardiomegaly, stable mediastinum. No vascular congestion is seen. BONES AND SOFT TISSUES: No acute osseous abnormality. IMPRESSION: 1. Mild streaky left infrahilar opacity favored to reflect bronchovascular crowding;  pneumonia or aspiration is considered less likely. 2. Mild cardiomegaly without vascular congestion. Electronically signed by: Francis Quam MD 03/19/2024 09:32 PM EST RP Workstation: [id number]   CT CHEST ABDOMEN PELVIS W CONTRAST Result Date: 03/18/2024 CLINICAL DATA:  Sepsis. Right lower back pain with fever for the last several days. EXAM: CT CHEST, ABDOMEN, AND PELVIS WITH CONTRAST TECHNIQUE: Multidetector CT imaging of the chest, abdomen and pelvis was performed following the standard protocol during bolus administration of intravenous contrast. RADIATION DOSE REDUCTION: This exam was performed according to the departmental dose-optimization program which includes automated exposure control, adjustment of the mA and/or kV according to patient size and/or use of iterative reconstruction technique. CONTRAST:  75mL OMNIPAQUE  IOHEXOL  350 MG/ML SOLN COMPARISON:  Chest radiographs 03/18/2024. Abdominopelvic CT 03/14/2024. FINDINGS: CT CHEST FINDINGS Cardiovascular: No significant vascular findings. The heart size is normal. There is no pericardial effusion. Mediastinum/Nodes: There are no enlarged mediastinal, hilar or axillary lymph nodes. Small amount of residual thymic tissue in the anterior mediastinum, not uncommon for age. The thyroid gland, trachea and esophagus demonstrate no significant findings. Lungs/Pleura: No pleural effusion or pneumothorax. Mildly increased subsegmental atelectasis at both lung bases. No confluent airspace disease or suspicious nodularity.  Musculoskeletal/Chest wall: No chest wall mass or suspicious osseous findings. No evidence of discitis or osteomyelitis. CT ABDOMEN AND PELVIS FINDINGS Hepatobiliary: The liver is normal in density without suspicious focal abnormality. No evidence of gallstones, gallbladder wall thickening or biliary dilatation. Pancreas: Unremarkable. No pancreatic ductal dilatation or surrounding inflammatory changes. Spleen: Normal in size without focal abnormality. Adrenals/Urinary Tract: Both adrenal glands appear normal. No evidence of urinary tract calculus, suspicious renal lesion or hydronephrosis. The bladder appears normal for its degree of distention. Stomach/Bowel: No enteric contrast administered. The stomach appears unremarkable for its degree of distension. No evidence of bowel wall thickening, distention or surrounding inflammatory change. The appendix appears normal. Vascular/Lymphatic: There are no enlarged abdominal or pelvic lymph nodes. No significant vascular findings. Reproductive: Intrauterine device in place. The uterus and ovaries otherwise appear unremarkable. No adnexal mass. Other: No evidence of abdominal wall mass or hernia. No ascites or pneumoperitoneum. Musculoskeletal: No acute or significant osseous findings. No evidence of discitis or osteomyelitis. The sacroiliac joints appear unremarkable. Stable mild irregularity of the symphysis pubis, likely developmental or secondary to previous childbirth. IMPRESSION: 1. No acute findings or explanation for the patient's symptoms. 2. Mildly increased subsegmental atelectasis at both lung bases. 3. Intrauterine device in place. Electronically Signed   By: Elsie Perone M.D.   On: 03/18/2024 15:43    Scheduled Meds:  enoxaparin  (LOVENOX ) injection  40 mg Subcutaneous Q24H   Continuous Infusions:  sodium chloride  150 mL/hr at 03/19/24 2243   cefTRIAXone  (ROCEPHIN )  IV 2 g (03/20/24 0442)     LOS: 1 day   Norval Bar, MD  [facility]  03/20/2024, 12:53 PM

## 2024-03-20 NOTE — Plan of Care (Signed)

## 2024-03-20 NOTE — Progress Notes (Signed)
°   03/20/24 1003  Assess: MEWS Score  Temp 99.2 F (37.3 C)  BP 107/76  Pulse Rate (!) 115  Resp 20  SpO2 100 %  O2 Device Room Air  Assess: MEWS Score  MEWS Temp 0  MEWS Systolic 0  MEWS Pulse 2  MEWS RR 0  MEWS LOC 0  MEWS Score 2  MEWS Score Color Yellow  Assess: if the MEWS score is Yellow or Red  Were vital signs accurate and taken at a resting state? Yes  MEWS guidelines implemented  No, previously red, continue vital signs every 4 hours  Provider Notification  Provider Name/Title Tariq  Date Provider Notified 03/20/24  Time Provider Notified 1000  Method of Notification Face-to-face  Notification Reason  (HR 116, yellow muse)  Provider response See new orders  Assess: SIRS CRITERIA  SIRS Temperature  0  SIRS Respirations  0  SIRS Pulse 1  SIRS WBC 0  SIRS Score Sum  1

## 2024-03-20 NOTE — Hospital Course (Signed)
 28 y.o. female with without past medical history, not taking any prescription medications on a regular basis comes into the hospital with generalized weakness, fevers, right flank pain.  Patient has been having high fevers for the past 9 days, coming and going.  She would have couple days with fevers and feeling very weak, and then a day or 2 of feeling fairly normal and then symptoms returning.  She presented to the emergency room here on 12/5 with similar symptoms, she was diagnosed with atypical pneumonia based on a chest x-ray, also UTI given positive urinalysis, she was prescribed azithromycin  and cefpodoxime  and sent home.  She took the antibiotics for couple of days, however her symptoms persisted and newly developed right flank pain with persistent fevers so decided come back to the emergency room.  She denies any vomiting but did report some intermittent nausea.  She complains of a cough but mainly when sitting down, no sputum production.  She denies any dysuria.

## 2024-03-20 NOTE — Progress Notes (Signed)
°   03/20/24 1130  Assess: MEWS Score  Temp (!) 103.1 F (39.5 C)  BP 109/74  Pulse Rate (!) 118  Resp 20  SpO2 99 %  O2 Device Room Air  Assess: MEWS Score  MEWS Temp 2  MEWS Systolic 0  MEWS Pulse 2  MEWS RR 0  MEWS LOC 0  MEWS Score 4  MEWS Score Color Red  Assess: if the MEWS score is Yellow or Red  Were vital signs accurate and taken at a resting state? Yes  MEWS guidelines implemented  No, previously red, continue vital signs every 4 hours  Assess: SIRS CRITERIA  SIRS Temperature  1  SIRS Respirations  0  SIRS Pulse 1  SIRS WBC 0  SIRS Score Sum  2

## 2024-03-20 NOTE — Progress Notes (Signed)
 MD notified this afternoon of tachycardia and fever, red mews and blood cultures/ID consult ordered. Pt's symptoms and vitals improved after tylenol , pt receiving IV fluids. This evening pt reported feeling feverish again and temp was elevated, pt w/ increased tachycardia to 130's. MD notified- Bolus ordered and administered, zosyn IV started. Discussed possible need for transfer to telemetry unit but was determined to reassess after bolus. Report given to oncoming RN, will continue to monitor.

## 2024-03-20 NOTE — Progress Notes (Signed)
 Pharmacy Antibiotic Note  Helen White is a 28 y.o. female admitted on 03/18/2024 with sepsis.  Pharmacy has been consulted for Vancomycin dosing.  Plan: Vancomycin 2000 mg IV x 1 dose then 1000 mg IV every 12 hours.  Goal trough 15-20 mcg/mL. Estimated trough 14.7, AUC 543.7  Height: 5' 4 (162.6 cm) Weight: 93.4 kg (205 lb 14.6 oz) IBW/kg (Calculated) : 54.7  Temp (24hrs), Avg:100.6 F (38.1 C), Min:98.4 F (36.9 C), Max:103.1 F (39.5 C)  Recent Labs  Lab 03/14/24 1202 03/18/24 1054 03/19/24 0449 03/19/24 1405 03/20/24 0425 03/20/24 0954  WBC 6.2 5.6 4.0  --  4.5  --   CREATININE 0.84 0.80 0.61  --   --  0.57  LATICACIDVEN  --   --   --  1.6  --   --     Estimated Creatinine Clearance: 115 mL/min (by C-G formula based on SCr of 0.57 mg/dL).    Allergies[1]  Antimicrobials this admission: Ceftriaxone  12/9 >> 12/11 Zosyn 12/11 x 1 dose Vancomycin 12/11 >> Cefepime 12/11 >> Metronidazole 12/11 >>   Thank you for allowing pharmacy to be a part of this patients care.  Larraine Brazier, PharmD Clinical Pharmacist 03/20/2024  9:45 PM **Pharmacist phone directory can now be found on [email or web address] ([email or web address]).  Listed under [facility].       [1]  Allergies Allergen Reactions   Penicillins Hives    Tolerates cephalosporins

## 2024-03-20 NOTE — Progress Notes (Signed)
 Overnight cross coverage  Informed by RN that patient is allergic to penicillins and was given a dose of Zosyn at shift change and now having itching and hives.  No swelling of lips/tongue, respiratory distress, or wheezing/stridor reported.  Current vital signs: Temperature 100.4 F, heart rate 108, respiratory rate 18, blood pressure 111/70, and SpO2 97% on room air.  IV Benadryl  25 mg x 1 ordered and will continue to monitor closely.  May need steroids if not improving.  I have spoken to pharmacist and discontinued Zosyn.  Given recurrent fevers and tachycardia/concern for sepsis without clear source, started broad-spectrum antibiotic coverage with vancomycin, cefepime, and metronidazole.  Pharmacist has reviewed the patient's previous record and she has been able to tolerate cephalosporins in the past without any adverse reaction.  It seems day team has already consulted ID.  I have placed orders for transfer to progressive care unit for closer monitoring.  Repeat CBC and lactate ordered.

## 2024-03-21 DIAGNOSIS — T368X5A Adverse effect of other systemic antibiotics, initial encounter: Secondary | ICD-10-CM

## 2024-03-21 DIAGNOSIS — L500 Allergic urticaria: Secondary | ICD-10-CM

## 2024-03-21 DIAGNOSIS — R509 Fever, unspecified: Secondary | ICD-10-CM | POA: Diagnosis not present

## 2024-03-21 LAB — RESPIRATORY PANEL BY PCR

## 2024-03-21 LAB — ANA: Anti Nuclear Antibody (ANA): NEGATIVE

## 2024-03-21 LAB — MRSA NEXT GEN BY PCR, NASAL: MRSA by PCR Next Gen: NOT DETECTED

## 2024-03-21 LAB — RHEUMATOID FACTOR: Rheumatoid fact SerPl-aCnc: <10 [IU]/mL (ref <14.0)

## 2024-03-21 MED ORDER — SODIUM CHLORIDE 0.9 % IV SOLN
2.0000 g | INTRAVENOUS | Status: DC
  Administered 2024-03-21: 2 g via INTRAVENOUS
  Filled 2024-03-21: qty 20

## 2024-03-21 MED ORDER — METOCLOPRAMIDE HCL 5 MG/ML IJ SOLN
10.0000 mg | Freq: Three times a day (TID) | INTRAMUSCULAR | Status: DC
  Administered 2024-03-21 – 2024-03-25 (×12): 10 mg via INTRAVENOUS
  Filled 2024-03-21 (×12): qty 2

## 2024-03-21 MED ORDER — KETOROLAC TROMETHAMINE 15 MG/ML IJ SOLN
15.0000 mg | Freq: Once | INTRAMUSCULAR | Status: AC
  Administered 2024-03-21: 15 mg via INTRAVENOUS
  Filled 2024-03-21: qty 1

## 2024-03-21 NOTE — Plan of Care (Addendum)
 Patient calm and cooperative A&O X4. Patient notified nurse that s he was having hives, SOB and itching at IV site and stated that she believed it was because of the IV antibiotic Zosyn. IV pump was immediately stopped, Pharmacy was called and hospitalist Rathore notified, new orders placed changing antibiotics.  Patient temperature continued to raise, tylenol  given, Hospitalist Rathore notified of REDS MEWS. Hospitalist Rathore placed new orders for telemetry and to transfer patient to 4E. Patient's husband arrived at bedside. Patient transferred to 4E, report given to Sotero PEAK.  Problem: Education: Goal: Knowledge of General Education information will improve Description: Including pain rating scale, medication(s)/side effects and non-pharmacologic comfort measures Outcome: Progressing   Problem: Health Behavior/Discharge Planning: Goal: Ability to manage health-related needs will improve Outcome: Progressing   Problem: Coping: Goal: Level of anxiety will decrease Outcome: Progressing   Problem: Pain Managment: Goal: General experience of comfort will improve and/or be controlled Outcome: Progressing   Problem: Safety: Goal: Ability to remain free from injury will improve Outcome: Progressing   Problem: Skin Integrity: Goal: Risk for impaired skin integrity will decrease Outcome: Progressing

## 2024-03-21 NOTE — Consult Note (Signed)
 [facility]                                                                                        Patient Identification: Patient Name: Helen White MRN: [id number] Admit Date: 03/18/2024 10:14 AM Today's Date: 03/21/2024 Reason for consult: High-grade fevers Requesting provider: Dr. Cosette  Principal Problem:   Pyelonephritis   Antibiotics:  Metronidazole 12/11- Zosyn 12/11 Ceftriaxone  12/1-12/10, cefepime 12/11 Vancomycin 12/11- Total days of antibiotics 4  Lines/Hardware:  Assessment # Fevers, unclear source  - No leukocytosis, symptoms, labs and imaging not convincing for bacterial etiology - 12/11 blood culture NG in less than 24 hours - Does not seem to have pneumonia or UTI  # Hives related to zosyn  - improved after benadryl    Recommendations  - Will continue vancomycin, pharmacy to dose - DC cefepime and metronidazole, start ceftriaxone  - Follow-up blood cultures to be negative for at least 48 hours before considering to stop antibiotics - Will check RVP flu panel - Will hold off on additional labs/imaging at this time  - Universal/standard isolation precautions D/w primary team Dr Eben covering this weekend, Dr. Dennise starting Monday  Rest of the management as per the primary team. Please call with questions or concerns.  Thank you for the consult  __________________________________________________________________________________________________________ HPI and Hospital Course: 28 year old female with no significant PMH who presented to the ED on 12/9 with fever for approx 10 days with generalized weakness.  She initially had flulike symptoms on 11/28-11/30 and seem to have improved but started feeling since 12/3 with feeling unwell. Seen in the ED 4 days ago where she was presumed to have UTI/PNA and started on cefpodoxime  as well as azithromycin .  She  reports taking antibiotics as instructed without missing doses however Saturday she started having severe pain in the right flank exacerbated by any movement including fevers with Tmax 102.5.  She took Tylenol . She reported some nasal congestion, dry cough, mild nausea.  Denied any vomiting,  hematuria, dysuria, vaginal discharge or prior surgical history.   Denies chest pain or shortness of breath.  Denies headache, blurry vision or neck pain or back pain or peripheral joint pain.  Denies having any rashes though she had mild hives in her left arm after receiving Zosyn yesterday which resolved with Benadryl .  Denies any runny nose, watery eyes.  She is a stay-at-home mom, lives with her husband and 3 children ages 52 68 and 29.  Denies any recent travel or sick contact but reports her family members were sick and passing the virus among them.  Denies any pets at home.  Denies any history of any autoimmune conditions.   At ED febrile, tachycardic soft BP Labs remarkable for albumin 3.1, AST 43, ALT 57, WBC 5.6 Serum beta-hCG negative Influenza A/B/RSV/COVID-negative HIV NR Monoscreen negative  UA small bilirubin small hemoglobin more than 80 ketones, small leukocytes, negative nitrite, 30 protein, few bacteria. Urine cx with multiple spp  12/9 and 12/11 blood cx 2/2 sets NGTD  Given ceftriaxone  in the ED   ROS: General- Denies loss of appetite and loss of weight, chills +  HEENT - Denies headache, blurry vision, neck pain, sinus pain Chest - Denies any chest pain, SOB CVS- Denies any dizziness/lightheadedness, syncopal attacks, palpitations Abdomen- Denies any nausea, vomiting, abdominal pain, hematochezia and diarrhea Neuro - Denies any weakness, numbness, tingling sensation Psych - Denies any changes in mood irritability or depressive symptoms GU- Denies any burning, dysuria, hematuria or increased frequency of urination Skin - denies any rashes/lesions MSK - denies any joint pain/swelling  or restricted ROM   Past Medical History:  Diagnosis Date   Anemia    GERD (gastroesophageal reflux disease)    Migraine    Past Surgical History:  Procedure Laterality Date   NO PAST SURGERIES     Scheduled Meds:  enoxaparin  (LOVENOX ) injection  40 mg Subcutaneous Q24H   Continuous Infusions:  sodium chloride  150 mL/hr at 03/21/24 0518   ceFEPime (MAXIPIME) IV 2 g (03/21/24 0626)   metronidazole Stopped (03/20/24 2318)   vancomycin     PRN Meds:.acetaminophen  **OR** acetaminophen , ondansetron  **OR** ondansetron  (ZOFRAN ) IV, oxyCODONE   Allergies[1]  Social History   Socioeconomic History   Marital status: Married    Spouse name: Not on file   Number of children: Not on file   Years of education: Not on file   Highest education level: Not on file  Occupational History   Not on file  Tobacco Use   Smoking status: Never   Smokeless tobacco: Never  Vaping Use   Vaping status: Never Used  Substance and Sexual Activity   Alcohol use: No   Drug use: No   Sexual activity: Yes    Birth control/protection: Injection  Other Topics Concern   Not on file  Social History Narrative   Caffeine; 3 sodas daily.   Work:  [employer]line [employer]   Social Drivers of Health   Tobacco Use: Low Risk (03/18/2024)   Patient History    Smoking Tobacco Use: Never    Smokeless Tobacco Use: Never    Passive Exposure: Not on file  Financial Resource Strain: Not on file  Food Insecurity: No Food Insecurity (03/18/2024)   Epic    Worried About Programme Researcher, Broadcasting/film/video in the Last Year: Never true    Ran Out of Food in the Last Year: Never true  Transportation Needs: No Transportation Needs (03/18/2024)   Epic    Lack of Transportation (Medical): No    Lack of Transportation (Non-Medical): No  Physical Activity: Not on file  Stress: Not on file  Social Connections: Not on file  Intimate Partner Violence: Unknown (03/18/2024)   Epic    Fear of Current or Ex-Partner: No    Emotionally  Abused: No    Physically Abused: Not on file    Sexually Abused: Patient declined  Depression (EYV7-0): Not on file  Alcohol Screen: Not on file  Housing: Unknown (03/18/2024)   Epic    Unable to Pay for Housing in the Last Year: No    Number of Times Moved in the Last Year: Not on file    Homeless in the Last Year: No  Utilities: Not At Risk (03/18/2024)   Epic    Threatened with loss of utilities: No  Health Literacy: Not on file   Family History  Problem Relation Age of Onset   Healthy Mother    Migraines Mother    Healthy Father    Migraines Sister    Vitals BP 115/73 (BP Location: Right Arm)   Pulse (!) 111   Temp (!) 100.7 F (38.2 C) (  Oral)   Resp (!) 23   Ht 5' 4 (1.626 m)   Wt 93.4 kg   SpO2 98%   BMI 35.34 kg/m   Physical Exam Constitutional: Adult female sitting up in the bed, nontoxic appearing    Comments: HEENT WNL  Cardiovascular:     Rate and Rhythm: Normal rate     Heart sounds: S1 and S2  Pulmonary:     Effort: Pulmonary effort is normal.     Comments: Normal breath sounds  Abdominal:     Palpations: Abdomen is soft.     Tenderness: Nondistended and nontender  Musculoskeletal:        General: No swelling or tenderness  in peripheral joints  Skin:    Comments: No rashes or wounds or swollen lymph nodes  Neurological:     General: Awake, alert and oriented, grossly nonfocal  Psychiatric:        Mood and Affect: Mood normal.    Pertinent Microbiology Results for orders placed or performed during the hospital encounter of 03/18/24  Resp panel by RT-PCR (RSV, Flu A&B, Covid) Anterior Nasal Swab     Status: None   Collection Time: 03/18/24 10:50 AM   Specimen: Anterior Nasal Swab  Result Value Ref Range Status   SARS Coronavirus 2 by RT PCR NEGATIVE NEGATIVE Final   Influenza A by PCR NEGATIVE NEGATIVE Final   Influenza B by PCR NEGATIVE NEGATIVE Final    Comment: (NOTE) The Xpert Xpress SARS-CoV-2/FLU/RSV plus assay is intended as  an aid in the diagnosis of influenza from Nasopharyngeal swab specimens and should not be used as a sole basis for treatment. Nasal washings and aspirates are unacceptable for Xpert Xpress SARS-CoV-2/FLU/RSV testing.  Fact Sheet for Patients: bloggercourse.com  Fact Sheet for Healthcare Providers: seriousbroker.it  This test is not yet approved or cleared by the [place]  FDA and has been authorized for detection and/or diagnosis of SARS-CoV-2 by FDA under an Emergency Use Authorization (EUA). This EUA will remain in effect (meaning this test can be used) for the duration of the COVID-19 declaration under Section 564(b)(1) of the Act, 21 U.S.C. section 360bbb-3(b)(1), unless the authorization is terminated or revoked.     Resp Syncytial Virus by PCR NEGATIVE NEGATIVE Final    Comment: (NOTE) Fact Sheet for Patients: bloggercourse.com  Fact Sheet for Healthcare Providers: seriousbroker.it  This test is not yet approved or cleared by the [place]  FDA and has been authorized for detection and/or diagnosis of SARS-CoV-2 by FDA under an Emergency Use Authorization (EUA). This EUA will remain in effect (meaning this test can be used) for the duration of the COVID-19 declaration under Section 564(b)(1) of the Act, 21 U.S.C. section 360bbb-3(b)(1), unless the authorization is terminated or revoked.  Performed at [facility], [address], [place], [place] [address]   Blood culture (routine x 2)     Status: None (Preliminary result)   Collection Time: 03/18/24 11:58 AM   Specimen: BLOOD RIGHT FOREARM  Result Value Ref Range Status   Specimen Description BLOOD RIGHT FOREARM  Final   Special Requests   Final    BOTTLES DRAWN AEROBIC AND ANAEROBIC Blood Culture results may not be optimal due to an inadequate volume of blood received in culture bottles   Culture    Final    NO GROWTH 3 DAYS Performed at [facility], [address] [place], [place] [address]    Report Status PENDING  Incomplete  Urine Culture  Status: Abnormal   Collection Time: 03/18/24 12:23 PM   Specimen: Urine, Clean Catch  Result Value Ref Range Status   Specimen Description URINE, CLEAN CATCH  Final   Special Requests   Final    NONE Performed at [facility], [address], [place], [place] [address]    Culture MULTIPLE SPECIES PRESENT, SUGGEST RECOLLECTION (A)  Final   Report Status 03/19/2024 FINAL  Final  Culture, blood (single)     Status: None (Preliminary result)   Collection Time: 03/18/24  5:30 PM   Specimen: BLOOD LEFT ARM  Result Value Ref Range Status   Specimen Description BLOOD LEFT ARM  Final   Special Requests   Final    BOTTLES DRAWN AEROBIC AND ANAEROBIC Blood Culture adequate volume   Culture   Final    NO GROWTH 3 DAYS Performed at [facility], [address] [place], [place] [address]    Report Status PENDING  Incomplete  Culture, blood (Routine X 2) w Reflex to ID Panel     Status: None (Preliminary result)   Collection Time: 03/20/24  2:03 PM   Specimen: BLOOD  Result Value Ref Range Status   Specimen Description BLOOD SITE NOT SPECIFIED  Final   Special Requests   Final    BOTTLES DRAWN AEROBIC AND ANAEROBIC Blood Culture adequate volume   Culture   Final    NO GROWTH < 24 HOURS Performed at [facility], [address] [place], [place] [address]    Report Status PENDING  Incomplete  Culture, blood (Routine X 2) w Reflex to ID Panel     Status: None (Preliminary result)   Collection Time: 03/20/24  2:03 PM   Specimen: BLOOD  Result Value Ref Range Status   Specimen Description BLOOD SITE NOT SPECIFIED  Final   Special Requests   Final    BOTTLES DRAWN AEROBIC AND ANAEROBIC Blood Culture adequate volume   Culture   Final    NO GROWTH < 24 HOURS Performed at [facility], [address],  [place], [place] [address]    Report Status PENDING  Incomplete   Pertinent Lab seen by me:    Latest Ref Rng & Units 03/20/2024    9:57 PM 03/20/2024    4:25 AM 03/19/2024    4:49 AM  CBC  WBC 4.0 - 10.5 K/uL 4.3  4.5  4.0   Hemoglobin 12.0 - 15.0 g/dL 88.5  88.0  88.2   Hematocrit 36.0 - 46.0 % 34.0  35.5  34.3   Platelets 150 - 400 K/uL 183  202  197       Latest Ref Rng & Units 03/20/2024    9:54 AM 03/19/2024    4:49 AM 03/18/2024   10:54 AM  CMP  Glucose 70 - 99 mg/dL 893  93  96   BUN 6 - 20 mg/dL <5  <5  5   Creatinine 0.44 - 1.00 mg/dL 9.42  9.38  9.19   Sodium 135 - 145 mmol/L 136  137  139   Potassium 3.5 - 5.1 mmol/L 3.6  3.3  4.5   Chloride 98 - 111 mmol/L 106  108  104   CO2 22 - 32 mmol/L 22  26  26    Calcium 8.9 - 10.3 mg/dL 7.5  7.6  9.0   Total Protein 6.5 - 8.1 g/dL  5.4  7.3   Total Bilirubin 0.0 - 1.2 mg/dL  0.7  1.0   Alkaline Phos 38 - 126 U/L  55  73   AST 15 - 41 U/L  40  43   ALT 0 - 44 U/L  45  57      Pertinent Imagings/Other Imagings Plain films and CT images have been personally visualized and interpreted; radiology reports have been reviewed. Decision making incorporated into the Impression / Recommendations.  DG Chest Port 1 View Result Date: 03/19/2024 EXAM: 1 VIEW(S) XRAY OF THE CHEST 03/19/2024 09:25:00 PM COMPARISON: AP and Lateral chest 03/18/2024. CLINICAL HISTORY: Fever. FINDINGS: LUNGS AND PLEURA: There is a low inspiration compared to the prior study. Mild streaky left infrahilar opacity is favored to be bronchovascular crowding, less likely pneumonia or aspiration. The lungs are otherwise clear. No pleural effusion. No pneumothorax. HEART AND MEDIASTINUM: There is mild cardiomegaly, stable mediastinum. No vascular congestion is seen. BONES AND SOFT TISSUES: No acute osseous abnormality. IMPRESSION: 1. Mild streaky left infrahilar opacity favored to reflect bronchovascular crowding; pneumonia or aspiration is considered less likely. 2. Mild  cardiomegaly without vascular congestion. Electronically signed by: Francis Quam MD 03/19/2024 09:32 PM EST RP Workstation: [id number]   CT CHEST ABDOMEN PELVIS W CONTRAST Result Date: 03/18/2024 CLINICAL DATA:  Sepsis. Right lower back pain with fever for the last several days. EXAM: CT CHEST, ABDOMEN, AND PELVIS WITH CONTRAST TECHNIQUE: Multidetector CT imaging of the chest, abdomen and pelvis was performed following the standard protocol during bolus administration of intravenous contrast. RADIATION DOSE REDUCTION: This exam was performed according to the departmental dose-optimization program which includes automated exposure control, adjustment of the mA and/or kV according to patient size and/or use of iterative reconstruction technique. CONTRAST:  75mL OMNIPAQUE  IOHEXOL  350 MG/ML SOLN COMPARISON:  Chest radiographs 03/18/2024. Abdominopelvic CT 03/14/2024. FINDINGS: CT CHEST FINDINGS Cardiovascular: No significant vascular findings. The heart size is normal. There is no pericardial effusion. Mediastinum/Nodes: There are no enlarged mediastinal, hilar or axillary lymph nodes. Small amount of residual thymic tissue in the anterior mediastinum, not uncommon for age. The thyroid gland, trachea and esophagus demonstrate no significant findings. Lungs/Pleura: No pleural effusion or pneumothorax. Mildly increased subsegmental atelectasis at both lung bases. No confluent airspace disease or suspicious nodularity. Musculoskeletal/Chest wall: No chest wall mass or suspicious osseous findings. No evidence of discitis or osteomyelitis. CT ABDOMEN AND PELVIS FINDINGS Hepatobiliary: The liver is normal in density without suspicious focal abnormality. No evidence of gallstones, gallbladder wall thickening or biliary dilatation. Pancreas: Unremarkable. No pancreatic ductal dilatation or surrounding inflammatory changes. Spleen: Normal in size without focal abnormality. Adrenals/Urinary Tract: Both adrenal glands appear  normal. No evidence of urinary tract calculus, suspicious renal lesion or hydronephrosis. The bladder appears normal for its degree of distention. Stomach/Bowel: No enteric contrast administered. The stomach appears unremarkable for its degree of distension. No evidence of bowel wall thickening, distention or surrounding inflammatory change. The appendix appears normal. Vascular/Lymphatic: There are no enlarged abdominal or pelvic lymph nodes. No significant vascular findings. Reproductive: Intrauterine device in place. The uterus and ovaries otherwise appear unremarkable. No adnexal mass. Other: No evidence of abdominal wall mass or hernia. No ascites or pneumoperitoneum. Musculoskeletal: No acute or significant osseous findings. No evidence of discitis or osteomyelitis. The sacroiliac joints appear unremarkable. Stable mild irregularity of the symphysis pubis, likely developmental or secondary to previous childbirth. IMPRESSION: 1. No acute findings or explanation for the patient's symptoms. 2. Mildly increased subsegmental atelectasis at both lung bases. 3. Intrauterine device in place. Electronically Signed   By: Elsie Perone  M.D.   On: 03/18/2024 15:43   DG Chest 2 View Result Date: 03/18/2024 CLINICAL DATA:  Shortness of breath. EXAM: CHEST - 2 VIEW COMPARISON:  03/14/2024 FINDINGS: The heart size and mediastinal contours are within normal limits. Both lungs are clear. Subtle interstitial opacity seen previously not evident on the current exam. The visualized skeletal structures are unremarkable. IMPRESSION: No active cardiopulmonary disease. Electronically Signed   By: Camellia Candle M.D.   On: 03/18/2024 11:35   CT ABDOMEN PELVIS WO CONTRAST Result Date: 03/14/2024 EXAM: CT ABDOMEN AND PELVIS WITHOUT CONTRAST 03/14/2024 04:41:16 PM TECHNIQUE: CT of the abdomen and pelvis was performed without the administration of intravenous contrast. Multiplanar reformatted images are provided for review. Automated  exposure control, iterative reconstruction, and/or weight-based adjustment of the mA/kV was utilized to reduce the radiation dose to as low as reasonably achievable. COMPARISON: None available. CLINICAL HISTORY: rule out kidney stone right side FINDINGS: LOWER CHEST: No acute abnormality. LIVER: The liver is unremarkable. GALLBLADDER AND BILE DUCTS: Gallbladder is unremarkable. No biliary ductal dilatation. SPLEEN: No acute abnormality. PANCREAS: No acute abnormality. ADRENAL GLANDS: No acute abnormality. KIDNEYS, URETERS AND BLADDER: No stones in the kidneys or ureters. No hydronephrosis. No perinephric or periureteral stranding. Urinary bladder is unremarkable. GI AND BOWEL: Stomach demonstrates no acute abnormality. There is no bowel obstruction. PERITONEUM AND RETROPERITONEUM: No ascites. No free air. VASCULATURE: Aorta is normal in caliber. LYMPH NODES: No lymphadenopathy. REPRODUCTIVE ORGANS: Intrauterine device is noted. BONES AND SOFT TISSUES: No acute osseous abnormality. No focal soft tissue abnormality. IMPRESSION: 1. No acute findings in the abdomen or pelvis. Electronically signed by: Lynwood Seip MD 03/14/2024 04:58 PM EST RP Workstation: [id number]   DG Chest 2 View Result Date: 03/14/2024 EXAM: 2 VIEW(S) XRAY OF THE CHEST 03/14/2024 10:26:43 AM COMPARISON: Portable chest x ray 07/02/2018. CLINICAL HISTORY: 28 year old female with fever and cough. FINDINGS: LUNGS AND PLEURA: Mild generalized increased pulmonary interstitium. No confluent lung opacity. No pleural effusion. No pneumothorax. HEART AND MEDIASTINUM: No acute abnormality of the cardiac and mediastinal silhouettes. BONES AND SOFT TISSUES: No acute osseous abnormality. IMPRESSION: 1. Mild generalized increased pulmonary interstitium, suspicious for acute viral / atypical respiratory infection in this setting. Electronically signed by: Helayne Hurst MD 03/14/2024 10:59 AM EST RP Workstation: [id number]   I spent 80 minutes involved in  face-to-face and non-face-to-face activities for this patient on the day of the visit. Professional time spent includes the following activities: Preparing to see the patient (review of tests), Obtaining and reviewing separately obtained history (ED note, H&P, hospitalist progress note), Performing a medically appropriate examination and evaluation , Ordering medications/labs, referring and communicating with other health care professionals, Documenting clinical information in the EMR, Independently interpreting results (not separately reported), Communicating results to the patient, Counseling and educating the patient  and Care coordination (not separately reported).  Electronically signed by:   Plan d/w requesting provider as well as ID pharm D  Of note, portions of this note may have been created with voice recognition software. While this note has been edited for accuracy, occasional wrong-word or sound-a-like substitutions may have occurred due to the inherent limitations of voice recognition software.   Annalee Orem, MD Infectious Disease Physician [facility] Pager: [phone]      [1]  Allergies Allergen Reactions   Penicillins Hives    Tolerates cephalosporins

## 2024-03-21 NOTE — Progress Notes (Signed)
 When the Pt got to 4E, she had received 650 mg of Tylenol  an hour before but her fever still did not break. Her temperature was 102.9. BP 100/48. HR 123. Skin was very warm to the touch.  Paged on call physician with no response.  Placed a cold bag on each arm and limb, and placed cold cloths on her forehead and neck.  Restarted patient on IV normal saline at 150 mL/hr. After an hour her vital were rechecked and her fever had broken. Temp 99.7, BP 96/60, and HR 107.  Will continue to monitor.  Kristene Sotero BRAVO, RN

## 2024-03-21 NOTE — Progress Notes (Signed)
°   03/21/24 1943  Assess: MEWS Score  Temp (!) 100.5 F (38.1 C)  BP 106/66  MAP (mmHg) 78  Pulse Rate (!) 121  ECG Heart Rate (!) 121  Resp 20  SpO2 93 %  Assess: MEWS Score  MEWS Temp 1  MEWS Systolic 0  MEWS Pulse 2  MEWS RR 0  MEWS LOC 0  MEWS Score 3  MEWS Score Color Yellow  Assess: if the MEWS score is Yellow or Red  Were vital signs accurate and taken at a resting state? Yes  MEWS guidelines implemented  No, previously yellow, continue vital signs every 4 hours  Notify: Charge Nurse/RN  Name of Charge Nurse/RN Notified Arland  Assess: SIRS CRITERIA  SIRS Temperature  0  SIRS Respirations  0  SIRS Pulse 1  SIRS WBC 0  SIRS Score Sum  1

## 2024-03-21 NOTE — Plan of Care (Signed)
   Problem: Education: Goal: Knowledge of General Education information will improve Description Including pain rating scale, medication(s)/side effects and non-pharmacologic comfort measures Outcome: Progressing

## 2024-03-21 NOTE — Progress Notes (Signed)
 PROGRESS NOTE    Helen White  FMW:[id number] DOB: 04-Feb-1996 DOA: 03/18/2024 PCP: Patient, No Pcp Per  Subjective: Noted to have itching and hives develop following one dose of zosyn for which benadryl  given with improvement and switched to cefepime and metronidazole plus vancomycin added. Seen and examined at bedside. No new complaints. Still with intermittent shivering. Continues to have poor oral intake despite no nausea or vomiting. Denies constipation.    Hospital Course: 28 y.o. female with without past medical history, not taking any prescription medications on a regular basis comes into the hospital with generalized weakness, fevers, right flank pain.  Patient has been having high fevers for the past 9 days, coming and going.  She would have couple days with fevers and feeling very weak, and then a day or 2 of feeling fairly normal and then symptoms returning.  She presented to the emergency room here on 12/5 with similar symptoms, she was diagnosed with atypical pneumonia based on a chest x-ray, also UTI given positive urinalysis, she was prescribed azithromycin  and cefpodoxime  and sent home.  She took the antibiotics for couple of days, however her symptoms persisted and newly developed right flank pain with persistent fevers so decided come back to the emergency room.  She denies any vomiting but did report some intermittent nausea.  She complains of a cough but mainly when sitting down, no sputum production.  She denies any dysuria.    Assessment and Plan:  Sepsis of unclear etiology - concern for likely viral illness - no recent travel, sick contacts, medication changes - continues to have intermittent fevers - covid/flu/RSV negative - HIV screen negative - CRP 5.9, ESR 5 - monospot test negative - CXR unremarkable - UA unremarkable, however, has been on antibiotics recently - CT C/A/P on 12/9 unremarkable - repeat Bcx 12/11 x2 with NGTD, final read pending - status  post sepsis fluids in ED/admission - cont IV fluids for now - cont vancomycin - stop metronidazole - switch cepefime to ceftriaxone  as per ID recommendations - ANA, RF pending - respiratory viral panel pending - MRSA screen pending - plan to stop Abx if repeat Bcx remain negative for 48 hours - ID following    Poor oral intake/Hypotension - etiology unclear, likely in the setting of sepsis and poor oral intake - status post sepsis fluids as elsewhere - keep on continuous fluids until PO intake improves - encourage oral intake/hydration - lactate not elevated   Sinus tachycardia - EKG 12/11 confirmed - continue IV fluids as elsewhere for sepsis picture - monitor clinically  DVT prophylaxis: enoxaparin  (LOVENOX ) injection 40 mg Start: 03/18/24 2000  SCDs   Code Status: Full Code  Disposition Plan: Home Reason for continuing need for hospitalization: IV fluids, IV antibiotics, ID recommendations, monitor for adequate oral intake  Objective: Vitals:   03/21/24 0320 03/21/24 0620 03/21/24 0636 03/21/24 0845  BP: 109/66 115/73  106/61  Pulse: (!) 104 (!) 118 (!) 111 (!) 109  Resp: (!) 23 (!) 26 (!) 23 (!) 32  Temp: 99.4 F (37.4 C) (!) 100.7 F (38.2 C)  99.1 F (37.3 C)  TempSrc: Oral Oral  Oral  SpO2: 94% 100% 98% 95%  Weight:      Height:        Intake/Output Summary (Last 24 hours) at 03/21/2024 1135 Last data filed at 03/21/2024 0518 Gross per 24 hour  Intake 2696.75 ml  Output --  Net 2696.75 ml   Filed Weights   03/18/24 1822  Weight: 93.4 kg    Examination:  Physical Exam Vitals and nursing note reviewed.  Constitutional:      General: She is not in acute distress.    Appearance: She is ill-appearing.  HENT:     Head: Normocephalic and atraumatic.  Cardiovascular:     Rate and Rhythm: Normal rate and regular rhythm.     Pulses: Normal pulses.  Pulmonary:     Effort: Pulmonary effort is normal.     Breath sounds: Normal breath sounds.   Abdominal:     General: Bowel sounds are normal.     Palpations: Abdomen is soft.  Neurological:     Mental Status: She is alert.     Data Reviewed: I have personally reviewed following labs and imaging studies  CBC: Recent Labs  Lab 03/14/24 1202 03/18/24 1054 03/19/24 0449 03/20/24 0425 03/20/24 2157  WBC 6.2 5.6 4.0 4.5 4.3  NEUTROABS 4.6  --   --  2.4  --   HGB 14.7 14.3 11.7* 11.9* 11.4*  HCT 44.5 42.7 34.3* 35.5* 34.0*  MCV 86.1 85.1 84.9 83.5 84.4  PLT 227 230 197 202 183   Basic Metabolic Panel: Recent Labs  Lab 03/14/24 1202 03/18/24 1054 03/19/24 0449 03/20/24 0954  NA 134* 139 137 136  K 3.5 4.5 3.3* 3.6  CL 103 104 108 106  CO2 19* 26 26 22   GLUCOSE 108* 96 93 106*  BUN 7 5* <5* <5*  CREATININE 0.84 0.80 0.61 0.57  CALCIUM 8.7* 9.0 7.6* 7.5*   GFR: Estimated Creatinine Clearance: 115 mL/min (by C-G formula based on SCr of 0.57 mg/dL). Liver Function Tests: Recent Labs  Lab 03/14/24 1202 03/18/24 1054 03/19/24 0449  AST 60* 43* 40  ALT 95* 57* 45*  ALKPHOS 93 73 55  BILITOT 1.0 1.0 0.7  PROT 7.7 7.3 5.4*  ALBUMIN 3.5 3.1* 2.2*   Recent Labs  Lab 03/14/24 1202 03/18/24 1054  LIPASE 28 25   No results for input(s): AMMONIA in the last 168 hours. Coagulation Profile: Recent Labs  Lab 03/14/24 1202  INR 1.1   Cardiac Enzymes: No results for input(s): CKTOTAL, CKMB, CKMBINDEX, TROPONINI in the last 168 hours. ProBNP, BNP (last 5 results) No results for input(s): PROBNP, BNP in the last 8760 hours. HbA1C: No results for input(s): HGBA1C in the last 72 hours. CBG: No results for input(s): GLUCAP in the last 168 hours. Lipid Profile: No results for input(s): CHOL, HDL, LDLCALC, TRIG, CHOLHDL, LDLDIRECT in the last 72 hours. Thyroid Function Tests: No results for input(s): TSH, T4TOTAL, FREET4, T3FREE, THYROIDAB in the last 72 hours. Anemia Panel: No results for input(s): VITAMINB12,  FOLATE, FERRITIN, TIBC, IRON, RETICCTPCT in the last 72 hours. Sepsis Labs: Recent Labs  Lab 03/19/24 1405 03/19/24 2128 03/20/24 2157  PROCALCITON  --  0.11  --   LATICACIDVEN 1.6  --  0.8    Recent Results (from the past 240 hours)  Resp panel by RT-PCR (RSV, Flu A&B, Covid) Anterior Nasal Swab     Status: None   Collection Time: 03/18/24 10:50 AM   Specimen: Anterior Nasal Swab  Result Value Ref Range Status   SARS Coronavirus 2 by RT PCR NEGATIVE NEGATIVE Final   Influenza A by PCR NEGATIVE NEGATIVE Final   Influenza B by PCR NEGATIVE NEGATIVE Final    Comment: (NOTE) The Xpert Xpress SARS-CoV-2/FLU/RSV plus assay is intended as an aid in the diagnosis of influenza from Nasopharyngeal swab specimens and should not be used  as a sole basis for treatment. Nasal washings and aspirates are unacceptable for Xpert Xpress SARS-CoV-2/FLU/RSV testing.  Fact Sheet for Patients: bloggercourse.com  Fact Sheet for Healthcare Providers: seriousbroker.it  This test is not yet approved or cleared by the [place]  FDA and has been authorized for detection and/or diagnosis of SARS-CoV-2 by FDA under an Emergency Use Authorization (EUA). This EUA will remain in effect (meaning this test can be used) for the duration of the COVID-19 declaration under Section 564(b)(1) of the Act, 21 U.S.C. section 360bbb-3(b)(1), unless the authorization is terminated or revoked.     Resp Syncytial Virus by PCR NEGATIVE NEGATIVE Final    Comment: (NOTE) Fact Sheet for Patients: bloggercourse.com  Fact Sheet for Healthcare Providers: seriousbroker.it  This test is not yet approved or cleared by the [place]  FDA and has been authorized for detection and/or diagnosis of SARS-CoV-2 by FDA under an Emergency Use Authorization (EUA). This EUA will remain in effect (meaning this test can be  used) for the duration of the COVID-19 declaration under Section 564(b)(1) of the Act, 21 U.S.C. section 360bbb-3(b)(1), unless the authorization is terminated or revoked.  Performed at [facility], [address] [place], [place] [address]   Blood culture (routine x 2)     Status: None (Preliminary result)   Collection Time: 03/18/24 11:58 AM   Specimen: BLOOD RIGHT FOREARM  Result Value Ref Range Status   Specimen Description BLOOD RIGHT FOREARM  Final   Special Requests   Final    BOTTLES DRAWN AEROBIC AND ANAEROBIC Blood Culture results may not be optimal due to an inadequate volume of blood received in culture bottles   Culture   Final    NO GROWTH 3 DAYS Performed at [facility], [address] [place], [place] [address]    Report Status PENDING  Incomplete  Urine Culture     Status: Abnormal   Collection Time: 03/18/24 12:23 PM   Specimen: Urine, Clean Catch  Result Value Ref Range Status   Specimen Description URINE, CLEAN CATCH  Final   Special Requests   Final    NONE Performed at [facility], [address], [place], [place] [address]    Culture MULTIPLE SPECIES PRESENT, SUGGEST RECOLLECTION (A)  Final   Report Status 03/19/2024 FINAL  Final  Culture, blood (single)     Status: None (Preliminary result)   Collection Time: 03/18/24  5:30 PM   Specimen: BLOOD LEFT ARM  Result Value Ref Range Status   Specimen Description BLOOD LEFT ARM  Final   Special Requests   Final    BOTTLES DRAWN AEROBIC AND ANAEROBIC Blood Culture adequate volume   Culture   Final    NO GROWTH 3 DAYS Performed at [facility], [address] [place], [place] [address]    Report Status PENDING  Incomplete  Culture, blood (Routine X 2) w Reflex to ID Panel     Status: None (Preliminary result)   Collection Time: 03/20/24  2:03 PM   Specimen: BLOOD  Result Value Ref Range Status   Specimen Description BLOOD SITE NOT SPECIFIED  Final   Special Requests   Final     BOTTLES DRAWN AEROBIC AND ANAEROBIC Blood Culture adequate volume   Culture   Final    NO GROWTH < 24 HOURS Performed at [facility], [address] [place], [place] [address]    Report Status PENDING  Incomplete  Culture, blood (  Routine X 2) w Reflex to ID Panel     Status: None (Preliminary result)   Collection Time: 03/20/24  2:03 PM   Specimen: BLOOD  Result Value Ref Range Status   Specimen Description BLOOD SITE NOT SPECIFIED  Final   Special Requests   Final    BOTTLES DRAWN AEROBIC AND ANAEROBIC Blood Culture adequate volume   Culture   Final    NO GROWTH < 24 HOURS Performed at [facility], [address], [place], [place] [address]    Report Status PENDING  Incomplete     Radiology Studies: DG Chest Port 1 View Result Date: 03/19/2024 EXAM: 1 VIEW(S) XRAY OF THE CHEST 03/19/2024 09:25:00 PM COMPARISON: AP and Lateral chest 03/18/2024. CLINICAL HISTORY: Fever. FINDINGS: LUNGS AND PLEURA: There is a low inspiration compared to the prior study. Mild streaky left infrahilar opacity is favored to be bronchovascular crowding, less likely pneumonia or aspiration. The lungs are otherwise clear. No pleural effusion. No pneumothorax. HEART AND MEDIASTINUM: There is mild cardiomegaly, stable mediastinum. No vascular congestion is seen. BONES AND SOFT TISSUES: No acute osseous abnormality. IMPRESSION: 1. Mild streaky left infrahilar opacity favored to reflect bronchovascular crowding; pneumonia or aspiration is considered less likely. 2. Mild cardiomegaly without vascular congestion. Electronically signed by: Francis Quam MD 03/19/2024 09:32 PM EST RP Workstation: [id number]    Scheduled Meds:  enoxaparin  (LOVENOX ) injection  40 mg Subcutaneous Q24H   Continuous Infusions:  sodium chloride  150 mL/hr at 03/21/24 0924   cefTRIAXone  (ROCEPHIN )  IV     vancomycin 1,000 mg (03/21/24 1045)     LOS: 2 days   Norval Bar, MD  [facility]  03/21/2024, 11:35  AM

## 2024-03-21 NOTE — Progress Notes (Signed)
 Overnight cross coverage  Informed by RN that patient is refusing Lovenox /anticoagulation for DVT prophylaxis.  Reviewed daytime physician's note, no lower extremity edema or concern for DVT mentioned.  As such, I have ordered SCDs for DVT prophylaxis.

## 2024-03-22 DIAGNOSIS — N12 Tubulo-interstitial nephritis, not specified as acute or chronic: Secondary | ICD-10-CM | POA: Diagnosis not present

## 2024-03-22 LAB — FERRITIN: Ferritin: 209 ng/mL (ref 11–307)

## 2024-03-22 MED ORDER — LACTATED RINGERS IV BOLUS
1000.0000 mL | Freq: Once | INTRAVENOUS | Status: AC
  Administered 2024-03-22: 1000 mL via INTRAVENOUS

## 2024-03-22 MED ORDER — LACTATED RINGERS IV SOLN: INTRAVENOUS | Status: DC

## 2024-03-22 MED ORDER — IBUPROFEN 200 MG PO TABS
400.0000 mg | ORAL_TABLET | Freq: Once | ORAL | Status: AC | PRN
  Administered 2024-03-22: 400 mg via ORAL
  Filled 2024-03-22: qty 2

## 2024-03-22 NOTE — Progress Notes (Signed)
 PROGRESS NOTE    Helen White  FMW:[id number] DOB: 20-Jun-1995 DOA: 03/18/2024 PCP: Patient, No Pcp Per  Subjective: Patient did not want to get lovenox  injections so switched to SCDs for DVT Ppx overnight. Seen and examined at bedside with aunt present. Reports still having fevers with last episode early this morning. As per aunt, concern her nause worsens shortly after receiving IV antibiotics. Reports having better appetite today and tolerating a bagel during this encounter. Denies any vomiting or constipation.    Hospital Course: 28 y.o. female with without past medical history, not taking any prescription medications on a regular basis comes into the hospital with generalized weakness, fevers, right flank pain.  Patient has been having high fevers for the past 9 days, coming and going.  She would have couple days with fevers and feeling very weak, and then a day or 2 of feeling fairly normal and then symptoms returning.  She presented to the emergency room here on 12/5 with similar symptoms, she was diagnosed with atypical pneumonia based on a chest x-ray, also UTI given positive urinalysis, she was prescribed azithromycin  and cefpodoxime  and sent home.  She took the antibiotics for couple of days, however her symptoms persisted and newly developed right flank pain with persistent fevers so decided come back to the emergency room.  She denies any vomiting but did report some intermittent nausea.  She complains of a cough but mainly when sitting down, no sputum production.  She denies any dysuria.    Assessment and Plan:  Sepsis of unclear etiology - concern for likely viral illness - no recent travel, sick contacts, medication changes - continues to have intermittent fevers - covid/flu/RSV negative - HIV screen negative - CRP 5.9, ESR 5 - monospot test negative - respiratory viral panel negative - MRSA screen negative - CXR unremarkable - UA unremarkable, however, has been on  antibiotics recently - ANA negative, RF negative, ferritin not elevated - CT C/A/P on 12/9 unremarkable - repeat Bcx 12/11 x2 with NGTD, final read pending - status post sepsis fluids in ED/admission - stop all antibiotics now - cont IV fluids until PO intake consistently good - will check serology for EBV, CMV, toxoplasmosis, ehrlichia - will check RPR and ACE level - ID following    Poor oral intake/Hypotension - etiology unclear, likely in the setting of sepsis and poor oral intake - status post sepsis fluids as elsewhere - keep on continuous fluids until PO intake improves - encourage oral intake/hydration - lactate not elevated   Sinus tachycardia - EKG 12/11 confirmed - continue IV fluids as elsewhere for sepsis picture until PO intake consistently good - monitor clinically  DVT prophylaxis: Place and maintain sequential compression device Start: 03/21/24 2121 enoxaparin  (LOVENOX ) injection 40 mg Start: 03/18/24 2000  SCDs   Code Status: Full Code Family Communication: updated aunt at bedside on treatment plan Disposition Plan: Home Reason for continuing need for hospitalization: monitor for recurrent fevers and oral intake/hydration, infectious workup, ID team recommendations  Objective: Vitals:   03/22/24 0522 03/22/24 0549 03/22/24 0622 03/22/24 0727  BP: 102/62   101/60  Pulse: (!) 130 (!) 115 (!) 112 (!) 126  Resp: (!) 22 20 20  (!) 23  Temp: 99.3 F (37.4 C)   98 F (36.7 C)  TempSrc: Oral   Oral  SpO2: 96% 96% 96% 96%  Weight:      Height:        Intake/Output Summary (Last 24 hours) at 03/22/2024 1248 Last  data filed at 03/22/2024 0111 Gross per 24 hour  Intake 2759.8 ml  Output --  Net 2759.8 ml   Filed Weights   03/18/24 1822  Weight: 93.4 kg    Examination:  Physical Exam Vitals and nursing note reviewed.  Constitutional:      General: She is not in acute distress.    Appearance: She is ill-appearing.  HENT:     Head: Normocephalic  and atraumatic.  Cardiovascular:     Rate and Rhythm: Normal rate and regular rhythm.     Pulses: Normal pulses.     Heart sounds: Normal heart sounds.  Pulmonary:     Effort: Pulmonary effort is normal.     Breath sounds: Normal breath sounds.  Abdominal:     General: Bowel sounds are normal.     Palpations: Abdomen is soft.  Neurological:     Mental Status: She is alert.     Data Reviewed: I have personally reviewed following labs and imaging studies  CBC: Recent Labs  Lab 03/18/24 1054 03/19/24 0449 03/20/24 0425 03/20/24 2157  WBC 5.6 4.0 4.5 4.3  NEUTROABS  --   --  2.4  --   HGB 14.3 11.7* 11.9* 11.4*  HCT 42.7 34.3* 35.5* 34.0*  MCV 85.1 84.9 83.5 84.4  PLT 230 197 202 183   Basic Metabolic Panel: Recent Labs  Lab 03/18/24 1054 03/19/24 0449 03/20/24 0954  NA 139 137 136  K 4.5 3.3* 3.6  CL 104 108 106  CO2 26 26 22   GLUCOSE 96 93 106*  BUN 5* <5* <5*  CREATININE 0.80 0.61 0.57  CALCIUM 9.0 7.6* 7.5*   GFR: Estimated Creatinine Clearance: 115 mL/min (by C-G formula based on SCr of 0.57 mg/dL). Liver Function Tests: Recent Labs  Lab 03/18/24 1054 03/19/24 0449  AST 43* 40  ALT 57* 45*  ALKPHOS 73 55  BILITOT 1.0 0.7  PROT 7.3 5.4*  ALBUMIN 3.1* 2.2*   Recent Labs  Lab 03/18/24 1054  LIPASE 25   No results for input(s): AMMONIA in the last 168 hours. Coagulation Profile: No results for input(s): INR, PROTIME in the last 168 hours. Cardiac Enzymes: No results for input(s): CKTOTAL, CKMB, CKMBINDEX, TROPONINI in the last 168 hours. ProBNP, BNP (last 5 results) No results for input(s): PROBNP, BNP in the last 8760 hours. HbA1C: No results for input(s): HGBA1C in the last 72 hours. CBG: No results for input(s): GLUCAP in the last 168 hours. Lipid Profile: No results for input(s): CHOL, HDL, LDLCALC, TRIG, CHOLHDL, LDLDIRECT in the last 72 hours. Thyroid Function Tests: No results for input(s): TSH,  T4TOTAL, FREET4, T3FREE, THYROIDAB in the last 72 hours. Anemia Panel: Recent Labs    03/22/24 0250  FERRITIN 209   Sepsis Labs: Recent Labs  Lab 03/19/24 1405 03/19/24 2128 03/20/24 2157  PROCALCITON  --  0.11  --   LATICACIDVEN 1.6  --  0.8    Recent Results (from the past 240 hours)  Resp panel by RT-PCR (RSV, Flu A&B, Covid) Anterior Nasal Swab     Status: None   Collection Time: 03/18/24 10:50 AM   Specimen: Anterior Nasal Swab  Result Value Ref Range Status   SARS Coronavirus 2 by RT PCR NEGATIVE NEGATIVE Final   Influenza A by PCR NEGATIVE NEGATIVE Final   Influenza B by PCR NEGATIVE NEGATIVE Final    Comment: (NOTE) The Xpert Xpress SARS-CoV-2/FLU/RSV plus assay is intended as an aid in the diagnosis of influenza from Nasopharyngeal swab  specimens and should not be used as a sole basis for treatment. Nasal washings and aspirates are unacceptable for Xpert Xpress SARS-CoV-2/FLU/RSV testing.  Fact Sheet for Patients: bloggercourse.com  Fact Sheet for Healthcare Providers: seriousbroker.it  This test is not yet approved or cleared by the [place]  FDA and has been authorized for detection and/or diagnosis of SARS-CoV-2 by FDA under an Emergency Use Authorization (EUA). This EUA will remain in effect (meaning this test can be used) for the duration of the COVID-19 declaration under Section 564(b)(1) of the Act, 21 U.S.C. section 360bbb-3(b)(1), unless the authorization is terminated or revoked.     Resp Syncytial Virus by PCR NEGATIVE NEGATIVE Final    Comment: (NOTE) Fact Sheet for Patients: bloggercourse.com  Fact Sheet for Healthcare Providers: seriousbroker.it  This test is not yet approved or cleared by the [place]  FDA and has been authorized for detection and/or diagnosis of SARS-CoV-2 by FDA under an Emergency Use Authorization (EUA).  This EUA will remain in effect (meaning this test can be used) for the duration of the COVID-19 declaration under Section 564(b)(1) of the Act, 21 U.S.C. section 360bbb-3(b)(1), unless the authorization is terminated or revoked.  Performed at [facility], [address], [place], [place] [address]   Blood culture (routine x 2)     Status: None (Preliminary result)   Collection Time: 03/18/24 11:58 AM   Specimen: BLOOD RIGHT FOREARM  Result Value Ref Range Status   Specimen Description BLOOD RIGHT FOREARM  Final   Special Requests   Final    BOTTLES DRAWN AEROBIC AND ANAEROBIC Blood Culture results may not be optimal due to an inadequate volume of blood received in culture bottles   Culture   Final    NO GROWTH 4 DAYS Performed at [facility], [address] [place], [place] [address]    Report Status PENDING  Incomplete  Urine Culture     Status: Abnormal   Collection Time: 03/18/24 12:23 PM   Specimen: Urine, Clean Catch  Result Value Ref Range Status   Specimen Description URINE, CLEAN CATCH  Final   Special Requests   Final    NONE Performed at [facility], [address], [place], [place] [address]    Culture MULTIPLE SPECIES PRESENT, SUGGEST RECOLLECTION (A)  Final   Report Status 03/19/2024 FINAL  Final  Culture, blood (single)     Status: None (Preliminary result)   Collection Time: 03/18/24  5:30 PM   Specimen: BLOOD LEFT ARM  Result Value Ref Range Status   Specimen Description BLOOD LEFT ARM  Final   Special Requests   Final    BOTTLES DRAWN AEROBIC AND ANAEROBIC Blood Culture adequate volume   Culture   Final    NO GROWTH 4 DAYS Performed at [facility], [address], [place], [place] [address]    Report Status PENDING  Incomplete  Culture, blood (Routine X 2) w Reflex to ID Panel     Status: None (Preliminary result)   Collection Time: 03/20/24  2:03 PM   Specimen: BLOOD  Result Value Ref Range Status   Specimen Description  BLOOD SITE NOT SPECIFIED  Final   Special Requests   Final    BOTTLES DRAWN AEROBIC AND ANAEROBIC Blood Culture adequate volume   Culture   Final    NO GROWTH 2 DAYS Performed at [facility], [address] [place], [place] [address]    Report Status PENDING  Incomplete  Culture, blood (Routine X 2) w Reflex to ID Panel     Status: None (Preliminary result)   Collection Time: 03/20/24  2:03 PM   Specimen: BLOOD  Result Value Ref Range Status   Specimen Description BLOOD SITE NOT SPECIFIED  Final   Special Requests   Final    BOTTLES DRAWN AEROBIC AND ANAEROBIC Blood Culture adequate volume   Culture   Final    NO GROWTH 2 DAYS Performed at [facility], [address], [place], [place] [address]    Report Status PENDING  Incomplete  Respiratory (~20 pathogens) panel by PCR     Status: None   Collection Time: 03/21/24  9:48 AM   Specimen: Nasopharyngeal Swab; Respiratory  Result Value Ref Range Status   Adenovirus NOT DETECTED NOT DETECTED Final   Coronavirus 229E NOT DETECTED NOT DETECTED Final    Comment: (NOTE) The Coronavirus on the Respiratory Panel, DOES NOT test for the novel  Coronavirus (2019 nCoV)    Coronavirus HKU1 NOT DETECTED NOT DETECTED Final   Coronavirus NL63 NOT DETECTED NOT DETECTED Final   Coronavirus OC43 NOT DETECTED NOT DETECTED Final   Metapneumovirus NOT DETECTED NOT DETECTED Final   Rhinovirus / Enterovirus NOT DETECTED NOT DETECTED Final   Influenza A NOT DETECTED NOT DETECTED Final   Influenza B NOT DETECTED NOT DETECTED Final   Parainfluenza Virus 1 NOT DETECTED NOT DETECTED Final   Parainfluenza Virus 2 NOT DETECTED NOT DETECTED Final   Parainfluenza Virus 3 NOT DETECTED NOT DETECTED Final   Parainfluenza Virus 4 NOT DETECTED NOT DETECTED Final   Respiratory Syncytial Virus NOT DETECTED NOT DETECTED Final   Bordetella pertussis NOT DETECTED NOT DETECTED Final   Bordetella Parapertussis NOT DETECTED NOT DETECTED Final    Chlamydophila pneumoniae NOT DETECTED NOT DETECTED Final   Mycoplasma pneumoniae NOT DETECTED NOT DETECTED Final    Comment: Performed at [facility], [address] [place], [place] [address]  MRSA Next Gen by PCR, Nasal     Status: None   Collection Time: 03/21/24 11:25 AM   Specimen: Nasal Mucosa; Nasal Swab  Result Value Ref Range Status   MRSA by PCR Next Gen NOT DETECTED NOT DETECTED Final    Comment: (NOTE) The GeneXpert MRSA Assay (FDA approved for NASAL specimens only), is one component of a comprehensive MRSA colonization surveillance program. It is not intended to diagnose MRSA infection nor to guide or monitor treatment for MRSA infections. Test performance is not FDA approved in patients less than 13 years old. Performed at [facility], [address] [place], [place] [address]      Radiology Studies: No results found.  Scheduled Meds:  enoxaparin  (LOVENOX ) injection  40 mg Subcutaneous Q24H   metoCLOPramide  (REGLAN ) injection  10 mg Intravenous Q8H   Continuous Infusions:  cefTRIAXone  (ROCEPHIN )  IV Stopped (03/21/24 1647)   vancomycin  1,000 mg (03/22/24 1109)     LOS: 3 days   Norval Bar, MD  [facility]  03/22/2024, 12:48 PM

## 2024-03-22 NOTE — Progress Notes (Signed)
°   03/22/24 0301  Assess: MEWS Score  Temp 99.8 F (37.7 C)  Pulse Rate (!) 134  ECG Heart Rate (!) 137  Resp 20  SpO2 98 %  Assess: MEWS Score  MEWS Temp 0  MEWS Systolic 0  MEWS Pulse 3  MEWS RR 0  MEWS LOC 0  MEWS Score 3  MEWS Score Color Yellow  Assess: if the MEWS score is Yellow or Red  Were vital signs accurate and taken at a resting state? Yes  MEWS guidelines implemented  No, previously yellow, continue vital signs every 4 hours  Notify: Charge Nurse/RN  Name of Charge Nurse/RN Notified Arland  Assess: SIRS CRITERIA  SIRS Temperature  0  SIRS Respirations  0  SIRS Pulse 1  SIRS WBC 0  SIRS Score Sum  1

## 2024-03-22 NOTE — Plan of Care (Signed)

## 2024-03-22 NOTE — Progress Notes (Signed)
°    [facility]    Date of Admission:  03/18/2024   Total days of antibiotics: 4 vanco/ceftriaxone            ID: Helen White is a 28 y.o. female with  Principal Problem:   Pyelonephritis    Subjective: No headache No animal exposures No muscle or joint aches Has small kids at home, married Just out of shower, exhausted married  Medications:   enoxaparin  (LOVENOX ) injection  40 mg Subcutaneous Q24H   metoCLOPramide  (REGLAN ) injection  10 mg Intravenous Q8H    Objective: Vital signs in last 24 hours: Temp:  [98 F (36.7 C)-101.3 F (38.5 C)] 98 F (36.7 C) (12/13 0727) Pulse Rate:  [107-145] 126 (12/13 0727) Resp:  [18-39] 23 (12/13 0727) BP: (100-114)/(54-68) 101/60 (12/13 0727) SpO2:  [91 %-99 %] 96 % (12/13 0727)   General appearance: alert, cooperative, and no distress Eyes: negative findings: conjunctivae and sclerae normal and pupils equal, round, reactive to light and accomodation Throat: lips, mucosa, and tongue normal; teeth and gums normal Neck: no adenopathy and supple, symmetrical, trachea midline Resp: clear to auscultation bilaterally Cardio: regular rate and rhythm GI: normal findings: bowel sounds normal and soft, non-tender Extremities: edema none No rashes, no joint aches/tenderness.   Lab Results Recent Labs    03/20/24 0425 03/20/24 0954 03/20/24 2157  WBC 4.5  --  4.3  HGB 11.9*  --  11.4*  HCT 35.5*  --  34.0*  NA  --  136  --   K  --  3.6  --   CL  --  106  --   CO2  --  22  --   BUN  --  <5*  --   CREATININE  --  0.57  --    Liver Panel No results for input(s): PROT, ALBUMIN, AST, ALT, ALKPHOS, BILITOT, BILIDIR, IBILI in the last 72 hours. Sedimentation Rate Recent Labs    03/20/24 0425  ESRSEDRATE 5   C-Reactive Protein Recent Labs    03/20/24 0425  CRP 5.9*    Microbiology:  Studies/Results: No results found.   Assessment/Plan: FUO  Her Cx are negative With  her exposure to small children, will check her mom syndrome labs (EBV, CMV, Toxo) Will check her RPR as well Check ACE level Check Lyme (unlikely) and ehrlichia[place] to think about stopping anbx  [facility]/Infectious Disease Pager: [phone]  03/22/2024, 10:51 AM

## 2024-03-22 NOTE — Progress Notes (Signed)
 At 1619, pt spiked a temp of 102.1 MD, Tariq, notified and PRN tylenol  administered. HR in 130's. MD ordered LR 1000cc bolus. Temp went up to 103 at 1643, and then down to 99.4 at 1750. CCMD called RN at 1810 to notify that HR bumped up into the 150's-160's. MD notified via chat.

## 2024-03-22 NOTE — Progress Notes (Signed)
°   03/22/24 0431  Assess: MEWS Score  Temp (!) 101.3 F (38.5 C)  BP (!) 110/54  MAP (mmHg) 67  Pulse Rate (!) 145  ECG Heart Rate (!) 145  Resp (!) 39  SpO2 91 %  O2 Device Room Air  Assess: MEWS Score  MEWS Temp 1  MEWS Systolic 0  MEWS Pulse 3  MEWS RR 3  MEWS LOC 0  MEWS Score 7  MEWS Score Color Red  Assess: if the MEWS score is Yellow or Red  Were vital signs accurate and taken at a resting state? Yes  Does the patient meet 2 or more of the SIRS criteria? Yes  MEWS guidelines implemented  Yes, red  Treat  MEWS Interventions Considered administering scheduled or prn medications/treatments as ordered  Take Vital Signs  Increase Vital Sign Frequency  Red: Q1hr x2, continue Q4hrs until patient remains green for 12hrs  Escalate  MEWS: Escalate Red: Discuss with charge nurse and notify provider. Consider notifying RRT. If remains red for 2 hours consider need for higher level of care  Notify: Charge Nurse/RN  Name of Charge Nurse/RN Notified Arland  Assess: SIRS CRITERIA  SIRS Temperature  1  SIRS Respirations  1  SIRS Pulse 1  SIRS WBC 0  SIRS Score Sum  3

## 2024-03-22 NOTE — Progress Notes (Signed)
°   03/22/24 0456  Assess: MEWS Score  Pulse Rate (!) 128  ECG Heart Rate (!) 127  Resp (!) 25  SpO2 95 %  Assess: MEWS Score  MEWS Temp 1  MEWS Systolic 0  MEWS Pulse 2  MEWS RR 1  MEWS LOC 0  MEWS Score 4  MEWS Score Color Red  Assess: if the MEWS score is Yellow or Red  Were vital signs accurate and taken at a resting state? Yes  Does the patient meet 2 or more of the SIRS criteria? Yes  MEWS guidelines implemented  Yes, red  Treat  MEWS Interventions Considered administering scheduled or prn medications/treatments as ordered  Take Vital Signs  Increase Vital Sign Frequency  Red: Q1hr x2, continue Q4hrs until patient remains green for 12hrs  Escalate  MEWS: Escalate Red: Discuss with charge nurse and notify provider. Consider notifying RRT. If remains red for 2 hours consider need for higher level of care  Notify: Charge Nurse/RN  Name of Charge Nurse/RN Notified Arland  Assess: SIRS CRITERIA  SIRS Temperature  1  SIRS Respirations  1  SIRS Pulse 1  SIRS WBC 0  SIRS Score Sum  3

## 2024-03-22 NOTE — Progress Notes (Signed)
°   03/22/24 0522  Assess: MEWS Score  Temp 99.3 F (37.4 C)  BP 102/62  MAP (mmHg) 72  Pulse Rate (!) 130  ECG Heart Rate (!) 127  Resp (!) 22  Level of Consciousness Alert  SpO2 96 %  O2 Device Room Air  Assess: MEWS Score  MEWS Temp 0  MEWS Systolic 0  MEWS Pulse 2  MEWS RR 1  MEWS LOC 0  MEWS Score 3  MEWS Score Color Yellow  Assess: if the MEWS score is Yellow or Red  Were vital signs accurate and taken at a resting state? Yes  MEWS guidelines implemented  No, previously yellow, continue vital signs every 4 hours  Notify: Charge Nurse/RN  Name of Charge Nurse/RN Notified Arland  Assess: SIRS CRITERIA  SIRS Temperature  0  SIRS Respirations  1  SIRS Pulse 1  SIRS WBC 0  SIRS Score Sum  2

## 2024-03-22 NOTE — Progress Notes (Signed)
°   03/22/24 0432  Assess: MEWS Score  BP (!) 110/54  MAP (mmHg) 67  Pulse Rate (!) 144  ECG Heart Rate (!) 141  Resp (!) 25  SpO2 91 %  Assess: MEWS Score  MEWS Temp 1  MEWS Systolic 0  MEWS Pulse 3  MEWS RR 1  MEWS LOC 0  MEWS Score 5  MEWS Score Color Red  Assess: if the MEWS score is Yellow or Red  Were vital signs accurate and taken at a resting state? Yes  Does the patient meet 2 or more of the SIRS criteria? Yes  MEWS guidelines implemented  Yes, red  Treat  MEWS Interventions Considered administering scheduled or prn medications/treatments as ordered  Take Vital Signs  Increase Vital Sign Frequency  Red: Q1hr x2, continue Q4hrs until patient remains green for 12hrs  Escalate  MEWS: Escalate Red: Discuss with charge nurse and notify provider. Consider notifying RRT. If remains red for 2 hours consider need for higher level of care  Notify: Charge Nurse/RN  Name of Charge Nurse/RN Notified Arland  Assess: SIRS CRITERIA  SIRS Temperature  1  SIRS Respirations  1  SIRS Pulse 1  SIRS WBC 0  SIRS Score Sum  3

## 2024-03-23 LAB — CBC WITH DIFFERENTIAL/PLATELET
Basophils Absolute: 0.1 K/uL (ref 0.0–0.1)
Basophils Relative: 2 %
Eosinophils Absolute: 0 K/uL (ref 0.0–0.5)
Eosinophils Relative: 1 %
HCT: 34 % — ABNORMAL LOW (ref 36.0–46.0)
Hemoglobin: 11.5 g/dL — ABNORMAL LOW (ref 12.0–15.0)
Lymphocytes Relative: 22 %
Lymphs Abs: 1.1 K/uL (ref 0.7–4.0)
MCH: 28.5 pg (ref 26.0–34.0)
MCHC: 33.8 g/dL (ref 30.0–36.0)
MCV: 84.2 fL (ref 80.0–100.0)
Monocytes Absolute: 0.1 K/uL (ref 0.1–1.0)
Monocytes Relative: 2 %
Neutro Abs: 3.5 K/uL (ref 1.7–7.7)
Neutrophils Relative %: 73 %
Platelets: 169 K/uL (ref 150–400)
RBC: 4.04 MIL/uL (ref 3.87–5.11)
RDW: 13.9 % (ref 11.5–15.5)
WBC: 4.8 K/uL (ref 4.0–10.5)
nRBC: 0 % (ref 0.0–0.2)

## 2024-03-23 LAB — BASIC METABOLIC PANEL WITH GFR
Anion gap: 13 (ref 5–15)
BUN: <5 mg/dL — ABNORMAL LOW (ref 6–20)
CO2: 18 mmol/L — ABNORMAL LOW (ref 22–32)
Calcium: 7.5 mg/dL — ABNORMAL LOW (ref 8.9–10.3)
Chloride: 106 mmol/L (ref 98–111)
Creatinine, Ser: 0.54 mg/dL (ref 0.44–1.00)
GFR, Estimated: >60 mL/min (ref >60)
Glucose, Bld: 91 mg/dL (ref 70–99)
Potassium: 3.2 mmol/L — ABNORMAL LOW (ref 3.5–5.1)
Sodium: 137 mmol/L (ref 135–145)

## 2024-03-23 LAB — CULTURE, BLOOD (SINGLE)
Culture: NO GROWTH
Special Requests: ADEQUATE

## 2024-03-23 LAB — CULTURE, BLOOD (ROUTINE X 2): Culture: NO GROWTH

## 2024-03-23 LAB — MAGNESIUM: Magnesium: 1.5 mg/dL — ABNORMAL LOW (ref 1.7–2.4)

## 2024-03-23 LAB — TSH: TSH: 1.855 u[IU]/mL (ref 0.350–4.500)

## 2024-03-23 LAB — PHOSPHORUS: Phosphorus: 2.7 mg/dL (ref 2.5–4.6)

## 2024-03-23 LAB — SYPHILIS: RPR W/REFLEX TO RPR TITER AND TREPONEMAL ANTIBODIES, TRADITIONAL SCREENING AND DIAGNOSIS ALGORITHM: RPR Ser Ql: NONREACTIVE

## 2024-03-23 MED ORDER — MAGNESIUM SULFATE IN D5W 1-5 GM/100ML-% IV SOLN
1.0000 g | Freq: Once | INTRAVENOUS | Status: AC
  Administered 2024-03-23: 1 g via INTRAVENOUS
  Filled 2024-03-23: qty 100

## 2024-03-23 MED ORDER — MAGNESIUM SULFATE 50 % IJ SOLN: 1.0000 g | Freq: Once | INTRAMUSCULAR | Status: DC

## 2024-03-23 MED ORDER — PANTOPRAZOLE SODIUM 40 MG PO TBEC
40.0000 mg | DELAYED_RELEASE_TABLET | Freq: Every day | ORAL | Status: DC
  Administered 2024-03-23 – 2024-03-25 (×3): 40 mg via ORAL
  Filled 2024-03-23 (×3): qty 1

## 2024-03-23 MED ORDER — SODIUM CHLORIDE 0.9 % IV BOLUS
1000.0000 mL | Freq: Once | INTRAVENOUS | Status: AC
  Administered 2024-03-23: 1000 mL via INTRAVENOUS

## 2024-03-23 MED ORDER — LACTATED RINGERS IV BOLUS: 1000.0000 mL | Freq: Once | INTRAVENOUS | Status: DC

## 2024-03-23 MED ORDER — POTASSIUM CHLORIDE CRYS ER 20 MEQ PO TBCR
40.0000 meq | EXTENDED_RELEASE_TABLET | Freq: Once | ORAL | Status: AC
  Administered 2024-03-23: 40 meq via ORAL
  Filled 2024-03-23: qty 2

## 2024-03-23 MED ORDER — IBUPROFEN 200 MG PO TABS: 400.0000 mg | ORAL_TABLET | Freq: Once | ORAL | Status: DC | PRN

## 2024-03-23 MED ORDER — SODIUM CHLORIDE 0.9 % IV SOLN: INTRAVENOUS | Status: AC

## 2024-03-23 MED ORDER — POTASSIUM CHLORIDE 10 MEQ/100ML IV SOLN
10.0000 meq | INTRAVENOUS | Status: AC
  Administered 2024-03-23 (×2): 10 meq via INTRAVENOUS
  Filled 2024-03-23 (×2): qty 100

## 2024-03-23 MED ORDER — MIRTAZAPINE 15 MG PO TABS
7.5000 mg | ORAL_TABLET | Freq: Every day | ORAL | Status: DC
  Administered 2024-03-23 – 2024-03-24 (×2): 7.5 mg via ORAL
  Filled 2024-03-23 (×3): qty 1

## 2024-03-23 NOTE — Progress Notes (Signed)
 PROGRESS NOTE    Helen White  FMW:[id number] DOB: 1995/12/04 DOA: 03/18/2024 PCP: Patient, No Pcp Per  Subjective: No acute events overnight. Seen and examined at bedside with aunt present. Waiting to try ordered breakfast from outside this morning.    Hospital Course: 28 y.o. female with without past medical history, not taking any prescription medications on a regular basis comes into the hospital with generalized weakness, fevers, right flank pain.  Patient has been having high fevers for the past 9 days, coming and going.  She would have couple days with fevers and feeling very weak, and then a day or 2 of feeling fairly normal and then symptoms returning.  She presented to the emergency room here on 12/5 with similar symptoms, she was diagnosed with atypical pneumonia based on a chest x-ray, also UTI given positive urinalysis, she was prescribed azithromycin  and cefpodoxime  and sent home.  She took the antibiotics for couple of days, however her symptoms persisted and newly developed right flank pain with persistent fevers so decided come back to the emergency room.  She denies any vomiting but did report some intermittent nausea.  She complains of a cough but mainly when sitting down, no sputum production.  She denies any dysuria.    Assessment and Plan:  Fever of unknown course - concern for likely viral illness - no recent travel, sick contacts, medication changes - continues to have intermittent fevers - covid/flu/RSV negative - HIV screen negative - CRP 5.9, ESR 5 - monospot test negative - respiratory viral panel negative - MRSA screen negative - RPR not reactive - CXR unremarkable - UA unremarkable, however, has been on antibiotics recently - ANA negative, RF negative, ferritin not elevated - CT C/A/P on 12/9 unremarkable - repeat Bcx 12/11 x2 with NGTD, final read pending - status post sepsis fluids in ED/admission - stop all antibiotics now - cont IV fluids  until PO intake consistently good - will check serology for EBV, CMV, toxoplasmosis, ehrlichia - ACE level pending - ID following    Poor oral intake/Hypotension - etiology unclear, likely in the setting of sepsis and poor oral intake - status post sepsis fluids as elsewhere - encourage oral intake/hydration - cont reglan  q8h - cont zofran  PRN - start empiric pantoprazole  - keep on continuous fluids until PO intake improves - start mirtazapine  to stimulate appetite - consider GI consult if oral intake remains poor   Sinus tachycardia - EKG 12/11 confirmed - continue IV fluids as elsewhere for sepsis picture until PO intake consistently good - monitor clinically  DVT prophylaxis: Place and maintain sequential compression device Start: 03/21/24 2121 enoxaparin  (LOVENOX ) injection 40 mg Start: 03/18/24 2000  Lovenox    Code Status: Full Code Family Communication: updated aunt at bedside Disposition Plan: home  Reason for continuing need for hospitalization: IV fluids, poor oral intake, workup for recurrent fevers, ID recommendations  Objective: Vitals:   03/23/24 0407 03/23/24 0807 03/23/24 0945 03/23/24 1130  BP: 104/62 110/65 110/65 98/61  Pulse: (!) 111 (!) 144 (!) 127 (!) 125  Resp: 20 (!) 22 20 (!) 23  Temp: 98.5 F (36.9 C) (!) 101.5 F (38.6 C) 99.7 F (37.6 C) 98.1 F (36.7 C)  TempSrc: Oral Oral  Oral  SpO2: 97% 96% 95% 97%  Weight:      Height:        Intake/Output Summary (Last 24 hours) at 03/23/2024 1214 Last data filed at 03/23/2024 0940 Gross per 24 hour  Intake 1411.74 ml  Output --  Net 1411.74 ml   Filed Weights   03/18/24 1822  Weight: 93.4 kg    Examination:  Physical Exam Vitals and nursing note reviewed.  Constitutional:      General: She is not in acute distress.    Appearance: She is ill-appearing.  HENT:     Head: Normocephalic and atraumatic.  Cardiovascular:     Rate and Rhythm: Normal rate and regular rhythm.     Pulses:  Normal pulses.     Heart sounds: Normal heart sounds.  Pulmonary:     Effort: Pulmonary effort is normal.     Breath sounds: Normal breath sounds.  Abdominal:     General: Bowel sounds are normal.     Palpations: Abdomen is soft.  Neurological:     Mental Status: She is alert.     Data Reviewed: I have personally reviewed following labs and imaging studies  CBC: Recent Labs  Lab 03/18/24 1054 03/19/24 0449 03/20/24 0425 03/20/24 2157 03/23/24 0326  WBC 5.6 4.0 4.5 4.3 4.8  NEUTROABS  --   --  2.4  --  3.5  HGB 14.3 11.7* 11.9* 11.4* 11.5*  HCT 42.7 34.3* 35.5* 34.0* 34.0*  MCV 85.1 84.9 83.5 84.4 84.2  PLT 230 197 202 183 169   Basic Metabolic Panel: Recent Labs  Lab 03/18/24 1054 03/19/24 0449 03/20/24 0954 03/23/24 0326  NA 139 137 136 137  K 4.5 3.3* 3.6 3.2*  CL 104 108 106 106  CO2 26 26 22  18*  GLUCOSE 96 93 106* 91  BUN 5* <5* <5* <5*  CREATININE 0.80 0.61 0.57 0.54  CALCIUM 9.0 7.6* 7.5* 7.5*  MG  --   --   --  1.5*  PHOS  --   --   --  2.7   GFR: Estimated Creatinine Clearance: 115 mL/min (by C-G formula based on SCr of 0.54 mg/dL). Liver Function Tests: Recent Labs  Lab 03/18/24 1054 03/19/24 0449  AST 43* 40  ALT 57* 45*  ALKPHOS 73 55  BILITOT 1.0 0.7  PROT 7.3 5.4*  ALBUMIN 3.1* 2.2*   Recent Labs  Lab 03/18/24 1054  LIPASE 25   No results for input(s): AMMONIA in the last 168 hours. Coagulation Profile: No results for input(s): INR, PROTIME in the last 168 hours. Cardiac Enzymes: No results for input(s): CKTOTAL, CKMB, CKMBINDEX, TROPONINI in the last 168 hours. ProBNP, BNP (last 5 results) No results for input(s): PROBNP, BNP in the last 8760 hours. HbA1C: No results for input(s): HGBA1C in the last 72 hours. CBG: No results for input(s): GLUCAP in the last 168 hours. Lipid Profile: No results for input(s): CHOL, HDL, LDLCALC, TRIG, CHOLHDL, LDLDIRECT in the last 72 hours. Thyroid Function  Tests: Recent Labs    03/23/24 0938  TSH 1.855   Anemia Panel: Recent Labs    03/22/24 0250  FERRITIN 209   Sepsis Labs: Recent Labs  Lab 03/19/24 1405 03/19/24 2128 03/20/24 2157  PROCALCITON  --  0.11  --   LATICACIDVEN 1.6  --  0.8    Recent Results (from the past 240 hours)  Resp panel by RT-PCR (RSV, Flu A&B, Covid) Anterior Nasal Swab     Status: None   Collection Time: 03/18/24 10:50 AM   Specimen: Anterior Nasal Swab  Result Value Ref Range Status   SARS Coronavirus 2 by RT PCR NEGATIVE NEGATIVE Final   Influenza A by PCR NEGATIVE NEGATIVE Final   Influenza B by PCR NEGATIVE  NEGATIVE Final    Comment: (NOTE) The Xpert Xpress SARS-CoV-2/FLU/RSV plus assay is intended as an aid in the diagnosis of influenza from Nasopharyngeal swab specimens and should not be used as a sole basis for treatment. Nasal washings and aspirates are unacceptable for Xpert Xpress SARS-CoV-2/FLU/RSV testing.  Fact Sheet for Patients: bloggercourse.com  Fact Sheet for Healthcare Providers: seriousbroker.it  This test is not yet approved or cleared by the [place]  FDA and has been authorized for detection and/or diagnosis of SARS-CoV-2 by FDA under an Emergency Use Authorization (EUA). This EUA will remain in effect (meaning this test can be used) for the duration of the COVID-19 declaration under Section 564(b)(1) of the Act, 21 U.S.C. section 360bbb-3(b)(1), unless the authorization is terminated or revoked.     Resp Syncytial Virus by PCR NEGATIVE NEGATIVE Final    Comment: (NOTE) Fact Sheet for Patients: bloggercourse.com  Fact Sheet for Healthcare Providers: seriousbroker.it  This test is not yet approved or cleared by the [place]  FDA and has been authorized for detection and/or diagnosis of SARS-CoV-2 by FDA under an Emergency Use Authorization (EUA). This EUA  will remain in effect (meaning this test can be used) for the duration of the COVID-19 declaration under Section 564(b)(1) of the Act, 21 U.S.C. section 360bbb-3(b)(1), unless the authorization is terminated or revoked.  Performed at [facility], [address], [place], [place] [address]   Blood culture (routine x 2)     Status: None   Collection Time: 03/18/24 11:58 AM   Specimen: BLOOD RIGHT FOREARM  Result Value Ref Range Status   Specimen Description BLOOD RIGHT FOREARM  Final   Special Requests   Final    BOTTLES DRAWN AEROBIC AND ANAEROBIC Blood Culture results may not be optimal due to an inadequate volume of blood received in culture bottles   Culture   Final    NO GROWTH 5 DAYS Performed at [facility], [address] [place], [place] [address]    Report Status 03/23/2024 FINAL  Final  Urine Culture     Status: Abnormal   Collection Time: 03/18/24 12:23 PM   Specimen: Urine, Clean Catch  Result Value Ref Range Status   Specimen Description URINE, CLEAN CATCH  Final   Special Requests   Final    NONE Performed at [facility], [address], [place], [place] [address]    Culture MULTIPLE SPECIES PRESENT, SUGGEST RECOLLECTION (A)  Final   Report Status 03/19/2024 FINAL  Final  Culture, blood (single)     Status: None   Collection Time: 03/18/24  5:30 PM   Specimen: BLOOD LEFT ARM  Result Value Ref Range Status   Specimen Description BLOOD LEFT ARM  Final   Special Requests   Final    BOTTLES DRAWN AEROBIC AND ANAEROBIC Blood Culture adequate volume   Culture   Final    NO GROWTH 5 DAYS Performed at [facility], [address], [place], [place] [address]    Report Status 03/23/2024 FINAL  Final  Culture, blood (Routine X 2) w Reflex to ID Panel     Status: None (Preliminary result)   Collection Time: 03/20/24  2:03 PM   Specimen: BLOOD  Result Value Ref Range Status   Specimen Description BLOOD SITE NOT SPECIFIED  Final   Special  Requests   Final    BOTTLES DRAWN AEROBIC AND ANAEROBIC Blood Culture adequate volume   Culture   Final  NO GROWTH 3 DAYS Performed at [facility], [address] [place], [place] [address]    Report Status PENDING  Incomplete  Culture, blood (Routine X 2) w Reflex to ID Panel     Status: None (Preliminary result)   Collection Time: 03/20/24  2:03 PM   Specimen: BLOOD  Result Value Ref Range Status   Specimen Description BLOOD SITE NOT SPECIFIED  Final   Special Requests   Final    BOTTLES DRAWN AEROBIC AND ANAEROBIC Blood Culture adequate volume   Culture   Final    NO GROWTH 3 DAYS Performed at [facility], [address], [place], [place] [address]    Report Status PENDING  Incomplete  Respiratory (~20 pathogens) panel by PCR     Status: None   Collection Time: 03/21/24  9:48 AM   Specimen: Nasopharyngeal Swab; Respiratory  Result Value Ref Range Status   Adenovirus NOT DETECTED NOT DETECTED Final   Coronavirus 229E NOT DETECTED NOT DETECTED Final    Comment: (NOTE) The Coronavirus on the Respiratory Panel, DOES NOT test for the novel  Coronavirus (2019 nCoV)    Coronavirus HKU1 NOT DETECTED NOT DETECTED Final   Coronavirus NL63 NOT DETECTED NOT DETECTED Final   Coronavirus OC43 NOT DETECTED NOT DETECTED Final   Metapneumovirus NOT DETECTED NOT DETECTED Final   Rhinovirus / Enterovirus NOT DETECTED NOT DETECTED Final   Influenza A NOT DETECTED NOT DETECTED Final   Influenza B NOT DETECTED NOT DETECTED Final   Parainfluenza Virus 1 NOT DETECTED NOT DETECTED Final   Parainfluenza Virus 2 NOT DETECTED NOT DETECTED Final   Parainfluenza Virus 3 NOT DETECTED NOT DETECTED Final   Parainfluenza Virus 4 NOT DETECTED NOT DETECTED Final   Respiratory Syncytial Virus NOT DETECTED NOT DETECTED Final   Bordetella pertussis NOT DETECTED NOT DETECTED Final   Bordetella Parapertussis NOT DETECTED NOT DETECTED Final   Chlamydophila pneumoniae NOT DETECTED NOT DETECTED  Final   Mycoplasma pneumoniae NOT DETECTED NOT DETECTED Final    Comment: Performed at [facility], [address], [place], [place] [address]  MRSA Next Gen by PCR, Nasal     Status: None   Collection Time: 03/21/24 11:25 AM   Specimen: Nasal Mucosa; Nasal Swab  Result Value Ref Range Status   MRSA by PCR Next Gen NOT DETECTED NOT DETECTED Final    Comment: (NOTE) The GeneXpert MRSA Assay (FDA approved for NASAL specimens only), is one component of a comprehensive MRSA colonization surveillance program. It is not intended to diagnose MRSA infection nor to guide or monitor treatment for MRSA infections. Test performance is not FDA approved in patients less than 45 years old. Performed at [facility], [address] [place], [place] [address]      Radiology Studies: No results found.  Scheduled Meds:  enoxaparin  (LOVENOX ) injection  40 mg Subcutaneous Q24H   magnesium  sulfate  1 g Intravenous Once   metoCLOPramide  (REGLAN ) injection  10 mg Intravenous Q8H   Continuous Infusions:  lactated ringers  125 mL/hr at 03/22/24 1850   potassium chloride  10 mEq (03/23/24 1139)     LOS: 4 days   Norval Bar, MD  [facility]  03/23/2024, 12:14 PM

## 2024-03-23 NOTE — Plan of Care (Signed)

## 2024-03-23 NOTE — Progress Notes (Signed)
 Patient asked for her Reglan , but her IV acces is not working. She requested IV team because they use the US  machine. IV team notified. We'll continue to monitor.  Her IV fluid that was running is stopped for now.

## 2024-03-23 NOTE — Progress Notes (Signed)
 Patient asked to stop the LR because it makes her have nausea.

## 2024-03-24 ENCOUNTER — Other Ambulatory Visit (HOSPITAL_COMMUNITY): Payer: Self-pay

## 2024-03-24 DIAGNOSIS — R509 Fever, unspecified: Secondary | ICD-10-CM

## 2024-03-24 DIAGNOSIS — R110 Nausea: Secondary | ICD-10-CM

## 2024-03-24 DIAGNOSIS — R630 Anorexia: Secondary | ICD-10-CM

## 2024-03-24 LAB — BASIC METABOLIC PANEL WITH GFR
Anion gap: 9 (ref 5–15)
BUN: <5 mg/dL — ABNORMAL LOW (ref 6–20)
CO2: 21 mmol/L — ABNORMAL LOW (ref 22–32)
Calcium: 7.4 mg/dL — ABNORMAL LOW (ref 8.9–10.3)
Chloride: 112 mmol/L — ABNORMAL HIGH (ref 98–111)
Creatinine, Ser: 0.6 mg/dL (ref 0.44–1.00)
GFR, Estimated: >60 mL/min (ref >60)
Glucose, Bld: 93 mg/dL (ref 70–99)
Potassium: 3.8 mmol/L (ref 3.5–5.1)
Sodium: 142 mmol/L (ref 135–145)

## 2024-03-24 LAB — MAGNESIUM: Magnesium: 1.8 mg/dL (ref 1.7–2.4)

## 2024-03-24 LAB — TOXOPLASMA ANTIBODIES- IGG AND  IGM
Toxoplasma Antibody- IgM: <3 [AU]/ml (ref 0.0–7.9)
Toxoplasma IgG Ratio: <3 [IU]/mL (ref 0.0–7.1)

## 2024-03-24 LAB — ANCA PROFILE
Anti-MPO Antibodies: <0.2 U (ref 0.0–0.9)
Anti-PR3 Antibodies: <0.2 U (ref 0.0–0.9)
Atypical P-ANCA titer: <1:20 {titer}
C-ANCA: <1:20 {titer}
P-ANCA: <1:20 {titer}

## 2024-03-24 LAB — LYME IGG/IGM
Lyme IgG EIA: NEGATIVE
Lyme IgM EIA: POSITIVE
Lyme Interpretation: DETECTED — AB

## 2024-03-24 LAB — EPSTEIN-BARR VIRUS VCA, IGG: EBV VCA IgG: 394 U/mL — ABNORMAL HIGH (ref 0.0–17.9)

## 2024-03-24 LAB — ANGIOTENSIN CONVERTING ENZYME: Angiotensin-Converting Enzyme: 70 U/L (ref 14–82)

## 2024-03-24 LAB — EPSTEIN-BARR VIRUS VCA, IGM: EBV VCA IgM: 50.2 U/mL — ABNORMAL HIGH (ref 0.0–35.9)

## 2024-03-24 LAB — LYME DISEASE SEROLOGY W/REFLEX: Lyme Total Antibody EIA: UNDETERMINED

## 2024-03-24 LAB — CMV IGM: CMV IgM: 238 [AU]/ml — ABNORMAL HIGH (ref 0.0–29.9)

## 2024-03-24 MED ORDER — SODIUM CHLORIDE 0.9 % IV BOLUS
1000.0000 mL | Freq: Once | INTRAVENOUS | Status: AC
  Administered 2024-03-24: 14:00:00 1000 mL via INTRAVENOUS

## 2024-03-24 MED ORDER — MAGNESIUM SULFATE IN D5W 1-5 GM/100ML-% IV SOLN
1.0000 g | Freq: Once | INTRAVENOUS | Status: AC
  Administered 2024-03-24: 06:00:00 1 g via INTRAVENOUS
  Filled 2024-03-24: qty 100

## 2024-03-24 NOTE — Evaluation (Signed)
 Clinical/Bedside Swallow Evaluation Patient Details  Name: Helen White MRN: [id number] Date of Birth: [birth year]  Today's Date: 03/24/2024 Time: SLP Start Time (ACUTE ONLY): 1619 SLP Stop Time (ACUTE ONLY): 1629 SLP Time Calculation (min) (ACUTE ONLY): 10 min  Past Medical History:  Past Medical History:  Diagnosis Date   Anemia    GERD (gastroesophageal reflux disease)    Migraine    Past Surgical History:  Past Surgical History:  Procedure Laterality Date   NO PAST SURGERIES     HPI:  28 yo female presenting to ED 12/9 with intermittent high fevers. Seen in ED 12/5, diagnosed with atypical pneumonia and UTI, discharged with abx. Symptoms persisted with acute R flank pain and was admitted 12/9. CT Chest 12/9 shows no acute findings or explanation of pt's symptoms with mildly increased subsegmental atelectasis at both lung bases. Intake has been decreased, especially when febrile. PMH includes GERD, migraines, anemia    Assessment / Plan / Recommendation  Clinical Impression  Pt reports her appetite has fluctuated throughout this admissions and is typically dependent on fevers but otherwise denies concerns related to swallowing. She presents with no s/s of dysphagia or aspiration. Continue current diet without ongoing SLP f/u. SLP Visit Diagnosis: Dysphagia, unspecified (R13.10)    Aspiration Risk  No limitations    Diet Recommendation           Other Recommendations Oral Care Recommendations: Oral care BID     Swallow Evaluation Recommendations Recommendations: PO diet PO Diet Recommendation: Regular;Thin liquids (Level 0) Liquid Administration via: Cup;Straw Medication Administration: Whole meds with liquid Supervision: Patient able to self-feed Swallowing strategies  : Slow rate;Small bites/sips Postural changes: Position pt fully upright for meals;Stay upright 30-60 min after meals Oral care recommendations: Oral care BID (2x/day)   Assistance Recommended at  Discharge    Functional Status Assessment Patient has not had a recent decline in their functional status  Frequency and Duration            Prognosis Prognosis for improved oropharyngeal function: Good Barriers to Reach Goals: Time post onset      Swallow Study   General HPI: 28 yo female presenting to ED 12/9 with intermittent high fevers. Seen in ED 12/5, diagnosed with atypical pneumonia and UTI, discharged with abx. Symptoms persisted with acute R flank pain and was admitted 12/9. CT Chest 12/9 shows no acute findings or explanation of pt's symptoms with mildly increased subsegmental atelectasis at both lung bases. Intake has been decreased, especially when febrile. PMH includes GERD, migraines, anemia Type of Study: Bedside Swallow Evaluation Previous Swallow Assessment: none in chart Diet Prior to this Study: Regular;Thin liquids (Level 0) Temperature Spikes Noted: No Respiratory Status: Room air History of Recent Intubation: No Behavior/Cognition: Alert;Cooperative;Pleasant mood Oral Cavity Assessment: Within Functional Limits Oral Care Completed by SLP: No Oral Cavity - Dentition: Adequate natural dentition Vision: Functional for self-feeding Self-Feeding Abilities: Able to feed self Patient Positioning: Upright in bed Baseline Vocal Quality: Normal Volitional Cough: Strong Volitional Swallow: Able to elicit    Oral/Motor/Sensory Function Overall Oral Motor/Sensory Function: Within functional limits   Ice Chips Ice chips: Not tested   Thin Liquid Thin Liquid: Within functional limits Presentation: Straw;Self Fed    Nectar Thick Nectar Thick Liquid: Not tested   Honey Thick Honey Thick Liquid: Not tested   Puree Puree: Not tested   Solid     Solid: Within functional limits Presentation: Self Fed      Damien Blumenthal, M.A., CCC-SLP  Speech Language Pathology, Acute Rehabilitation Services  Secure Chat preferred [phone]  03/24/2024,4:44 PM

## 2024-03-24 NOTE — Progress Notes (Signed)
 [facility] night cross cover note:   I was notified by the patient's RN that the patient has continued to spike intermittent fevers, with most recent temperature noted to be 102.6.  Corresponding heart rates in the 130s to 140s, associate with sinus tachycardia.  I have confirmed with the patient's RN, that the patient is not experiencing new symptoms at this time.  Additional vital signs appear stable, including systolic blood pressures in the 1 teens, respiratory rate 19, and oxygen saturation 98% on room air.  She has received prn p.o. acetaminophen  in response to the most recent fever.  Will trend ensuing temperature as well as heart rate in response to this dose of Tylenol .  Additionally, magnesium  level found to be 1.8 this morning, up from 1.5 when checked yesterday.  I have ordered magnesium  sulfate 1 g IV over 1 hour.     Eva Pore, DO Hospitalist

## 2024-03-24 NOTE — Progress Notes (Signed)
 PROGRESS NOTE    Helen White  FMW:[id number] DOB: Jan 26, 1996 DOA: 03/18/2024 PCP: Patient, No Pcp Per  Subjective: Noted to have recurrent fever with tachycardia overnight. Seen and examined at bedside with uncle present. Reports she was able to tolerate food from [employer] yesterday for lunch. Reports she is going to try to eat and drink more this morning. Denies vomiting or constipation.   Hospital Course: 28 y.o. female with without past medical history, not taking any prescription medications on a regular basis comes into the hospital with generalized weakness, fevers, right flank pain.  Patient has been having high fevers for the past 9 days, coming and going.  She would have couple days with fevers and feeling very weak, and then a day or 2 of feeling fairly normal and then symptoms returning.  She presented to the emergency room here on 12/5 with similar symptoms, she was diagnosed with atypical pneumonia based on a chest x-ray, also UTI given positive urinalysis, she was prescribed azithromycin  and cefpodoxime  and sent home.  She took the antibiotics for couple of days, however her symptoms persisted and newly developed right flank pain with persistent fevers so decided come back to the emergency room.  She denies any vomiting but did report some intermittent nausea.  She complains of a cough but mainly when sitting down, no sputum production.  She denies any dysuria.    Assessment and Plan:  Fever of unknown origin - concern for likely viral illness - no recent travel, sick contacts, medication changes - continues to have intermittent fevers - covid/flu/RSV negative - HIV screen negative - CRP 5.9, ESR 5 - monospot test negative - respiratory viral panel negative - MRSA screen negative - RPR not reactive - CXR unremarkable - UA unremarkable, however, has been on antibiotics recently - ANA negative, RF negative, ferritin not elevated - CT C/A/P on 12/9 unremarkable - repeat  Bcx 12/11 x2 with NGTD, final read pending - status post sepsis fluids in ED/admission - stopped all antibiotics now - requiring daily IV fluid boluses daily - cont IV fluids until PO intake consistently good - serology for EBV, CMV, toxoplasmosis, ehrlichia pending - ACE level pending - ID following    Poor oral intake/Hypotension - etiology unclear, likely in the setting of sepsis and poor oral intake - status post sepsis fluids as elsewhere - encourage oral intake/hydration - cont reglan  q8h - cont zofran  PRN - cont empiric pantoprazole  - keep on continuous fluids until PO intake improves - cont mirtazapine  to stimulate appetite - discussed with GI team, awaiting consult recs   Sinus tachycardia - EKG 12/11 confirmed - continue IV fluids as elsewhere for sepsis picture until PO intake consistently good - monitor clinically  Hypokalemia - likely in the setting of poor oral intake - monitor and replete electrolytes as needed  DVT prophylaxis: Place and maintain sequential compression device Start: 03/21/24 2121 enoxaparin  (LOVENOX ) injection 40 mg Start: 03/18/24 2000  Lovenox    Code Status: Full Code Family Communication: updated uncle at bedside Disposition Plan: home Reason for continuing need for hospitalization: severity of illness  Objective: Vitals:   03/23/24 2335 03/24/24 0333 03/24/24 0600 03/24/24 0817  BP: 100/63 118/66  97/61  Pulse: (!) 113 (!) 124  (!) 108  Resp: (!) 29 19  18   Temp: 98.3 F (36.8 C) (!) 102.6 F (39.2 C) 99 F (37.2 C) 98.5 F (36.9 C)  TempSrc: Oral Oral Oral Oral  SpO2: 97% 98%  96%  Weight:  89.2 kg   Height:        Intake/Output Summary (Last 24 hours) at 03/24/2024 1219 Last data filed at 03/24/2024 0500 Gross per 24 hour  Intake 2584.78 ml  Output --  Net 2584.78 ml   Filed Weights   03/18/24 1822 03/24/24 0600  Weight: 93.4 kg 89.2 kg    Examination:  Physical Exam Vitals and nursing note reviewed.   Constitutional:      General: She is not in acute distress.    Appearance: She is ill-appearing.  HENT:     Head: Normocephalic and atraumatic.  Cardiovascular:     Rate and Rhythm: Regular rhythm. Tachycardia present.     Pulses: Normal pulses.     Heart sounds: Normal heart sounds.  Pulmonary:     Effort: Pulmonary effort is normal.     Breath sounds: Normal breath sounds.  Abdominal:     General: Bowel sounds are normal.     Palpations: Abdomen is soft.  Musculoskeletal:     Right lower leg: No edema.     Left lower leg: No edema.  Neurological:     Mental Status: She is alert.     Data Reviewed: I have personally reviewed following labs and imaging studies  CBC: Recent Labs  Lab 03/18/24 1054 03/19/24 0449 03/20/24 0425 03/20/24 2157 03/23/24 0326  WBC 5.6 4.0 4.5 4.3 4.8  NEUTROABS  --   --  2.4  --  3.5  HGB 14.3 11.7* 11.9* 11.4* 11.5*  HCT 42.7 34.3* 35.5* 34.0* 34.0*  MCV 85.1 84.9 83.5 84.4 84.2  PLT 230 197 202 183 169   Basic Metabolic Panel: Recent Labs  Lab 03/18/24 1054 03/19/24 0449 03/20/24 0954 03/23/24 0326 03/24/24 0307  NA 139 137 136 137 142  K 4.5 3.3* 3.6 3.2* 3.8  CL 104 108 106 106 112*  CO2 26 26 22  18* 21*  GLUCOSE 96 93 106* 91 93  BUN 5* <5* <5* <5* <5*  CREATININE 0.80 0.61 0.57 0.54 0.60  CALCIUM 9.0 7.6* 7.5* 7.5* 7.4*  MG  --   --   --  1.5* 1.8  PHOS  --   --   --  2.7  --    GFR: Estimated Creatinine Clearance: 112.2 mL/min (by C-G formula based on SCr of 0.6 mg/dL). Liver Function Tests: Recent Labs  Lab 03/18/24 1054 03/19/24 0449  AST 43* 40  ALT 57* 45*  ALKPHOS 73 55  BILITOT 1.0 0.7  PROT 7.3 5.4*  ALBUMIN 3.1* 2.2*   Recent Labs  Lab 03/18/24 1054  LIPASE 25   No results for input(s): AMMONIA in the last 168 hours. Coagulation Profile: No results for input(s): INR, PROTIME in the last 168 hours. Cardiac Enzymes: No results for input(s): CKTOTAL, CKMB, CKMBINDEX, TROPONINI in  the last 168 hours. ProBNP, BNP (last 5 results) No results for input(s): PROBNP, BNP in the last 8760 hours. HbA1C: No results for input(s): HGBA1C in the last 72 hours. CBG: No results for input(s): GLUCAP in the last 168 hours. Lipid Profile: No results for input(s): CHOL, HDL, LDLCALC, TRIG, CHOLHDL, LDLDIRECT in the last 72 hours. Thyroid Function Tests: Recent Labs    03/23/24 0938  TSH 1.855   Anemia Panel: Recent Labs    03/22/24 0250  FERRITIN 209   Sepsis Labs: Recent Labs  Lab 03/19/24 1405 03/19/24 2128 03/20/24 2157  PROCALCITON  --  0.11  --   LATICACIDVEN 1.6  --  0.8  Recent Results (from the past 240 hours)  Resp panel by RT-PCR (RSV, Flu A&B, Covid) Anterior Nasal Swab     Status: None   Collection Time: 03/18/24 10:50 AM   Specimen: Anterior Nasal Swab  Result Value Ref Range Status   SARS Coronavirus 2 by RT PCR NEGATIVE NEGATIVE Final   Influenza A by PCR NEGATIVE NEGATIVE Final   Influenza B by PCR NEGATIVE NEGATIVE Final    Comment: (NOTE) The Xpert Xpress SARS-CoV-2/FLU/RSV plus assay is intended as an aid in the diagnosis of influenza from Nasopharyngeal swab specimens and should not be used as a sole basis for treatment. Nasal washings and aspirates are unacceptable for Xpert Xpress SARS-CoV-2/FLU/RSV testing.  Fact Sheet for Patients: bloggercourse.com  Fact Sheet for Healthcare Providers: seriousbroker.it  This test is not yet approved or cleared by the [place]  FDA and has been authorized for detection and/or diagnosis of SARS-CoV-2 by FDA under an Emergency Use Authorization (EUA). This EUA will remain in effect (meaning this test can be used) for the duration of the COVID-19 declaration under Section 564(b)(1) of the Act, 21 U.S.C. section 360bbb-3(b)(1), unless the authorization is terminated or revoked.     Resp Syncytial Virus by PCR NEGATIVE  NEGATIVE Final    Comment: (NOTE) Fact Sheet for Patients: bloggercourse.com  Fact Sheet for Healthcare Providers: seriousbroker.it  This test is not yet approved or cleared by the [place]  FDA and has been authorized for detection and/or diagnosis of SARS-CoV-2 by FDA under an Emergency Use Authorization (EUA). This EUA will remain in effect (meaning this test can be used) for the duration of the COVID-19 declaration under Section 564(b)(1) of the Act, 21 U.S.C. section 360bbb-3(b)(1), unless the authorization is terminated or revoked.  Performed at [facility], [address] [place], [place] [address]   Blood culture (routine x 2)     Status: None   Collection Time: 03/18/24 11:58 AM   Specimen: BLOOD RIGHT FOREARM  Result Value Ref Range Status   Specimen Description BLOOD RIGHT FOREARM  Final   Special Requests   Final    BOTTLES DRAWN AEROBIC AND ANAEROBIC Blood Culture results may not be optimal due to an inadequate volume of blood received in culture bottles   Culture   Final    NO GROWTH 5 DAYS Performed at [facility], [address], [place], [place] [address]    Report Status 03/23/2024 FINAL  Final  Urine Culture     Status: Abnormal   Collection Time: 03/18/24 12:23 PM   Specimen: Urine, Clean Catch  Result Value Ref Range Status   Specimen Description URINE, CLEAN CATCH  Final   Special Requests   Final    NONE Performed at [facility], [address] [place], [place] [address]    Culture MULTIPLE SPECIES PRESENT, SUGGEST RECOLLECTION (A)  Final   Report Status 03/19/2024 FINAL  Final  Culture, blood (single)     Status: None   Collection Time: 03/18/24  5:30 PM   Specimen: BLOOD LEFT ARM  Result Value Ref Range Status   Specimen Description BLOOD LEFT ARM  Final   Special Requests   Final    BOTTLES DRAWN AEROBIC AND ANAEROBIC Blood Culture adequate volume   Culture   Final     NO GROWTH 5 DAYS Performed at [facility], [address], [place], [place] [address]    Report Status 03/23/2024 FINAL  Final  Culture, blood (  Routine X 2) w Reflex to ID Panel     Status: None (Preliminary result)   Collection Time: 03/20/24  2:03 PM   Specimen: BLOOD  Result Value Ref Range Status   Specimen Description BLOOD SITE NOT SPECIFIED  Final   Special Requests   Final    BOTTLES DRAWN AEROBIC AND ANAEROBIC Blood Culture adequate volume   Culture   Final    NO GROWTH 4 DAYS Performed at [facility], [address] [place], [place] [address]    Report Status PENDING  Incomplete  Culture, blood (Routine X 2) w Reflex to ID Panel     Status: None (Preliminary result)   Collection Time: 03/20/24  2:03 PM   Specimen: BLOOD  Result Value Ref Range Status   Specimen Description BLOOD SITE NOT SPECIFIED  Final   Special Requests   Final    BOTTLES DRAWN AEROBIC AND ANAEROBIC Blood Culture adequate volume   Culture   Final    NO GROWTH 4 DAYS Performed at [facility], [address] [place], [place] [address]    Report Status PENDING  Incomplete  Respiratory (~20 pathogens) panel by PCR     Status: None   Collection Time: 03/21/24  9:48 AM   Specimen: Nasopharyngeal Swab; Respiratory  Result Value Ref Range Status   Adenovirus NOT DETECTED NOT DETECTED Final   Coronavirus 229E NOT DETECTED NOT DETECTED Final    Comment: (NOTE) The Coronavirus on the Respiratory Panel, DOES NOT test for the novel  Coronavirus (2019 nCoV)    Coronavirus HKU1 NOT DETECTED NOT DETECTED Final   Coronavirus NL63 NOT DETECTED NOT DETECTED Final   Coronavirus OC43 NOT DETECTED NOT DETECTED Final   Metapneumovirus NOT DETECTED NOT DETECTED Final   Rhinovirus / Enterovirus NOT DETECTED NOT DETECTED Final   Influenza A NOT DETECTED NOT DETECTED Final   Influenza B NOT DETECTED NOT DETECTED Final   Parainfluenza Virus 1 NOT DETECTED NOT DETECTED Final   Parainfluenza Virus  2 NOT DETECTED NOT DETECTED Final   Parainfluenza Virus 3 NOT DETECTED NOT DETECTED Final   Parainfluenza Virus 4 NOT DETECTED NOT DETECTED Final   Respiratory Syncytial Virus NOT DETECTED NOT DETECTED Final   Bordetella pertussis NOT DETECTED NOT DETECTED Final   Bordetella Parapertussis NOT DETECTED NOT DETECTED Final   Chlamydophila pneumoniae NOT DETECTED NOT DETECTED Final   Mycoplasma pneumoniae NOT DETECTED NOT DETECTED Final    Comment: Performed at [facility], [address], [place], [place] [address]  MRSA Next Gen by PCR, Nasal     Status: None   Collection Time: 03/21/24 11:25 AM   Specimen: Nasal Mucosa; Nasal Swab  Result Value Ref Range Status   MRSA by PCR Next Gen NOT DETECTED NOT DETECTED Final    Comment: (NOTE) The GeneXpert MRSA Assay (FDA approved for NASAL specimens only), is one component of a comprehensive MRSA colonization surveillance program. It is not intended to diagnose MRSA infection nor to guide or monitor treatment for MRSA infections. Test performance is not FDA approved in patients less than 55 years old. Performed at [facility], [address] [place], [place] [address]      Radiology Studies: No results found.  Scheduled Meds:  enoxaparin  (LOVENOX ) injection  40 mg Subcutaneous Q24H   metoCLOPramide  (REGLAN ) injection  10 mg Intravenous Q8H   mirtazapine   7.5 mg Oral QHS   pantoprazole   40 mg Oral Daily   Continuous  Infusions:  sodium chloride  125 mL/hr at 03/24/24 0249     LOS: 5 days   Norval Bar, MD  [facility]  03/24/2024, 12:19 PM

## 2024-03-24 NOTE — Consult Note (Signed)
 Consultation  Referring Provider: [facility]q [facility] Primary Care Physician:  Patient, No Pcp Per Primary Gastroenterologist: Sam[place]  Reason for Consultation: Nausea, poor oral intake in setting of acute illness  HPI: Kalasia Crafton is a 28 y.o. female generally previously healthy with history of GERD and migraines.  Patient was admitted to the hospital 6 days ago after being ill at home for a little over a week with intermittent fevers, generalized weakness, myalgias and fevers to 103.  She had some mild headaches but nothing persistent, no vomiting, no diarrhea, no abdominal pain, no arthralgias, no rash, no respiratory or ENT symptoms.  She did develop flank pain on the right. On admission UA was positive and she was started on antibiotics for UTI and possible pyelonephritis. CT of the abdomen pelvis on 03/18/2024 was negative, no adenopathy.  Chest CT portion with no pleural effusion or pneumothorax, no evident adenopathy, mildly increased subsegmental atelectasis both lung bases Chest x-ray 03/18/2024 negative Repeat chest x-ray 1210 mild streaky left infrahilar opacity favored to be bronchovascular crowding less likely pneumonia or aspiration. Blood cultures negative Respiratory panel negative Urine culture with multiple species suggest recollection. Multiple viral studies have been done and negative thus far Toxoplasma antibodies pending, CMV IgM pending EBV pending/Lyme pending/Ehrlichia pending ACE level within normal limits Monoscreen negative  Infectious diseases involved and following.  Patient is currently off antibiotics. Unfortunately she has continued to spike fevers-102.8 last night.  She denies any GI symptoms other than nausea which typically is bad when she has a fever.  She says the fevers have been lasting for hours and she feels very nauseated and sick during that time but has not been vomiting.  She has no complaints of abdominal pain, no dysphagia or  odynophagia, no diarrhea. Since yesterday she says her oral intake has been better and her nausea is improved.  She has thus far today had a biscuit, grapes and a banana and yesterday was able to eat some fruit and a grilled cheese. She is also drinking Gatorade and was able to get 64 ounces of Gatorade and altogether yesterday.  He is requiring still intermittent fluid boluses and is tachycardic during her febrile episodes and has had some intermittent hypotension associated.  Labs from 03/23/2024 WBC 4.8/hemoglobin 11.5/hematocrit 34.0 Potassium 3.2 BUN less than 5/creatinine 0.54 LFTs on 03/19/2024 normal with exception of ALT at 45   No prior EGD, no previous GI evaluation.  Past Medical History:  Diagnosis Date   Anemia    GERD (gastroesophageal reflux disease)    Migraine     Past Surgical History:  Procedure Laterality Date   NO PAST SURGERIES      Prior to Admission medications  Medication Sig Start Date End Date Taking? Authorizing Provider  azithromycin  (ZITHROMAX ) 250 MG tablet Take 1 tablet (250 mg total) by mouth daily. Take first 2 tablets together, then 1 every day until finished. 03/14/24  Yes Simon Lavonia SAILOR, MD  benzonatate  (TESSALON ) 200 MG capsule Take 1 capsule (200 mg total) by mouth 2 (two) times daily as needed for cough. 03/09/24  Yes Hawks, Christy A, FNP  cefpodoxime  (VANTIN ) 200 MG tablet Take 1 tablet (200 mg total) by mouth 2 (two) times daily for 12 days. 03/14/24 03/26/24 Yes Simon Lavonia SAILOR, MD  Ibuprofen  (ADVIL  LIQUI-GELS MINIS) 200 MG CAPS Take 400 mg by mouth every 6 (six) hours as needed (fever, pain, headache).   Yes [provider]  levonorgestrel (MIRENA, 52 MG,) 20 MCG/DAY IUD  PROVIDED BY CARE [facility]ENTER 04/26/22  Yes [provider]  predniSONE  (STERAPRED UNI-PAK 21 TAB) 10 MG (21) TBPK tablet Use as directed Patient not taking: Reported on 03/18/2024 03/09/24   Lavell Bari LABOR, FNP    Current Facility-Administered Medications   Medication Dose Route Frequency Provider Last Rate Last Admin   acetaminophen  (TYLENOL ) tablet 650 mg  650 mg Oral Q6H PRN Gherghe, Costin M, MD   650 mg at 03/24/24 0434   Or   acetaminophen  (TYLENOL ) suppository 650 mg  650 mg Rectal Q6H PRN Gherghe, Costin M, MD       enoxaparin  (LOVENOX ) injection 40 mg  40 mg Subcutaneous Q24H Gherghe, Costin M, MD   40 mg at 03/18/24 1945   ibuprofen  (ADVIL ) tablet 400 mg  400 mg Oral Once PRN Rathore, Vasundhra, MD       metoCLOPramide  (REGLAN ) injection 10 mg  10 mg Intravenous Q8H Tariq, Hassan, MD   10 mg at 03/24/24 9393   mirtazapine  (REMERON ) tablet 7.5 mg  7.5 mg Oral QHS Tariq, Hassan, MD   7.5 mg at 03/23/24 2244   ondansetron  (ZOFRAN ) tablet 4 mg  4 mg Oral Q6H PRN Gherghe, Costin M, MD       Or   ondansetron  (ZOFRAN ) injection 4 mg  4 mg Intravenous Q6H PRN Gherghe, Costin M, MD   4 mg at 03/22/24 1101   pantoprazole  (PROTONIX ) EC tablet 40 mg  40 mg Oral Daily Tariq, Hassan, MD   40 mg at 03/24/24 1047    Allergies as of 03/18/2024 - Review Complete 03/18/2024  Allergen Reaction Noted   Penicillins Hives 06/06/2014    Family History  Problem Relation Age of Onset   Healthy Mother    Migraines Mother    Healthy Father    Migraines Sister     Social History   Socioeconomic History   Marital status: Married    Spouse name: Not on file   Number of children: Not on file   Years of education: Not on file   Highest education level: Not on file  Occupational History   Not on file  Tobacco Use   Smoking status: Never   Smokeless tobacco: Never  Vaping Use   Vaping status: Never Used  Substance and Sexual Activity   Alcohol use: No   Drug use: No   Sexual activity: Yes    Birth control/protection: Injection  Other Topics Concern   Not on file  Social History Narrative   Caffeine; 3 sodas daily.   Work:  [employer]   Social Drivers of Health   Tobacco Use: Low Risk (03/18/2024)   Patient History     Smoking Tobacco Use: Never    Smokeless Tobacco Use: Never    Passive Exposure: Not on file  Financial Resource Strain: Not on file  Food Insecurity: No Food Insecurity (03/18/2024)   Epic    Worried About Programme Researcher, Broadcasting/film/video in the Last Year: Never true    Ran Out of Food in the Last Year: Never true  Transportation Needs: No Transportation Needs (03/18/2024)   Epic    Lack of Transportation (Medical): No    Lack of Transportation (Non-Medical): No  Physical Activity: Not on file  Stress: Not on file  Social Connections: Not on file  Intimate Partner Violence: Unknown (03/18/2024)   Epic    Fear of Current or Ex-Partner: No    Emotionally Abused: No    Physically Abused: Not on file  Sexually Abused: Patient declined  Depression (EYV7-0): Not on file  Alcohol Screen: Not on file  Housing: Unknown (03/18/2024)   Epic    Unable to Pay for Housing in the Last Year: No    Number of Times Moved in the Last Year: Not on file    Homeless in the Last Year: No  Utilities: Not At Risk (03/18/2024)   Epic    Threatened with loss of utilities: No  Health Literacy: Not on file    Review of Systems: Pertinent positive and negative review of systems were noted in the above HPI section.  All other review of systems was otherwise negative.   Physical Exam: Vital signs in last 24 hours: Temp:  [98.3 F (36.8 C)-102.6 F (39.2 C)] 100.9 F (38.3 C) (12/15 1331) Pulse Rate:  [108-133] 116 (12/15 1331) Resp:  [17-29] 18 (12/15 1331) BP: (97-118)/(57-74) 105/64 (12/15 1331) SpO2:  [95 %-99 %] 99 % (12/15 1131) Weight:  [89.2 kg] 89.2 kg (12/15 0600) Last BM Date : 03/21/24 General:   Alert,  Well-developed, well-nourished, young white female pleasant and cooperative in NAD-flushed appearing-temp 100.9 while I was in the room Head:  Normocephalic and atraumatic. Eyes:  Sclera clear, no icterus.   Conjunctiva pink. Ears:  Normal auditory acuity. Nose:  No deformity, discharge,  or  lesions. Mouth:  No deformity or lesions.   Neck:  Supple; no masses or thyromegaly. Lungs:  Clear throughout to auscultation.   No wheezes, crackles, or rhonchi . Heart: Tachycardic 118 regular rate and rhythm; no murmurs, clicks, rubs,  or gallops. Abdomen:  Soft, obese, nontender, BS active,nonpalp mass or hsm.   Rectal:  Deferred  Msk:  Symmetrical without gross deformities. . Pulses:  Normal pulses noted. Extremities:  Without clubbing or edema. Neurologic:  Alert and  oriented x4;  grossly normal neurologically. Skin:  Intact without significant lesions or rashes.. Psych:  Alert and cooperative. Normal mood and affect.  Intake/Output from previous day: 12/14 0701 - 12/15 0700 In: 2704.8 [P.O.:600; I.V.:1804.8; IV Piggyback:300] Out: -  Intake/Output this shift: No intake/output data recorded.  Lab Results: Recent Labs    03/23/24 0326  WBC 4.8  HGB 11.5*  HCT 34.0*  PLT 169   BMET Recent Labs    03/23/24 0326 03/24/24 0307  NA 137 142  K 3.2* 3.8  CL 106 112*  CO2 18* 21*  GLUCOSE 91 93  BUN <5* <5*  CREATININE 0.54 0.60  CALCIUM 7.5* 7.4*   LFT No results for input(s): PROT, ALBUMIN, AST, ALT, ALKPHOS, BILITOT, BILIDIR, IBILI in the last 72 hours. PT/INR No results for input(s): LABPROT, INR in the last 72 hours. Hepatitis Panel No results for input(s): HEPBSAG, HCVAB, HEPAIGM, HEPBIGM in the last 72 hours.   IMPRESSION:  #56 28 year old white female with prolonged acute illness over the past 2 weeks with high fevers, weakness, initial presentation with associated right flank pain which has resolved.  Did take a course of antibiotics for possible pyelonephritis though that was not definitely proven. Off antibiotics currently felt most likely to have a viral illness with multiple viral markers still pending. Persistent fevers  #2 nausea and poor oral intake acute, associated with acute illness.  Patient does not have any focal  GI symptoms and has not been vomiting.  She says the nausea is usually worse for several hours around the time of fevers and she does not have any appetite during that time.  She has better over the past 36  hours and has been successful keeping down solid food, and was able to get down 64 ounces of Gatorade yesterday.  #3 intermittent hypotension and persistent tachycardia-both exacerbated by fevers  Plan; Encourage patient to continue to push oral intake and at least 64 ounces of Gatorade per day if not more.  Protonix  was just started yesterday, continue Protonix  40 mg daily Will switch Zofran  to around-the-clock over the next 24 hours or so to see if this further averts be episodes of nausea  Do not feel that EGD is indicated at this time and would be unlikely to change our management.  Feeding tube not indicated at present as she has had improved oral intake over the past 36 hours.  Will check on patient again in AM.       Maraki Macquarrie PA-C 03/24/2024, 2:04 PM

## 2024-03-24 NOTE — Plan of Care (Signed)

## 2024-03-24 NOTE — Plan of Care (Signed)

## 2024-03-25 ENCOUNTER — Other Ambulatory Visit (HOSPITAL_COMMUNITY): Payer: Self-pay

## 2024-03-25 ENCOUNTER — Other Ambulatory Visit: Payer: Self-pay

## 2024-03-25 DIAGNOSIS — R630 Anorexia: Secondary | ICD-10-CM

## 2024-03-25 DIAGNOSIS — R509 Fever, unspecified: Secondary | ICD-10-CM

## 2024-03-25 LAB — EHRLICHIA ANTIBODY PANEL
E chaffeensis (HGE) Ab, IgG: NEGATIVE
E chaffeensis (HGE) Ab, IgM: NEGATIVE
E. Chaffeensis (HME) IgM Titer: NEGATIVE
E.Chaffeensis (HME) IgG: NEGATIVE

## 2024-03-25 LAB — CULTURE, BLOOD (ROUTINE X 2)
Culture: NO GROWTH
Culture: NO GROWTH
Special Requests: ADEQUATE
Special Requests: ADEQUATE

## 2024-03-25 MED ORDER — DOXYCYCLINE HYCLATE 100 MG PO TABS
100.0000 mg | ORAL_TABLET | Freq: Two times a day (BID) | ORAL | 0 refills | Status: AC
  Filled 2024-03-25: qty 28, 14d supply, fill #0

## 2024-03-25 MED ORDER — LACTATED RINGERS IV SOLN: INTRAVENOUS | Status: DC

## 2024-03-25 MED ORDER — DOXYCYCLINE HYCLATE 100 MG PO TABS
100.0000 mg | ORAL_TABLET | Freq: Two times a day (BID) | ORAL | Status: DC
  Administered 2024-03-25: 10:00:00 100 mg via ORAL
  Filled 2024-03-25: qty 1

## 2024-03-25 MED ORDER — MIRTAZAPINE 7.5 MG PO TABS
7.5000 mg | ORAL_TABLET | Freq: Every day | ORAL | 0 refills | Status: AC
  Filled 2024-03-25: qty 30, 30d supply, fill #0

## 2024-03-25 MED ORDER — METOCLOPRAMIDE HCL 10 MG PO TABS
10.0000 mg | ORAL_TABLET | Freq: Four times a day (QID) | ORAL | 0 refills | Status: AC | PRN
  Filled 2024-03-25: qty 90, 23d supply, fill #0

## 2024-03-25 MED ORDER — PANTOPRAZOLE SODIUM 40 MG PO TBEC
40.0000 mg | DELAYED_RELEASE_TABLET | Freq: Every day | ORAL | 0 refills | Status: AC
  Filled 2024-03-25: qty 30, 30d supply, fill #0

## 2024-03-25 MED ORDER — SODIUM CHLORIDE 0.9 % IV SOLN: INTRAVENOUS | Status: DC

## 2024-03-25 NOTE — Progress Notes (Signed)
 Reynolds Helling Gajda to be D/C'd Home per MD order.  Discussed with the patient and all questions fully answered.  VSS, Skin clean, dry and intact without evidence of skin break down, no evidence of skin tears noted. IV catheter discontinued intact. Site without signs and symptoms of complications. Dressing and pressure applied.  An After Visit Summary was printed and given to the patient. Patient received prescription.  D/c education completed with patient/family including follow up instructions, medication list, d/c activities limitations if indicated, with other d/c instructions as indicated by MD - patient able to verbalize understanding, all questions fully answered.   Patient instructed to return to ED, call 911, or call MD for any changes in condition.   Patient escorted via WC, and D/C home via private auto.  Rocky HERO Lessa Huge 03/25/2024 4:42 PM

## 2024-03-25 NOTE — TOC Transition Note (Signed)
 Transition of Care (TOC) - Discharge Note Rayfield Gobble RN, BSN Inpatient Care Management Unit 4E- RN Case Manager See Treatment Team for direct phone #   Patient Details  Name: Helen White MRN: [id number] Date of Birth: [birth year]  [facility]) CM/SW Contact:  Gobble Rayfield Hurst, RN Phone Number: 03/25/2024, 4:02 PM   Clinical Narrative:    Pt stable for transition home today, Pt voiced she plans to make her own PCP follow up- no other IPCM needs noted for discharge.  Family to transport home   Final next level of care: Home/Self Care Barriers to Discharge: Barriers Resolved   Patient Goals and CMS Choice Patient states their goals for this hospitalization and ongoing recovery are:: return home          Discharge Placement               Home        Discharge Plan and Services Additional resources added to the After Visit Summary for                                       Social Drivers of Health (SDOH) Interventions SDOH Screenings   Food Insecurity: No Food Insecurity (03/18/2024)  Housing: Unknown (03/18/2024)  Transportation Needs: No Transportation Needs (03/18/2024)  Utilities: Not At Risk (03/18/2024)  Tobacco Use: Low Risk (03/18/2024)     Readmission Risk Interventions     No data to display

## 2024-03-25 NOTE — Progress Notes (Signed)
°   03/25/24 0801  Assess: MEWS Score  Temp (!) 102.8 F (39.3 C)  BP 107/65  MAP (mmHg) 78  Pulse Rate (!) 116  ECG Heart Rate (!) 115  Resp 18  SpO2 96 %  O2 Device Room Air  Assess: MEWS Score  MEWS Temp 2  MEWS Systolic 0  MEWS Pulse 2  MEWS RR 0  MEWS LOC 0  MEWS Score 4  MEWS Score Color Red  Assess: if the MEWS score is Yellow or Red  Were vital signs accurate and taken at a resting state? Yes  Does the patient meet 2 or more of the SIRS criteria? Yes  Does the patient have a confirmed or suspected source of infection? No  MEWS guidelines implemented  No, previously red, continue vital signs every 4 hours  Provider Notification  Provider Name/Title Dr.Hassan Tariq  Date Provider Notified 03/25/24  Time Provider Notified [phone]  Method of Notification Page  Notification Reason Other (Comment)  Provider response No new orders  Date of Provider Response 03/25/24  Time of Provider Response 0815  Assess: SIRS CRITERIA  SIRS Temperature  1  SIRS Respirations  0  SIRS Pulse 1  SIRS WBC 0  SIRS Score Sum  2

## 2024-03-25 NOTE — Progress Notes (Signed)
 [facility] night cross cover note:   I was notified by the patient's RN that the patient spiked an additional elevated temp this evening, which is improving with prn tylenol . Sinus tach persists, with HR's in the 1-teens to low 120's. Most recent SBP's in the mid-90's mmHg. Will start NS at 125 cc/hr x 8 hours.      Eva Pore, DO Hospitalist

## 2024-03-25 NOTE — Progress Notes (Signed)
° ° °  Progress Note   Assessment    28 year old female without significant past medical history admitted with intermittent fevers, malaise and weakness, nausea and poor p.o. intake  Principal Problem:   Pyelonephritis Active Problems:   Fever of unknown origin (FUO)   Nausea without vomiting   Poor appetite   Recommendations   1.  Nausea/poor p.o. intake --secondary to acute, presumed infectious, febrile illness.  Improving -- No role for EGD -- Okay for empiric PPI for 2 to 4 weeks if helpful -- As needed ondansetron  4 mg every 8 hours as needed  2.  Febrile illness --positive CMV, EBV and Lyme IgM.  Difficult to believe this is all 3 infectious etiologies.  Defer to ID -- EBV and CMV viral titers pending  GI will sign off, call if questions   Chief Complaint   Patient feeling better, wants to go home Appetite continues to improve No further nausea or vomiting Intermittent fevers  Vital signs in last 24 hours: Temp:  [98.2 F (36.8 C)-103 F (39.4 C)] 98.2 F (36.8 C) (12/16 1211) Pulse Rate:  [105-125] 105 (12/16 1211) Resp:  [18-27] 19 (12/16 1211) BP: (95-121)/(55-70) 100/61 (12/16 1211) SpO2:  [94 %-100 %] 100 % (12/16 1211) Weight:  [89 kg] 89 kg (12/16 0651) Last BM Date : 03/24/24 Gen: awake, alert, NAD HEENT: anicteric  Neuro: nonfocal  Intake/Output from previous day: 12/15 0701 - 12/16 0700 In: 2929.3 [P.O.:480; I.V.:1433.5; IV Piggyback:1015.8] Out: -  Intake/Output this shift: No intake/output data recorded.  Lab Results: Recent Labs    03/23/24 0326  WBC 4.8  HGB 11.5*  HCT 34.0*  PLT 169   BMET Recent Labs    03/23/24 0326 03/24/24 0307  NA 137 142  K 3.2* 3.8  CL 106 112*  CO2 18* 21*  GLUCOSE 91 93  BUN <5* <5*  CREATININE 0.54 0.60  CALCIUM 7.5* 7.4*   CMV IgM + EBV IgM + Lyme IgM +   LOS: 6 days   Gordy CHRISTELLA Starch, MD 03/25/2024, 2:19 PM See [facility], [facility], to contact our on call provider

## 2024-03-25 NOTE — Discharge Summary (Signed)
 [facility] Physician Discharge Summary   Patient name: Helen White  Admit date:     03/18/2024  Discharge date: 03/25/2024  Attending Physician: Mykayla Brinton, HASSAN [[id number]]  Discharge Physician: Norval Bar   PCP: Patient, No Pcp Per  Admitted From: Home  Disposition:  Home  Recommendations for Outpatient Follow-up:  Follow up with PCP in 1-2 weeks Follow up repeat lab work in one week Follow up with rheumatology if continue to have fevers after doxycycline  course, referral given  Home Health:No Equipment/Devices: @ECDMELIST @  Discharge Condition:Stable CODE STATUS:FULL Diet recommendation: Regular Fluid Restriction: None  Hospital Summary: 28 y.o. female with without past medical history, not taking any prescription medications on a regular basis comes into the hospital with generalized weakness, fevers, right flank pain.  Patient has been having high fevers for the past 9 days, coming and going.  She would have couple days with fevers and feeling very weak, and then a day or 2 of feeling fairly normal and then symptoms returning.  She presented to the emergency room here on 12/5 with similar symptoms, she was diagnosed with atypical pneumonia based on a chest x-ray, also UTI given positive urinalysis, she was prescribed azithromycin  and cefpodoxime  and sent home.  She took the antibiotics for couple of days, however her symptoms persisted and newly developed right flank pain with persistent fevers so decided come back to the emergency room.  She denies any vomiting but did report some intermittent nausea.  She complains of a cough but mainly when sitting down, no sputum production.  She denies any dysuria.   Hospital Course by Problem:  Fever of unknown origin Etiology remains unclear. Concern for likely viral illness vs rheumatologic disease. No recent travel, sick contacts, medication changes. CXR unremarkable. CT C/A/P on 12/9 unremarkable. UA unremarkable but was  recently on Abx as outpatient. Covid/flu/RSV negative. Respiratory viral panel negative. HIV screen negative. RPR not reactive. MRSA screen negative. Blood cultures with no growth. CRP 5.9, ESR 5. Monospot test negative. EBV IgM positive, CMV IgM positive. ANA negative, RF negative, ferritin not elevated. Treated empirically with antibiotics that was all discontinued once concern for bacterial infection deemed low. Given a lot of IV fluids to account for fluid losses due to recurrent fevers. Lyme IgM positive. Seen by infectious disease this admission.  - Concern for active CMV or EBV infection is low as they may be falsely positive per ID team. However, EBV DNA testing has been sent at this time and will need follow up as outpatient. - Plan to try 14 day course of doxycycline  for lyme disease - if continues to spike fevers despite antibiotic course, will need to follow up with rheumatology for fevers of unknown origin, referral given - given PCP information to establish care within 1-2 weeks    Poor oral intake/Hypotension Etiology unclear, likely in the setting of sepsis and poor oral intake. Improving. Treated with IV fluids, antiemetics and PPI trial. Seen by [facility] ang GI team who agree symptoms are likely in the setting of acute viral illness. - cont zofran  PRN - cont trial for pantoprazole  daily - follow up with PCP within one week   Sinus tachycardia EKG 12/11 confirmed. In the setting of dehydration from acute viral illness with recurrent fevers. Status post IV fluids - encourage oral intake/hydration    Hypokalemia Likely in the setting of poor oral intake - repeat blood work in one week, script given   Discharge Diagnoses:  Principal Problem:   Pyelonephritis Active  Problems:   Fever of unknown origin (FUO)   Nausea without vomiting   Poor appetite   Discharge Instructions  Discharge Instructions     Increase activity slowly   Complete by: As directed       Allergies as  of 03/25/2024       Reactions   Penicillins Hives   Tolerates cephalosporins - possible allergic reaction to Zosyn  03/20/24          Medication List     STOP taking these medications    azithromycin  250 MG tablet Commonly known as: ZITHROMAX    cefpodoxime  200 MG tablet Commonly known as: VANTIN        TAKE these medications    Advil  Liqui-Gels minis 200 MG Caps Generic drug: Ibuprofen  Take 400 mg by mouth every 6 (six) hours as needed (fever, pain, headache).   benzonatate  200 MG capsule Commonly known as: TESSALON  Take 1 capsule (200 mg total) by mouth 2 (two) times daily as needed for cough.   doxycycline  100 MG tablet Commonly known as: VIBRA -TABS Take 1 tablet (100 mg total) by mouth every 12 (twelve) hours for 14 days.   metoCLOPramide  10 MG tablet Commonly known as: REGLAN  Take 1 tablet (10 mg total) by mouth every 6 (six) hours as needed for nausea.   Mirena (52 MG) 20 MCG/DAY Iud Generic drug: levonorgestrel PROVIDED BY [facility]   mirtazapine  7.5 MG tablet Commonly known as: REMERON  Take 1 tablet (7.5 mg total) by mouth at bedtime.   pantoprazole  40 MG tablet Commonly known as: PROTONIX  Take 1 tablet (40 mg total) by mouth daily. Start taking on: March 26, 2024   predniSONE  10 MG (21) Tbpk tablet Commonly known as: STERAPRED UNI-PAK 21 TAB Use as directed        Allergies[1]  Discharge Exam: Vitals:   03/25/24 0927 03/25/24 1211  BP:  100/61  Pulse:  (!) 105  Resp: (!) 23 19  Temp: 98.3 F (36.8 C) 98.2 F (36.8 C)  SpO2:  100%    Physical Exam Vitals and nursing note reviewed.  Constitutional:      General: She is not in acute distress.    Appearance: She is ill-appearing.  HENT:     Head: Normocephalic and atraumatic.  Cardiovascular:     Rate and Rhythm: Normal rate and regular rhythm.     Pulses: Normal pulses.     Heart sounds: Normal heart sounds.  Pulmonary:     Effort: Pulmonary effort is normal.     Breath  sounds: Normal breath sounds.  Abdominal:     General: Bowel sounds are normal.     Palpations: Abdomen is soft.  Neurological:     Mental Status: She is alert.     The results of significant diagnostics from this hospitalization (including imaging, microbiology, ancillary and laboratory) are listed below for reference.    Microbiology: Recent Results (from the past 240 hours)  Resp panel by RT-PCR (RSV, Flu A&B, Covid) Anterior Nasal Swab     Status: None   Collection Time: 03/18/24 10:50 AM   Specimen: Anterior Nasal Swab  Result Value Ref Range Status   SARS Coronavirus 2 by RT PCR NEGATIVE NEGATIVE Final   Influenza A by PCR NEGATIVE NEGATIVE Final   Influenza B by PCR NEGATIVE NEGATIVE Final    Comment: (NOTE) The Xpert Xpress SARS-CoV-2/FLU/RSV plus assay is intended as an aid in the diagnosis of influenza from Nasopharyngeal swab specimens and should not be used as a  sole basis for treatment. Nasal washings and aspirates are unacceptable for Xpert Xpress SARS-CoV-2/FLU/RSV testing.  Fact Sheet for Patients: bloggercourse.com  Fact Sheet for Healthcare Providers: seriousbroker.it  This test is not yet approved or cleared by the [place]  FDA and has been authorized for detection and/or diagnosis of SARS-CoV-2 by FDA under an Emergency Use Authorization (EUA). This EUA will remain in effect (meaning this test can be used) for the duration of the COVID-19 declaration under Section 564(b)(1) of the Act, 21 U.S.C. section 360bbb-3(b)(1), unless the authorization is terminated or revoked.     Resp Syncytial Virus by PCR NEGATIVE NEGATIVE Final    Comment: (NOTE) Fact Sheet for Patients: bloggercourse.com  Fact Sheet for Healthcare Providers: seriousbroker.it  This test is not yet approved or cleared by the [place]  FDA and has been authorized for detection  and/or diagnosis of SARS-CoV-2 by FDA under an Emergency Use Authorization (EUA). This EUA will remain in effect (meaning this test can be used) for the duration of the COVID-19 declaration under Section 564(b)(1) of the Act, 21 U.S.C. section 360bbb-3(b)(1), unless the authorization is terminated or revoked.  Performed at [facility], [address] [place], [place] [address]   Blood culture (routine x 2)     Status: None   Collection Time: 03/18/24 11:58 AM   Specimen: BLOOD RIGHT FOREARM  Result Value Ref Range Status   Specimen Description BLOOD RIGHT FOREARM  Final   Special Requests   Final    BOTTLES DRAWN AEROBIC AND ANAEROBIC Blood Culture results may not be optimal due to an inadequate volume of blood received in culture bottles   Culture   Final    NO GROWTH 5 DAYS Performed at [facility], [address] [place], [place] [address]    Report Status 03/23/2024 FINAL  Final  Urine Culture     Status: Abnormal   Collection Time: 03/18/24 12:23 PM   Specimen: Urine, Clean Catch  Result Value Ref Range Status   Specimen Description URINE, CLEAN CATCH  Final   Special Requests   Final    NONE Performed at [facility], [address], [place], [place] [address]    Culture MULTIPLE SPECIES PRESENT, SUGGEST RECOLLECTION (A)  Final   Report Status 03/19/2024 FINAL  Final  Culture, blood (single)     Status: None   Collection Time: 03/18/24  5:30 PM   Specimen: BLOOD LEFT ARM  Result Value Ref Range Status   Specimen Description BLOOD LEFT ARM  Final   Special Requests   Final    BOTTLES DRAWN AEROBIC AND ANAEROBIC Blood Culture adequate volume   Culture   Final    NO GROWTH 5 DAYS Performed at [facility], [address] [place], [place] [address]    Report Status 03/23/2024 FINAL  Final  Culture, blood (Routine X 2) w Reflex to ID Panel     Status: None   Collection Time: 03/20/24  2:03 PM   Specimen: BLOOD  Result Value Ref Range  Status   Specimen Description BLOOD SITE NOT SPECIFIED  Final   Special Requests   Final    BOTTLES DRAWN AEROBIC AND ANAEROBIC Blood Culture adequate volume   Culture   Final    NO GROWTH 5 DAYS Performed at [facility], [address] [place], [place] [address]    Report Status 03/25/2024 FINAL  Final  Culture, blood (Routine X 2) w Reflex to  ID Panel     Status: None   Collection Time: 03/20/24  2:03 PM   Specimen: BLOOD  Result Value Ref Range Status   Specimen Description BLOOD SITE NOT SPECIFIED  Final   Special Requests   Final    BOTTLES DRAWN AEROBIC AND ANAEROBIC Blood Culture adequate volume   Culture   Final    NO GROWTH 5 DAYS Performed at [facility], [address], [place], [place] [address]    Report Status 03/25/2024 FINAL  Final  Respiratory (~20 pathogens) panel by PCR     Status: None   Collection Time: 03/21/24  9:48 AM   Specimen: Nasopharyngeal Swab; Respiratory  Result Value Ref Range Status   Adenovirus NOT DETECTED NOT DETECTED Final   Coronavirus 229E NOT DETECTED NOT DETECTED Final    Comment: (NOTE) The Coronavirus on the Respiratory Panel, DOES NOT test for the novel  Coronavirus (2019 nCoV)    Coronavirus HKU1 NOT DETECTED NOT DETECTED Final   Coronavirus NL63 NOT DETECTED NOT DETECTED Final   Coronavirus OC43 NOT DETECTED NOT DETECTED Final   Metapneumovirus NOT DETECTED NOT DETECTED Final   Rhinovirus / Enterovirus NOT DETECTED NOT DETECTED Final   Influenza A NOT DETECTED NOT DETECTED Final   Influenza B NOT DETECTED NOT DETECTED Final   Parainfluenza Virus 1 NOT DETECTED NOT DETECTED Final   Parainfluenza Virus 2 NOT DETECTED NOT DETECTED Final   Parainfluenza Virus 3 NOT DETECTED NOT DETECTED Final   Parainfluenza Virus 4 NOT DETECTED NOT DETECTED Final   Respiratory Syncytial Virus NOT DETECTED NOT DETECTED Final   Bordetella pertussis NOT DETECTED NOT DETECTED Final   Bordetella Parapertussis NOT DETECTED NOT DETECTED  Final   Chlamydophila pneumoniae NOT DETECTED NOT DETECTED Final   Mycoplasma pneumoniae NOT DETECTED NOT DETECTED Final    Comment: Performed at [facility], [address], [place], [place] [address]  MRSA Next Gen by PCR, Nasal     Status: None   Collection Time: 03/21/24 11:25 AM   Specimen: Nasal Mucosa; Nasal Swab  Result Value Ref Range Status   MRSA by PCR Next Gen NOT DETECTED NOT DETECTED Final    Comment: (NOTE) The GeneXpert MRSA Assay (FDA approved for NASAL specimens only), is one component of a comprehensive MRSA colonization surveillance program. It is not intended to diagnose MRSA infection nor to guide or monitor treatment for MRSA infections. Test performance is not FDA approved in patients less than 53 years old. Performed at [facility], [address] [place], [place] [address]      Labs: ProBNP, BNP (last 5 results) No results for input(s): PROBNP, BNP in the last 8760 hours. Basic Metabolic Panel: Recent Labs  Lab 03/19/24 0449 03/20/24 0954 03/23/24 0326 03/24/24 0307  NA 137 136 137 142  K 3.3* 3.6 3.2* 3.8  CL 108 106 106 112*  CO2 26 22 18* 21*  GLUCOSE 93 106* 91 93  BUN <5* <5* <5* <5*  CREATININE 0.61 0.57 0.54 0.60  CALCIUM 7.6* 7.5* 7.5* 7.4*  MG  --   --  1.5* 1.8  PHOS  --   --  2.7  --    Liver Function Tests: Recent Labs  Lab 03/19/24 0449  AST 40  ALT 45*  ALKPHOS 55  BILITOT 0.7  PROT 5.4*  ALBUMIN 2.2*   No results for input(s): LIPASE, AMYLASE in the last 168 hours. No results for input(s): AMMONIA in the last 168  hours. CBC: Recent Labs  Lab 03/19/24 0449 03/20/24 0425 03/20/24 2157 03/23/24 0326  WBC 4.0 4.5 4.3 4.8  NEUTROABS  --  2.4  --  3.5  HGB 11.7* 11.9* 11.4* 11.5*  HCT 34.3* 35.5* 34.0* 34.0*  MCV 84.9 83.5 84.4 84.2  PLT 197 202 183 169   Cardiac Enzymes: No results for input(s): CKTOTAL, CKMB, CKMBINDEX, TROPONINI, TROPONINIHS in the last 168 hours. BNP: No  results for input(s): BNP in the last 168 hours. CBG: No results for input(s): GLUCAP in the last 168 hours. D-Dimer No results for input(s): DDIMER in the last 72 hours. Hgb A1c No results for input(s): HGBA1C in the last 72 hours. Lipid Profile No results for input(s): CHOL, HDL, LDLCALC, TRIG, CHOLHDL, LDLDIRECT in the last 72 hours. Thyroid function studies Recent Labs    03/23/24 0938  TSH 1.855   Anemia work up No results for input(s): VITAMINB12, FOLATE, FERRITIN, TIBC, IRON, RETICCTPCT in the last 72 hours. Urinalysis    Component Value Date/Time   COLORURINE AMBER (A) 03/18/2024 1122   APPEARANCEUR CLEAR 03/18/2024 1122   LABSPEC 1.025 03/18/2024 1122   PHURINE 6.0 03/18/2024 1122   GLUCOSEU NEGATIVE 03/18/2024 1122   HGBUR SMALL (A) 03/18/2024 1122   BILIRUBINUR SMALL (A) 03/18/2024 1122   BILIRUBINUR moderate (A) 03/14/2024 1020   KETONESUR >80 (A) 03/18/2024 1122   PROTEINUR 30 (A) 03/18/2024 1122   UROBILINOGEN >=8.0 (A) 03/14/2024 1020   NITRITE NEGATIVE 03/18/2024 1122   LEUKOCYTESUR SMALL (A) 03/18/2024 1122   Sepsis Labs Recent Labs  Lab 03/19/24 0449 03/19/24 2128 03/20/24 0425 03/20/24 2157 03/23/24 0326  PROCALCITON  --  0.11  --   --   --   WBC 4.0  --  4.5 4.3 4.8    Procedures/Studies: DG Chest Port 1 View Result Date: 03/19/2024 EXAM: 1 VIEW(S) XRAY OF THE CHEST 03/19/2024 09:25:00 PM COMPARISON: AP and Lateral chest 03/18/2024. CLINICAL HISTORY: Fever. FINDINGS: LUNGS AND PLEURA: There is a low inspiration compared to the prior study. Mild streaky left infrahilar opacity is favored to be bronchovascular crowding, less likely pneumonia or aspiration. The lungs are otherwise clear. No pleural effusion. No pneumothorax. HEART AND MEDIASTINUM: There is mild cardiomegaly, stable mediastinum. No vascular congestion is seen. BONES AND SOFT TISSUES: No acute osseous abnormality. IMPRESSION: 1. Mild streaky left  infrahilar opacity favored to reflect bronchovascular crowding; pneumonia or aspiration is considered less likely. 2. Mild cardiomegaly without vascular congestion. Electronically signed by: Francis Quam MD 03/19/2024 09:32 PM EST RP Workstation: [id number]   CT CHEST ABDOMEN PELVIS W CONTRAST Result Date: 03/18/2024 CLINICAL DATA:  Sepsis. Right lower back pain with fever for the last several days. EXAM: CT CHEST, ABDOMEN, AND PELVIS WITH CONTRAST TECHNIQUE: Multidetector CT imaging of the chest, abdomen and pelvis was performed following the standard protocol during bolus administration of intravenous contrast. RADIATION DOSE REDUCTION: This exam was performed according to the departmental dose-optimization program which includes automated exposure control, adjustment of the mA and/or kV according to patient size and/or use of iterative reconstruction technique. CONTRAST:  75mL OMNIPAQUE  IOHEXOL  350 MG/ML SOLN COMPARISON:  Chest radiographs 03/18/2024. Abdominopelvic CT 03/14/2024. FINDINGS: CT CHEST FINDINGS Cardiovascular: No significant vascular findings. The heart size is normal. There is no pericardial effusion. Mediastinum/Nodes: There are no enlarged mediastinal, hilar or axillary lymph nodes. Small amount of residual thymic tissue in the anterior mediastinum, not uncommon for age. The thyroid gland, trachea and esophagus demonstrate no significant findings. Lungs/Pleura: No pleural effusion or  pneumothorax. Mildly increased subsegmental atelectasis at both lung bases. No confluent airspace disease or suspicious nodularity. Musculoskeletal/Chest wall: No chest wall mass or suspicious osseous findings. No evidence of discitis or osteomyelitis. CT ABDOMEN AND PELVIS FINDINGS Hepatobiliary: The liver is normal in density without suspicious focal abnormality. No evidence of gallstones, gallbladder wall thickening or biliary dilatation. Pancreas: Unremarkable. No pancreatic ductal dilatation or surrounding  inflammatory changes. Spleen: Normal in size without focal abnormality. Adrenals/Urinary Tract: Both adrenal glands appear normal. No evidence of urinary tract calculus, suspicious renal lesion or hydronephrosis. The bladder appears normal for its degree of distention. Stomach/Bowel: No enteric contrast administered. The stomach appears unremarkable for its degree of distension. No evidence of bowel wall thickening, distention or surrounding inflammatory change. The appendix appears normal. Vascular/Lymphatic: There are no enlarged abdominal or pelvic lymph nodes. No significant vascular findings. Reproductive: Intrauterine device in place. The uterus and ovaries otherwise appear unremarkable. No adnexal mass. Other: No evidence of abdominal wall mass or hernia. No ascites or pneumoperitoneum. Musculoskeletal: No acute or significant osseous findings. No evidence of discitis or osteomyelitis. The sacroiliac joints appear unremarkable. Stable mild irregularity of the symphysis pubis, likely developmental or secondary to previous childbirth. IMPRESSION: 1. No acute findings or explanation for the patient's symptoms. 2. Mildly increased subsegmental atelectasis at both lung bases. 3. Intrauterine device in place. Electronically Signed   By: Elsie Perone M.D.   On: 03/18/2024 15:43   DG Chest 2 View Result Date: 03/18/2024 CLINICAL DATA:  Shortness of breath. EXAM: CHEST - 2 VIEW COMPARISON:  03/14/2024 FINDINGS: The heart size and mediastinal contours are within normal limits. Both lungs are clear. Subtle interstitial opacity seen previously not evident on the current exam. The visualized skeletal structures are unremarkable. IMPRESSION: No active cardiopulmonary disease. Electronically Signed   By: Camellia Candle M.D.   On: 03/18/2024 11:35   CT ABDOMEN PELVIS WO CONTRAST Result Date: 03/14/2024 EXAM: CT ABDOMEN AND PELVIS WITHOUT CONTRAST 03/14/2024 04:41:16 PM TECHNIQUE: CT of the abdomen and pelvis was  performed without the administration of intravenous contrast. Multiplanar reformatted images are provided for review. Automated exposure control, iterative reconstruction, and/or weight-based adjustment of the mA/kV was utilized to reduce the radiation dose to as low as reasonably achievable. COMPARISON: None available. CLINICAL HISTORY: rule out kidney stone right side FINDINGS: LOWER CHEST: No acute abnormality. LIVER: The liver is unremarkable. GALLBLADDER AND BILE DUCTS: Gallbladder is unremarkable. No biliary ductal dilatation. SPLEEN: No acute abnormality. PANCREAS: No acute abnormality. ADRENAL GLANDS: No acute abnormality. KIDNEYS, URETERS AND BLADDER: No stones in the kidneys or ureters. No hydronephrosis. No perinephric or periureteral stranding. Urinary bladder is unremarkable. GI AND BOWEL: Stomach demonstrates no acute abnormality. There is no bowel obstruction. PERITONEUM AND RETROPERITONEUM: No ascites. No free air. VASCULATURE: Aorta is normal in caliber. LYMPH NODES: No lymphadenopathy. REPRODUCTIVE ORGANS: Intrauterine device is noted. BONES AND SOFT TISSUES: No acute osseous abnormality. No focal soft tissue abnormality. IMPRESSION: 1. No acute findings in the abdomen or pelvis. Electronically signed by: Lynwood Seip MD 03/14/2024 04:58 PM EST RP Workstation: [id number]   DG Chest 2 View Result Date: 03/14/2024 EXAM: 2 VIEW(S) XRAY OF THE CHEST 03/14/2024 10:26:43 AM COMPARISON: Portable chest x ray 07/02/2018. CLINICAL HISTORY: 28 year old female with fever and cough. FINDINGS: LUNGS AND PLEURA: Mild generalized increased pulmonary interstitium. No confluent lung opacity. No pleural effusion. No pneumothorax. HEART AND MEDIASTINUM: No acute abnormality of the cardiac and mediastinal silhouettes. BONES AND SOFT TISSUES: No acute osseous abnormality. IMPRESSION: 1.  Mild generalized increased pulmonary interstitium, suspicious for acute viral / atypical respiratory infection in this setting.  Electronically signed by: Helayne Hurst MD 03/14/2024 10:59 AM EST RP Workstation: [id number]    Time coordinating discharge: 40 mins  SIGNED:  Norval Bar, MD [facility] 03/25/2024, 2:51 PM     [1]  Allergies Allergen Reactions   Penicillins Hives    Tolerates cephalosporins - possible allergic reaction to Zosyn  03/20/24

## 2024-03-25 NOTE — Progress Notes (Addendum)
 [facility]  Date of Admission:  03/18/2024   Total days of inpatient antibiotics 7  Principal Problem:   Pyelonephritis Active Problems:   Fever of unknown origin (FUO)   Nausea without vomiting   Poor appetite          Assessment: 28 year old female admitted with #Fever secondary to unclear source - No leukocytosis on admission, blood cultures on admission 12/11 no growth -Workup including RPR, toxo serum, Ehrlichia have been negative hep C antibody nonreactive, HIV negative, RVP negative, COVID-negative, flu negative. - CMV IgM positive at 238, EBV IgG and IgM positive, LFTs on admission showed AST/ALT 43/57 next day 40/45 - Autoimmune workup including ANA, rheumatoid factor, p-ANCA, anti-MPO, anti-PR-3, ACE enzyme negative - Fevers continue, infectious workup has been essentially benign.  CT chest abdomen pelvis with contrast did not show any abnormalities,.  Besides fevers patient is asymptomatic.  She is inquiring about discharge. Recommendations: -Doxy x 2 weeks given IgM positive for Lyme although I think Lyme is the underlying etiology is highly unlikely given no thrombocytopenia, no reported tick exposure. - Ordered CMV and EBV DNA(she has 13 and 36 year old child at home). I suspect positive CMV IgM and EBV IgM are likely reflective of reactivation rather than etiology of underlying fever as  her LFTs were only mildly elevated on admission. - Follow-up in ID clinic on 12/30 for CMV and EBV DNA, if fevers persist then would recommend referral to heme/onc.  Of note patient has had no leukocytosis, normal WBC differential.  Morphology unremarkable. -plan discussed with primary   Microbiology:   Antibiotics: Cosy 12/9-10 Pip-tazo 12/11 Vanoc 12/11-13  Cultures: Blood 12/11 Urine  Other   SUBJECTIVE: Resting in bed no new complaints Interval: Tmax 102.8 overnight  Review of Systems: Review of Systems  All other systems  reviewed and are negative.    Scheduled Meds:  doxycycline   100 mg Oral Q12H   enoxaparin  (LOVENOX ) injection  40 mg Subcutaneous Q24H   metoCLOPramide  (REGLAN ) injection  10 mg Intravenous Q8H   mirtazapine   7.5 mg Oral QHS   pantoprazole   40 mg Oral Daily   Continuous Infusions: PRN Meds:.acetaminophen  **OR** acetaminophen , ibuprofen , ondansetron  **OR** ondansetron  (ZOFRAN ) IV Allergies[1]  OBJECTIVE: Vitals:   03/25/24 0651 03/25/24 0801 03/25/24 0927 03/25/24 1211  BP:  107/65  100/61  Pulse:  (!) 116  (!) 105  Resp:  18 (!) 23 19  Temp:  (!) 102.8 F (39.3 C) 98.3 F (36.8 C) 98.2 F (36.8 C)  TempSrc:  Oral Oral Oral  SpO2:  96%  100%  Weight: 89 kg     Height:       Body mass index is 33.68 kg/m.  Physical Exam Constitutional:      Appearance: Normal appearance.  HENT:     Head: Normocephalic and atraumatic.     Right Ear: Tympanic membrane normal.     Left Ear: Tympanic membrane normal.     Nose: Nose normal.     Mouth/Throat:     Mouth: Mucous membranes are moist.  Eyes:     Extraocular Movements: Extraocular movements intact.     Conjunctiva/sclera: Conjunctivae normal.     Pupils: Pupils are equal, round, and reactive to light.  Cardiovascular:     Rate and Rhythm: Normal rate and regular rhythm.     Heart sounds: No murmur heard.    No friction rub. No gallop.  Pulmonary:  Effort: Pulmonary effort is normal.     Breath sounds: Normal breath sounds.  Abdominal:     General: Abdomen is flat.     Palpations: Abdomen is soft.  Musculoskeletal:        General: Normal range of motion.  Skin:    General: Skin is warm and dry.  Neurological:     General: No focal deficit present.     Mental Status: She is alert and oriented to person, place, and time.  Psychiatric:        Mood and Affect: Mood normal.       Lab Results Lab Results  Component Value Date   WBC 4.8 03/23/2024   HGB 11.5 (L) 03/23/2024   HCT 34.0 (L) 03/23/2024   MCV  84.2 03/23/2024   PLT 169 03/23/2024    Lab Results  Component Value Date   CREATININE 0.60 03/24/2024   BUN <5 (L) 03/24/2024   NA 142 03/24/2024   K 3.8 03/24/2024   CL 112 (H) 03/24/2024   CO2 21 (L) 03/24/2024    Lab Results  Component Value Date   ALT 45 (H) 03/19/2024   AST 40 03/19/2024   ALKPHOS 55 03/19/2024   BILITOT 0.7 03/19/2024        Loney Stank, MD [facility] 03/25/2024, 2:44 PM     [1]  Allergies Allergen Reactions   Penicillins Hives    Tolerates cephalosporins - possible allergic reaction to Zosyn  03/20/24

## 2024-03-25 NOTE — Progress Notes (Signed)
 Initial Nutrition Assessment  DOCUMENTATION CODES:   Obesity unspecified  INTERVENTION:  Continue regular diet as ordered If nausea persists, recommend small, frequent oral intake Continue Gatorade >/=64oz daily per GI  Add Ensure Plus High Protein po BID to support increased nutrition needs and to support adequate hydration, each supplement provides 350 kcal and 20 grams of protein.  NUTRITION DIAGNOSIS:   Inadequate oral intake related to acute illness, nausea as evidenced by meal completion < 25%, per patient/family report.  GOAL:   Patient will meet greater than or equal to 90% of their needs  MONITOR:   PO intake, Supplement acceptance, Labs  REASON FOR ASSESSMENT:   Consult Assessment of nutrition requirement/status  ASSESSMENT:   Pt admitted with c/o malaise, weakness, intermittent fevers, found to have UTI and possible pyelonephritis. PMH significant for intermittent reflux and migraines.  RD working remotely. Unsuccessful attempt to reach pt via phone call to room to obtain nutrition assessment.   GI consulted d/t nausea and poor oral intake.  She had been improving with improved tolerance to oral intake.  No plan for endoscopic evaluation. PPI added. Continue to encourage oral intake and at least 64 oz gatorade daily.  Zofran  added around the clock.   Seen by SLP yesterday. No s/s of dysphagia noted.    Meal completions:  12/9: 15% dinner 12/12: 10% breakfast, 0% dinner 12/14: 10% breakfast, 10% lunch  Limited weight history on file to review within the last year.  First measured weight was 89.2 kg on 12/15. Suspect prior weight of 93.4 kg has been pulled from prior weight in 2024.   Medications: abx, reglan  q8h,   Labs:  reviewed  NUTRITION - FOCUSED PHYSICAL EXAM: RD working remotely. Deferred ot follow up.   Diet Order:   Diet Order             Diet regular Room service appropriate? Yes; Fluid consistency: Thin  Diet effective now                    EDUCATION NEEDS:   No education needs have been identified at this time  Skin:  Skin Assessment: Reviewed RN Assessment  Last BM:  12/15  Height:   Ht Readings from Last 1 Encounters:  03/18/24 5' 4 (1.626 m)    Weight:   Wt Readings from Last 1 Encounters:  03/25/24 89 kg   BMI:  Body mass index is 33.68 kg/m.  Estimated Nutritional Needs:   Kcal:  1700-1900  Protein:  90-105g  Fluid:  >/=1.7L  Royce Maris, RDN, LDN Clinical Nutrition See [facility] for contact information.

## 2024-03-26 LAB — EPSTEIN BARR VRS(EBV DNA BY PCR): EBV DNA QN by PCR: NEGATIVE [IU]/mL

## 2024-03-26 LAB — CYTOMEGALOVIRUS DNA, QUANTITATIVE REAL-TIME PCR, PLASMA
CMV DNA Quant: 5790 [IU]/mL
Log10 CMV Qn DNA Pl: 3.763 {Log_IU}/mL

## 2024-03-29 ENCOUNTER — Other Ambulatory Visit (HOSPITAL_COMMUNITY): Payer: Self-pay

## 2024-04-08 ENCOUNTER — Inpatient Hospital Stay: Admitting: Infectious Diseases

## 2024-04-16 ENCOUNTER — Inpatient Hospital Stay: Payer: Self-pay | Admitting: Internal Medicine
